# Patient Record
Sex: Female | Born: 1980
Health system: Southern US, Community
[De-identification: ages and names within clinical notes are randomized; demographics above are authoritative.]

## PROBLEM LIST (undated history)

## (undated) ENCOUNTER — Emergency Department (HOSPITAL_BASED_OUTPATIENT_CLINIC_OR_DEPARTMENT_OTHER): Disposition: A | Discharge status: Home / Self Care | Source: Home / Self Care

## (undated) DIAGNOSIS — Z7689 Persons encountering health services in other specified circumstances: Secondary | ICD-10-CM

## (undated) DIAGNOSIS — F419 Anxiety disorder, unspecified: Secondary | ICD-10-CM

## (undated) DIAGNOSIS — F1010 Alcohol abuse, uncomplicated: Secondary | ICD-10-CM

## (undated) DIAGNOSIS — K219 Gastro-esophageal reflux disease without esophagitis: Secondary | ICD-10-CM

## (undated) DIAGNOSIS — F909 Attention-deficit hyperactivity disorder, unspecified type: Secondary | ICD-10-CM

## (undated) DIAGNOSIS — R569 Unspecified convulsions: Secondary | ICD-10-CM

## (undated) DIAGNOSIS — F1910 Other psychoactive substance abuse, uncomplicated: Secondary | ICD-10-CM

## (undated) DIAGNOSIS — F1110 Opioid abuse, uncomplicated: Secondary | ICD-10-CM

## (undated) DIAGNOSIS — T7840XA Allergy, unspecified, initial encounter: Secondary | ICD-10-CM

## (undated) DIAGNOSIS — F32A Depression, unspecified: Secondary | ICD-10-CM

## (undated) DIAGNOSIS — R030 Elevated blood-pressure reading, without diagnosis of hypertension: Secondary | ICD-10-CM

## (undated) DIAGNOSIS — N200 Calculus of kidney: Secondary | ICD-10-CM

## (undated) DIAGNOSIS — F329 Major depressive disorder, single episode, unspecified: Secondary | ICD-10-CM

## (undated) DIAGNOSIS — I10 Essential (primary) hypertension: Secondary | ICD-10-CM

## (undated) DIAGNOSIS — M25562 Pain in left knee: Secondary | ICD-10-CM

## (undated) DIAGNOSIS — F319 Bipolar disorder, unspecified: Secondary | ICD-10-CM

## (undated) DIAGNOSIS — G43909 Migraine, unspecified, not intractable, without status migrainosus: Secondary | ICD-10-CM

## (undated) DIAGNOSIS — IMO0001 Reserved for inherently not codable concepts without codable children: Secondary | ICD-10-CM

## (undated) DIAGNOSIS — M1990 Unspecified osteoarthritis, unspecified site: Secondary | ICD-10-CM

## (undated) DIAGNOSIS — M25561 Pain in right knee: Secondary | ICD-10-CM

## (undated) HISTORY — DX: Persons encountering health services in other specified circumstances: Z76.89

## (undated) HISTORY — DX: Pain in right knee: M25.561

## (undated) HISTORY — PX: FRACTURE SURGERY: SHX138

## (undated) HISTORY — PX: KNEE SURGERY: SHX244

## (undated) HISTORY — DX: Unspecified osteoarthritis, unspecified site: M19.90

## (undated) HISTORY — PX: SHOULDER SURGERY: SHX246

## (undated) HISTORY — DX: Alcohol abuse, uncomplicated: F10.10

## (undated) HISTORY — DX: Migraine, unspecified, not intractable, without status migrainosus: G43.909

## (undated) HISTORY — DX: Major depressive disorder, single episode, unspecified: F32.9

## (undated) HISTORY — DX: Depression, unspecified: F32.A

## (undated) HISTORY — DX: Gastro-esophageal reflux disease without esophagitis: K21.9

## (undated) HISTORY — DX: Other psychoactive substance abuse, uncomplicated: F19.10

## (undated) HISTORY — DX: Elevated blood-pressure reading, without diagnosis of hypertension: R03.0

## (undated) HISTORY — DX: Pain in left knee: M25.562

## (undated) HISTORY — DX: Reserved for inherently not codable concepts without codable children: IMO0001

## (undated) HISTORY — DX: Attention-deficit hyperactivity disorder, unspecified type: F90.9

## (undated) HISTORY — DX: Allergy, unspecified, initial encounter: T78.40XA

## (undated) HISTORY — DX: Bipolar disorder, unspecified: F31.9

## (undated) HISTORY — DX: Anxiety disorder, unspecified: F41.9

---

## 1999-02-02 ENCOUNTER — Encounter: Payer: Self-pay | Admitting: Emergency Medicine

## 1999-02-02 ENCOUNTER — Encounter: Payer: Self-pay | Admitting: Pulmonary Disease

## 1999-02-02 ENCOUNTER — Observation Stay (HOSPITAL_COMMUNITY): Admission: EM | Admit: 1999-02-02 | Discharge: 1999-02-03 | Payer: Self-pay | Admitting: Emergency Medicine

## 1999-02-03 ENCOUNTER — Encounter: Payer: Self-pay | Admitting: Pulmonary Disease

## 1999-10-13 ENCOUNTER — Encounter: Payer: Self-pay | Admitting: Thoracic Surgery (Cardiothoracic Vascular Surgery)

## 1999-10-13 ENCOUNTER — Encounter
Admission: RE | Admit: 1999-10-13 | Discharge: 1999-10-13 | Payer: Self-pay | Admitting: Thoracic Surgery (Cardiothoracic Vascular Surgery)

## 2000-07-05 ENCOUNTER — Other Ambulatory Visit: Admission: RE | Admit: 2000-07-05 | Discharge: 2000-07-05 | Payer: Self-pay | Admitting: Obstetrics and Gynecology

## 2000-08-16 ENCOUNTER — Encounter (INDEPENDENT_AMBULATORY_CARE_PROVIDER_SITE_OTHER): Payer: Self-pay

## 2000-08-16 ENCOUNTER — Other Ambulatory Visit: Admission: RE | Admit: 2000-08-16 | Discharge: 2000-08-16 | Payer: Self-pay | Admitting: Obstetrics and Gynecology

## 2002-01-11 ENCOUNTER — Other Ambulatory Visit: Admission: RE | Admit: 2002-01-11 | Discharge: 2002-01-11 | Payer: Self-pay | Admitting: Obstetrics and Gynecology

## 2003-10-26 ENCOUNTER — Ambulatory Visit (HOSPITAL_BASED_OUTPATIENT_CLINIC_OR_DEPARTMENT_OTHER): Admission: RE | Admit: 2003-10-26 | Discharge: 2003-10-26 | Payer: Self-pay | Admitting: Orthopedic Surgery

## 2004-05-19 ENCOUNTER — Emergency Department (HOSPITAL_COMMUNITY): Admission: EM | Admit: 2004-05-19 | Discharge: 2004-05-19 | Payer: Self-pay | Admitting: Emergency Medicine

## 2004-11-26 ENCOUNTER — Other Ambulatory Visit (HOSPITAL_COMMUNITY): Admission: RE | Admit: 2004-11-26 | Discharge: 2004-12-22 | Payer: Self-pay | Admitting: Psychiatry

## 2004-11-26 ENCOUNTER — Ambulatory Visit: Payer: Self-pay | Admitting: Psychiatry

## 2004-12-23 ENCOUNTER — Other Ambulatory Visit (HOSPITAL_COMMUNITY): Admission: RE | Admit: 2004-12-23 | Discharge: 2005-03-23 | Payer: Self-pay | Admitting: Psychiatry

## 2004-12-26 ENCOUNTER — Emergency Department (HOSPITAL_COMMUNITY): Admission: EM | Admit: 2004-12-26 | Discharge: 2004-12-27 | Payer: Self-pay | Admitting: Emergency Medicine

## 2006-05-02 ENCOUNTER — Emergency Department (HOSPITAL_COMMUNITY): Admission: EM | Admit: 2006-05-02 | Discharge: 2006-05-02 | Payer: Self-pay | Admitting: Emergency Medicine

## 2006-11-16 ENCOUNTER — Emergency Department (HOSPITAL_COMMUNITY): Admission: EM | Admit: 2006-11-16 | Discharge: 2006-11-17 | Payer: Self-pay | Admitting: Emergency Medicine

## 2006-11-27 ENCOUNTER — Ambulatory Visit: Payer: Self-pay | Admitting: Psychiatry

## 2006-11-27 ENCOUNTER — Emergency Department (HOSPITAL_COMMUNITY): Admission: EM | Admit: 2006-11-27 | Discharge: 2006-11-28 | Payer: Self-pay | Admitting: Emergency Medicine

## 2006-11-28 ENCOUNTER — Inpatient Hospital Stay (HOSPITAL_COMMUNITY): Admission: EM | Admit: 2006-11-28 | Discharge: 2006-11-28 | Payer: Self-pay | Admitting: Psychiatry

## 2010-02-03 ENCOUNTER — Encounter: Payer: Self-pay | Admitting: Family Medicine

## 2010-06-17 ENCOUNTER — Ambulatory Visit (HOSPITAL_COMMUNITY): Payer: Self-pay | Admitting: Psychology

## 2010-06-24 ENCOUNTER — Ambulatory Visit (HOSPITAL_COMMUNITY): Payer: Self-pay | Admitting: Psychology

## 2010-07-01 ENCOUNTER — Ambulatory Visit (HOSPITAL_COMMUNITY): Payer: Self-pay | Admitting: Psychology

## 2010-07-09 ENCOUNTER — Ambulatory Visit (HOSPITAL_COMMUNITY): Payer: Self-pay | Admitting: Psychiatry

## 2010-07-10 ENCOUNTER — Ambulatory Visit (HOSPITAL_COMMUNITY): Payer: Self-pay | Admitting: Psychology

## 2010-07-16 ENCOUNTER — Ambulatory Visit (HOSPITAL_COMMUNITY): Payer: Self-pay | Admitting: Psychology

## 2010-07-23 ENCOUNTER — Ambulatory Visit (HOSPITAL_COMMUNITY): Payer: Self-pay | Admitting: Psychology

## 2010-07-30 ENCOUNTER — Ambulatory Visit (HOSPITAL_COMMUNITY): Payer: Self-pay | Admitting: Psychology

## 2010-08-06 ENCOUNTER — Ambulatory Visit (HOSPITAL_COMMUNITY): Payer: Self-pay | Admitting: Psychology

## 2010-08-13 ENCOUNTER — Ambulatory Visit (HOSPITAL_COMMUNITY): Payer: Self-pay | Admitting: Psychology

## 2010-08-20 ENCOUNTER — Ambulatory Visit (HOSPITAL_COMMUNITY): Payer: Self-pay | Admitting: Psychology

## 2010-08-20 ENCOUNTER — Ambulatory Visit: Payer: Self-pay | Admitting: Family Medicine

## 2010-08-20 DIAGNOSIS — F1910 Other psychoactive substance abuse, uncomplicated: Secondary | ICD-10-CM | POA: Insufficient documentation

## 2010-08-20 DIAGNOSIS — M25569 Pain in unspecified knee: Secondary | ICD-10-CM | POA: Insufficient documentation

## 2010-08-20 DIAGNOSIS — R300 Dysuria: Secondary | ICD-10-CM | POA: Insufficient documentation

## 2010-08-20 DIAGNOSIS — F341 Dysthymic disorder: Secondary | ICD-10-CM | POA: Insufficient documentation

## 2010-08-20 DIAGNOSIS — N390 Urinary tract infection, site not specified: Secondary | ICD-10-CM | POA: Insufficient documentation

## 2010-08-20 DIAGNOSIS — R030 Elevated blood-pressure reading, without diagnosis of hypertension: Secondary | ICD-10-CM | POA: Insufficient documentation

## 2010-08-20 DIAGNOSIS — F909 Attention-deficit hyperactivity disorder, unspecified type: Secondary | ICD-10-CM | POA: Insufficient documentation

## 2010-08-20 LAB — CONVERTED CEMR LAB
Bilirubin Urine: NEGATIVE
Epithelial cells, urine: >20 /lpf
Glucose, Urine, Semiquant: NEGATIVE
Ketones, urine, test strip: NEGATIVE
Nitrite: POSITIVE
RBC / HPF: >20
Specific Gravity, Urine: >=1.03
Urobilinogen, UA: 0.2
pH: 5.5

## 2010-08-21 ENCOUNTER — Telehealth (INDEPENDENT_AMBULATORY_CARE_PROVIDER_SITE_OTHER): Payer: Self-pay | Admitting: *Deleted

## 2010-08-22 ENCOUNTER — Telehealth: Payer: Self-pay | Admitting: Family Medicine

## 2010-08-22 ENCOUNTER — Telehealth (INDEPENDENT_AMBULATORY_CARE_PROVIDER_SITE_OTHER): Payer: Self-pay | Admitting: *Deleted

## 2010-08-27 ENCOUNTER — Ambulatory Visit (HOSPITAL_COMMUNITY): Payer: Self-pay | Admitting: Psychology

## 2010-09-03 ENCOUNTER — Ambulatory Visit (HOSPITAL_COMMUNITY): Payer: Self-pay | Admitting: Psychology

## 2010-09-10 ENCOUNTER — Ambulatory Visit (HOSPITAL_COMMUNITY): Payer: Self-pay | Admitting: Psychology

## 2010-09-18 ENCOUNTER — Ambulatory Visit (HOSPITAL_COMMUNITY): Payer: Self-pay | Admitting: Psychology

## 2010-10-23 ENCOUNTER — Ambulatory Visit (HOSPITAL_COMMUNITY): Payer: Self-pay | Admitting: Psychology

## 2010-10-23 ENCOUNTER — Ambulatory Visit (HOSPITAL_COMMUNITY)
Admission: RE | Admit: 2010-10-23 | Discharge: 2010-10-23 | Payer: Self-pay | Source: Home / Self Care | Admitting: Psychiatry

## 2010-10-27 ENCOUNTER — Other Ambulatory Visit (HOSPITAL_COMMUNITY)
Admission: RE | Admit: 2010-10-27 | Discharge: 2010-12-15 | Payer: Self-pay | Source: Home / Self Care | Attending: Psychiatry | Admitting: Psychiatry

## 2010-11-25 ENCOUNTER — Ambulatory Visit (HOSPITAL_COMMUNITY): Payer: Self-pay | Admitting: Psychiatry

## 2010-12-18 ENCOUNTER — Ambulatory Visit (HOSPITAL_COMMUNITY)
Admission: RE | Admit: 2010-12-18 | Discharge: 2010-12-18 | Payer: Self-pay | Source: Home / Self Care | Attending: Psychology | Admitting: Psychology

## 2010-12-25 ENCOUNTER — Ambulatory Visit (HOSPITAL_COMMUNITY)
Admission: RE | Admit: 2010-12-25 | Discharge: 2010-12-25 | Payer: Self-pay | Source: Home / Self Care | Attending: Psychology | Admitting: Psychology

## 2011-01-01 ENCOUNTER — Ambulatory Visit (HOSPITAL_COMMUNITY): Admit: 2011-01-01 | Payer: Self-pay | Admitting: Psychology

## 2011-01-06 ENCOUNTER — Ambulatory Visit
Admission: RE | Admit: 2011-01-06 | Discharge: 2011-01-06 | Payer: Self-pay | Source: Home / Self Care | Attending: Family Medicine | Admitting: Family Medicine

## 2011-01-06 LAB — CONVERTED CEMR LAB
Glucose, Urine, Semiquant: NEGATIVE
Ketones, urine, test strip: NEGATIVE
Specific Gravity, Urine: >=1.03
WBC Urine, dipstick: NEGATIVE
pH: 5.5

## 2011-01-06 NOTE — Progress Notes (Signed)
  Phone Note Call from Patient   Caller: Patient Call For: [phone] Summary of Call: Hasn't had a bm in 2 wks.  Took Citric Magnesium and still haven't had any results.  Call [phone]  at home or cell number above. Initial call taken by: Britta Mccreedy mcgregor  Follow-up for Phone Call        eats a fiber bar each day. hx of constipation & not going regularly. told her she needed more fiber. may take miralx or similar product 3 times today. she states she is going to take another laxative. states she is cramping.  discussed long term efforts to prevent this. eat more fruits & veggies. fiber daily, along with her fiber bar. increase activity & water intake asked her to call me if no activity by 3 today Follow-up by: Golden Circle RN,  August 22, 2010 8:50 AM

## 2011-01-06 NOTE — Assessment & Plan Note (Signed)
Summary: NP,tcb   Vital Signs:  Patient profile:   30 year old female Height:      67 inches Weight:      145 pounds BMI:     22.79 Temp:     99.2 degrees F oral Pulse rate:   76 / minute BP sitting:   144 / 78  (left arm) Cuff size:   large  Vitals Entered By: Tessie Fass CMA (August 20, 2010 10:37 AM) CC: NEW PT Is Patient Diabetic? No Pain Assessment Patient in pain? yes     Location: bilateral knee Intensity: 5   Primary Care Provider:  Helane Rima DO  CC:  NEW PT.  History of Present Illness: 30 yo F. Today, she would like to discuss:  1. Knee Pain: Bilateral, chronic, PMhx patellofemoral syndrome, was seen by [facility] 03/2010 - xrays with no other changes, has PT exercises that she does intermittently. States that she was previously on Celexa, but told to stop 2/2 HTN and then Vicodin per her previous PCP for hs pain.  2. BP: Has been borderline for "a long time." Believes that it is 2/2 weight gain. She has not been exercising or eating a healthy diet. She was Rx Clonidine for this and to also help with anxiety. She denies HA, vision changes, CP, SOB, LE edema, rash.  3. Anxiety: Previously followed by Dr. Evelene Croon. Endorses Rx [employer]. States that she has one more Rx that she can fill before running out. She works with a Paramedic, Furniture conservator/restorer, weekly at [employer]. She denies SI/HI.  4. Dysuria: Associated with dark, foul-smelling urine x a couple of weeks. Denies fever/chills, N/V, abdominal pain, back pain.  Habits & Providers  Alcohol-Tobacco-Diet     Tobacco Status: current     Tobacco Counseling: to quit use of tobacco products     Cigarette Packs/Day: <0.25  Current Medications (verified): 1)  Tramadol Hcl 50 Mg  Tabs (Tramadol Hcl) .... One By Mouth Two Times A Day As Needed For Knee Pain 2)  Cephalexin 500 Mg  Tabs (Cephalexin) .... Take One By Mouth Two Times A Day X 3 Days 3)  Alprazolam 0.5 Mg Tabs (Alprazolam) .... One By Mouth Two Times A Day - Prescribed  Previously By Dr. Evelene Croon  Allergies (verified): No Known Drug Allergies  Past History:  Past Medical History: Psychiatric - Anxiety/Depression, ADHD, ? Bipolar Disorder   - Previously followed by Dr. Evelene Croon   - Records Reviewed - patient confirmed to be taking Xanax 0.5 mg by mouth two times a day    - Other medications Rx by Dr. Evelene Croon at time of last visit: Saphris 5 mg by mouth daily, Benztropine 1 mg by mouth daily, and Vyvanse 70 mg by mouth daily. Patient not taking any of these medications at time of first OV.    - Previous [facility] H&P notes PMHx Schizophrenia, though patient denies this. Borderline HTN Polysubstance Abuse    - ETOH, cocaine, THC, heroin, narcotics Previous PCP - Dr. Duaine Dredge Migraines Chronic Bilateral Knee Pain    - patellofemoral syndrome    - was [facility] by [facility]  Past Surgical History: Hx grade 4 AC separation Hx 8th rib fx  Family History: Mother is 52 - alive and well. Father is 49 - alive and well.  Social History: Lives in [place], alone. No pets. Endorses a healthy diet and intermittent exercise. 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 High school graduate. Single. Smokes 1-2 cigarettes  per day. Denies recreational drug use. Denies ETOH use. Smoking Status:  current Packs/Day:  <0.25  Review of Systems      See HPI  Physical Exam  General:  Well-developed, well-nourished, in no acute distress; alert, appropriate and cooperative throughout examination. Vitals reviewed. Psych:  Oriented X3, memory intact for recent and remote, good eye contact, and moderately anxious.     Impression & Recommendations:  Problem # 1:  PATELLO-FEMORAL SYNDROME (ICD-719.46) Assessment Unchanged Chronic issue. Tylenol not working. No NSAIDs today 2/2 BP. Patient requested narcotic as prescribed by previous PCP. Will Rx Ultram today. Will request records from previous PCP and [facility]. Her updated medication list for this problem includes:    Tramadol Hcl  50 Mg Tabs (Tramadol hcl) ..... One by mouth two times a day as needed for knee pain  Problem # 2:  ELEVATED BLOOD PRESSURE WITHOUT DIAGNOSIS OF HYPERTENSION (ICD-796.2) Assessment: Unchanged Patient does not wish to continue Clonidine. She does not think that it has helped her ADHD. I agree that it is not a good choice if we are only using it for HTN now. Patient given instructions to wean. Will follow up in 1-2 weeks for recheck of BP.  Problem # 3:  ANXIETY DEPRESSION (ICD-300.4) Assessment: Unchanged Patient with significant psychiatric Hx. Will request records from Dr. Evelene Croon.  Problem # 4:  DYSURIA (ICD-788.1) Assessment: New  Her updated medication list for this problem includes:    Cephalexin 500 Mg Tabs (Cephalexin) .Marland Kitchen... Take one by mouth two times a day x 3 days  Problem # 5:  UTI (ICD-599.0) Assessment: New  Her updated medication list for this problem includes:    Cephalexin 500 Mg Tabs (Cephalexin) .Marland Kitchen... Take one by mouth two times a day x 3 days  Problem # 6:  SUBSTANCE ABUSE, MULTIPLE (ICD-305.90) Assessment: Improved Long Hx of plysubstance abuse - see PMHx. Will monitor for signs of relapse or abuse of prescribed medications.  Complete Medication List: 1)  Tramadol Hcl 50 Mg Tabs (Tramadol hcl) .... One by mouth two times a day as needed for knee pain 2)  Cephalexin 500 Mg Tabs (Cephalexin) .... Take one by mouth two times a day x 3 days 3)  Alprazolam 0.5 Mg Tabs (Alprazolam) .... One by mouth two times a day - prescribed previously by dr. Evelene Croon  Other Orders: Urinalysis-[facility] (00000)  Patient Instructions: 1)  It was very nice to meet you today! 2)  We are requesting records from your previous doctors. 3)  Follow up in 1 month or less. 4)  To wean Clonidine: Decrease to 1/2 tab two times a day x 5 days, then 1/2 tab by mouth daily x 5 days, then off. Prescriptions: TRAMADOL HCL 50 MG  TABS (TRAMADOL HCL) one by mouth two times a day as needed for knee pain  #60  x 0   Entered and Authorized by:   Helane Rima DO   Signed by:   Helane Rima DO on 08/20/2010   Method used:   Electronically to        [facility]  [phone]* (retail)       [address] [place], [place]  [address]       Ph: [phone] or [phone]       Fax: [phone]   RxID:   [id number]   Laboratory Results   Urine Tests  Date/Time Received: August 20, 2010 11:26 AM  Date/Time Reported: August 20, 2010 12:07  PM   Routine Urinalysis   Color: yellow Appearance: Cloudy Glucose: negative   (Normal Range: Negative) Bilirubin: negative   (Normal Range: Negative) Ketone: negative   (Normal Range: Negative) Spec. Gravity: >=1.030   (Normal Range: 1.003-1.035) Blood: moderate   (Normal Range: Negative) pH: 5.5   (Normal Range: 5.0-8.0) Protein: trace   (Normal Range: Negative) Urobilinogen: 0.2   (Normal Range: 0-1) Nitrite: positive   (Normal Range: Negative) Leukocyte Esterace: large   (Normal Range: Negative)  Urine Microscopic WBC/HPF: LOADED RBC/HPF: >20 Bacteria/HPF: LOADED Epithelial/HPF: >20    Comments: ...............test performed by......Marland KitchenBonnie A. [place]azil[place], MLS (ASCP)cm      Prevention & Chronic Care Immunizations   Influenza vaccine: Not documented   Influenza vaccine deferral: Deferred  (08/20/2010)    Tetanus booster: Not documented    Pneumococcal vaccine: Not documented  Other Screening   Pap smear: Not documented   Pap smear due: 07/08/2011   Smoking status: current  (08/20/2010)   Smoking cessation counseling: yes  (08/20/2010)

## 2011-01-06 NOTE — Consult Note (Signed)
Summary: [facility]  [facility]   Imported By: Clydell Hakim 08/28/2010 15:41:47  _____________________________________________________________________  External Attachment:    Type:   Image     Comment:   External Document

## 2011-01-06 NOTE — Progress Notes (Signed)
  Phone Note Call from Patient   Call For: [phone] Summary of Call: Would like to have to have lab results.  Was told yesterday they would be ready today.  Please call asap.   Initial call taken by: Britta Mccreedy mcgregor  Follow-up for Phone Call        will forward  to MD. Follow-up by: Theresia Lo RN,  August 21, 2010 11:46 AM  Additional Follow-up for Phone Call Additional follow up Details #1::        please call patient to let her know that her urine is consistent with a urinary tract infection - i sent cephalexin to the pharmacy. Additional Follow-up by: Helane Rima DO,  August 21, 2010 4:07 PM    Additional Follow-up for Phone Call Additional follow up Details #2::    message left on voicemail to call tomorrow AM. Follow-up by: Theresia Lo RN,  August 21, 2010 4:57 PM  New/Updated Medications: CEPHALEXIN 500 MG  TABS (CEPHALEXIN) take one by mouth two times a day x 3 days Prescriptions: CEPHALEXIN 500 MG  TABS (CEPHALEXIN) take one by mouth two times a day x 3 days  #6 x 0   Entered by:   Helane Rima DO   Authorized by:   . RN TEAM-[facility]   Signed by:   Helane Rima DO on 08/21/2010   Method used:   Telephoned to ...         RxID:   [id number]   patient notified . Theresia Lo RN  August 22, 2010 9:56 AM

## 2011-01-06 NOTE — Progress Notes (Signed)
Summary: Rx  Phone Note Call from Patient Call back at Home Phone [phone]   Reason for Call: Talk to Nurse Summary of Call: pt sts the Rx for tramadol is still not at the pharmacy? Initial call taken by: Knox Royalty,  August 22, 2010 1:37 PM  Follow-up for Phone Call        patient is asking about RX for antibiotic. it was not recived at pharmacy. Rx called to pharmacy and patient notified. Follow-up by: Theresia Lo RN,  August 22, 2010 2:11 PM

## 2011-01-10 ENCOUNTER — Encounter: Payer: Self-pay | Admitting: *Deleted

## 2011-01-12 ENCOUNTER — Encounter (HOSPITAL_COMMUNITY): Payer: BC Managed Care – PPO | Admitting: Psychology

## 2011-01-12 DIAGNOSIS — F39 Unspecified mood [affective] disorder: Secondary | ICD-10-CM

## 2011-01-14 NOTE — Assessment & Plan Note (Signed)
Summary: possible kidney infection?/eo   Vital Signs:  Patient profile:   30 year old female Height:      67 inches Weight:      198 pounds BMI:     31.12 Temp:     98.2 degrees F oral Pulse rate:   79 / minute BP sitting:   121 / 79  (left arm) Cuff size:   regular  Vitals Entered By: Tessie Fass CMA (January 06, 2011 2:26 PM) CC: dysuria, lower abdominal and back pain x 2 days Pain Assessment Patient in pain? yes     Location: lower back, abdomen Intensity: 7   Primary Care Provider:  Helane Rima DO  CC:  dysuria and lower abdominal and back pain x 2 days.  History of Present Illness: Urine dark and has an odor.  Burns a little when she voids.  Very poor historian and almost got angry when questions were asked.  Allergies: No Known Drug Allergies  Review of Systems General:  Denies chills and fever. GI:  Complains of abdominal pain. GU:  Complains of dysuria; denies urinary frequency and urinary hesitancy.  Physical Exam  General:  Depressed appearing Abdomen:  soft and non-tender.  No CVA tenderness   Impression & Recommendations:  Problem # 1:  DYSURIA (ICD-788.1) Patient became very upset when I expalined that her urine was concentrated and no signs of infection.  It was not a good sample but her WBC were rare.  She said "I paid $25 to find out I need to drink more water"  then asked for a work note for leaving work today and left without instructions. Orders: Urinalysis-[facility] (00000) [facility]- Est Level  2 (74259)  Complete Medication List: 1)  Tramadol Hcl 50 Mg Tabs (Tramadol hcl) .... One by mouth two times a day as needed for knee pain 2)  Alprazolam 0.5 Mg Tabs (Alprazolam) .... One by mouth two times a day - prescribed previously by dr. Evelene Croon   Orders Added: 1)  Urinalysis-[facility] [00000] 2)  [facility]- Est Level  2 [99212]    Laboratory Results   Urine Tests  Date/Time Received: January 06, 2011 2:28 PM  Date/Time Reported: January 06, 2011 3:24  PM   Routine Urinalysis   Color: yellow Appearance: Clear Glucose: negative   (Normal Range: Negative) Bilirubin: negative   (Normal Range: Negative) Ketone: negative   (Normal Range: Negative) Spec. Gravity: >=1.030   (Normal Range: 1.003-1.035) Blood: small   (Normal Range: Negative) pH: 5.5   (Normal Range: 5.0-8.0) Protein: negative   (Normal Range: Negative) Urobilinogen: 0.2   (Normal Range: 0-1) Nitrite: negative   (Normal Range: Negative) Leukocyte Esterace: negative   (Normal Range: Negative)  Urine Microscopic WBC/HPF: 4-8 RBC/HPF: 0-3 Bacteria/HPF: 2+ Epithelial/HPF: >20    Comments: ...............test performed by......Marland KitchenBonnie A. [place], MLS (ASCP)cm

## 2011-01-20 ENCOUNTER — Encounter (HOSPITAL_COMMUNITY): Payer: BC Managed Care – PPO | Admitting: Psychology

## 2011-01-27 ENCOUNTER — Encounter (HOSPITAL_COMMUNITY): Payer: BC Managed Care – PPO | Admitting: Psychology

## 2011-02-04 ENCOUNTER — Encounter (HOSPITAL_COMMUNITY): Payer: BC Managed Care – PPO | Admitting: Psychology

## 2011-02-04 DIAGNOSIS — F3132 Bipolar disorder, current episode depressed, moderate: Secondary | ICD-10-CM

## 2011-02-04 DIAGNOSIS — F1120 Opioid dependence, uncomplicated: Secondary | ICD-10-CM

## 2011-02-08 ENCOUNTER — Emergency Department (HOSPITAL_COMMUNITY)
Admission: EM | Admit: 2011-02-08 | Discharge: 2011-02-08 | Disposition: A | Discharge status: Home / Self Care | Payer: BC Managed Care – PPO | Attending: Emergency Medicine | Admitting: Emergency Medicine

## 2011-02-08 DIAGNOSIS — Z9889 Other specified postprocedural states: Secondary | ICD-10-CM | POA: Insufficient documentation

## 2011-02-08 DIAGNOSIS — I10 Essential (primary) hypertension: Secondary | ICD-10-CM | POA: Insufficient documentation

## 2011-02-08 DIAGNOSIS — M25569 Pain in unspecified knee: Secondary | ICD-10-CM | POA: Insufficient documentation

## 2011-02-11 ENCOUNTER — Encounter (HOSPITAL_COMMUNITY): Payer: BC Managed Care – PPO | Admitting: Psychology

## 2011-02-11 DIAGNOSIS — F3132 Bipolar disorder, current episode depressed, moderate: Secondary | ICD-10-CM

## 2011-02-11 DIAGNOSIS — F1120 Opioid dependence, uncomplicated: Secondary | ICD-10-CM

## 2011-02-13 ENCOUNTER — Other Ambulatory Visit: Payer: Self-pay | Admitting: Orthopedic Surgery

## 2011-02-13 ENCOUNTER — Ambulatory Visit
Admission: RE | Admit: 2011-02-13 | Discharge: 2011-02-13 | Disposition: A | Discharge status: Home / Self Care | Payer: BC Managed Care – PPO | Source: Ambulatory Visit | Attending: Orthopedic Surgery | Admitting: Orthopedic Surgery

## 2011-02-13 DIAGNOSIS — M25561 Pain in right knee: Secondary | ICD-10-CM

## 2011-02-16 LAB — URINE DRUGS OF ABUSE SCREEN W ALC, ROUTINE (REF LAB)
Amphetamine Screen, Ur: NEGATIVE
Amphetamine Screen, Ur: NEGATIVE
Barbiturate Quant, Ur: NEGATIVE
Barbiturate Quant, Ur: NEGATIVE
Benzodiazepines.: NEGATIVE
Benzodiazepines.: NEGATIVE
Benzodiazepines.: NEGATIVE
Benzodiazepines.: NEGATIVE
Marijuana Metabolite: NEGATIVE
Marijuana Metabolite: NEGATIVE
Marijuana Metabolite: NEGATIVE
Marijuana Metabolite: NEGATIVE
Methadone: NEGATIVE
Methadone: NEGATIVE
Methadone: NEGATIVE
Methadone: NEGATIVE
Opiate Screen, Urine: NEGATIVE
Opiate Screen, Urine: NEGATIVE
Opiate Screen, Urine: NEGATIVE
Propoxyphene: NEGATIVE
Propoxyphene: NEGATIVE
Propoxyphene: NEGATIVE
Propoxyphene: NEGATIVE

## 2011-02-17 ENCOUNTER — Other Ambulatory Visit: Payer: Self-pay | Admitting: Orthopedic Surgery

## 2011-02-17 DIAGNOSIS — M25569 Pain in unspecified knee: Secondary | ICD-10-CM

## 2011-02-17 LAB — URINE DRUGS OF ABUSE SCREEN W ALC, ROUTINE (REF LAB)
Amphetamine Screen, Ur: NEGATIVE
Amphetamine Screen, Ur: NEGATIVE
Barbiturate Quant, Ur: NEGATIVE
Barbiturate Quant, Ur: NEGATIVE
Benzodiazepines.: NEGATIVE
Benzodiazepines.: NEGATIVE
Ethyl Alcohol: <10 mg/dL (ref <10)
Marijuana Metabolite: NEGATIVE
Marijuana Metabolite: NEGATIVE
Methadone: NEGATIVE
Opiate Screen, Urine: NEGATIVE
Opiate Screen, Urine: NEGATIVE
Propoxyphene: NEGATIVE
Propoxyphene: NEGATIVE

## 2011-02-18 ENCOUNTER — Encounter (HOSPITAL_COMMUNITY): Payer: BC Managed Care – PPO | Admitting: Psychology

## 2011-02-18 DIAGNOSIS — F1120 Opioid dependence, uncomplicated: Secondary | ICD-10-CM

## 2011-02-18 DIAGNOSIS — F3130 Bipolar disorder, current episode depressed, mild or moderate severity, unspecified: Secondary | ICD-10-CM

## 2011-02-19 ENCOUNTER — Ambulatory Visit
Admission: RE | Admit: 2011-02-19 | Discharge: 2011-02-19 | Disposition: A | Discharge status: Home / Self Care | Payer: BC Managed Care – PPO | Source: Ambulatory Visit | Attending: Orthopedic Surgery | Admitting: Orthopedic Surgery

## 2011-02-19 DIAGNOSIS — M25569 Pain in unspecified knee: Secondary | ICD-10-CM

## 2011-02-25 ENCOUNTER — Encounter (HOSPITAL_COMMUNITY): Payer: BC Managed Care – PPO | Admitting: Psychology

## 2011-03-04 ENCOUNTER — Encounter (HOSPITAL_COMMUNITY): Payer: BC Managed Care – PPO | Admitting: Psychology

## 2011-03-11 ENCOUNTER — Encounter (HOSPITAL_COMMUNITY): Payer: BC Managed Care – PPO | Admitting: Psychology

## 2011-03-11 DIAGNOSIS — F319 Bipolar disorder, unspecified: Secondary | ICD-10-CM

## 2011-03-18 ENCOUNTER — Encounter (HOSPITAL_COMMUNITY): Payer: BC Managed Care – PPO | Admitting: Psychology

## 2011-03-25 ENCOUNTER — Encounter (HOSPITAL_COMMUNITY): Payer: BC Managed Care – PPO | Admitting: Psychology

## 2011-03-25 DIAGNOSIS — F1120 Opioid dependence, uncomplicated: Secondary | ICD-10-CM

## 2011-03-25 DIAGNOSIS — F3112 Bipolar disorder, current episode manic without psychotic features, moderate: Secondary | ICD-10-CM

## 2011-04-01 ENCOUNTER — Encounter (HOSPITAL_COMMUNITY): Payer: BC Managed Care – PPO | Admitting: Psychology

## 2011-04-01 DIAGNOSIS — F1120 Opioid dependence, uncomplicated: Secondary | ICD-10-CM

## 2011-04-01 DIAGNOSIS — F319 Bipolar disorder, unspecified: Secondary | ICD-10-CM

## 2011-04-08 ENCOUNTER — Encounter (HOSPITAL_COMMUNITY): Payer: BC Managed Care – PPO | Admitting: Psychology

## 2011-04-08 DIAGNOSIS — F1120 Opioid dependence, uncomplicated: Secondary | ICD-10-CM

## 2011-04-08 DIAGNOSIS — F39 Unspecified mood [affective] disorder: Secondary | ICD-10-CM

## 2011-04-15 ENCOUNTER — Encounter (HOSPITAL_COMMUNITY): Payer: BC Managed Care – PPO | Admitting: Psychology

## 2011-04-15 DIAGNOSIS — F1120 Opioid dependence, uncomplicated: Secondary | ICD-10-CM

## 2011-04-15 DIAGNOSIS — F319 Bipolar disorder, unspecified: Secondary | ICD-10-CM

## 2011-04-17 ENCOUNTER — Encounter (HOSPITAL_COMMUNITY): Payer: BC Managed Care – PPO | Admitting: Physician Assistant

## 2011-04-21 ENCOUNTER — Other Ambulatory Visit (HOSPITAL_COMMUNITY)
Admission: RE | Admit: 2011-04-21 | Discharge: 2011-04-21 | Disposition: A | Discharge status: Home / Self Care | Payer: BC Managed Care – PPO | Source: Ambulatory Visit | Attending: Family Medicine | Admitting: Family Medicine

## 2011-04-21 ENCOUNTER — Ambulatory Visit (INDEPENDENT_AMBULATORY_CARE_PROVIDER_SITE_OTHER): Payer: BC Managed Care – PPO | Admitting: Family Medicine

## 2011-04-21 ENCOUNTER — Encounter: Payer: Self-pay | Admitting: Family Medicine

## 2011-04-21 DIAGNOSIS — G5603 Carpal tunnel syndrome, bilateral upper limbs: Secondary | ICD-10-CM | POA: Insufficient documentation

## 2011-04-21 DIAGNOSIS — Z124 Encounter for screening for malignant neoplasm of cervix: Secondary | ICD-10-CM

## 2011-04-21 DIAGNOSIS — N760 Acute vaginitis: Secondary | ICD-10-CM | POA: Insufficient documentation

## 2011-04-21 DIAGNOSIS — Z01419 Encounter for gynecological examination (general) (routine) without abnormal findings: Secondary | ICD-10-CM | POA: Insufficient documentation

## 2011-04-21 DIAGNOSIS — G5600 Carpal tunnel syndrome, unspecified upper limb: Secondary | ICD-10-CM

## 2011-04-21 LAB — POCT WET PREP (WET MOUNT): Trichomonas Wet Prep HPF POC: NEGATIVE

## 2011-04-21 NOTE — Patient Instructions (Signed)
Carpal Tunnel - Use the wrist splints every night. Do the stretching exercise that I demonstrated. Take Motrin 600 mg by mouth three times daily (with food). Apply ice for 10 minutes three times daily.  Carpal Tunnel Syndrome The carpal tunnel is a narrow hollow area in the wrist. It is formed by the wrist bones and ligaments. Nerves, blood vessels, and tendons (cord like structures which attach muscle to bone) on the palm side (the side of your hand in the direction your fingers bend) of your hand pass through the carpal tunnel. Repeated wrist motion or certain diseases may cause swelling within the tunnel. (That is why these are called repetitive trauma (damage caused by over use) disorders. It is also a common problem in late pregnancy.) This swelling pinches the main nerve in the wrist (median nerve) and causes the painful condition called carpal tunnel syndrome. A feeling of "pins and needles" may be noticed in the fingers or hand; however, the entire arm may ache from this condition. Carpal tunnel syndrome may clear up by itself. Cortisone injections may help. Sometimes, an operation may be needed to free the pinched nerve. An electromyogram (a type of test) may be needed to confirm this diagnosis (learning what is wrong). This is a test which measures nerve conduction. The nerve conduction is usually slowed in a carpal tunnel syndrome. HOME CARE INSTRUCTIONS  If your caregiver prescribed medication to help reduce swelling, take as directed.   If you were given a splint to keep your wrist from bending, use it as instructed. It is important to wear the splint at night. Use the splint for as long as you have pain or numbness in your hand, arm or wrist. This may take 1 to 2 months.   If you have pain at night, it may help to rub or shake your hand, or elevate your hand above the level of your heart (the center of your chest).   It is important to give your wrist a rest by stopping the activities  that are causing the problem. If your symptoms (problems) are work-related, you may need to talk to your employer about changing to a job that does not require using your wrist.   Only take over-the-counter or prescription medicines for pain, discomfort, or fever as directed by your caregiver.   Following periods of extended use, particularly strenuous use, apply an ice pack wrapped in a towel to the anterior (palm) side of the affected wrist for 20 to 30 minutes. Repeat as needed three to four times per day. This will help reduce the swelling.   Follow all instructions for follow-up with your caregiver. This includes any orthopedic referrals, physical therapy, and rehabilitation. Any delay in obtaining necessary care could result in a delay or failure of your condition to heal.  SEEK IMMEDIATE MEDICAL CARE IF:  You are still having pain and numbness following a week of treatment.   You develop new, unexplained symptoms.   Your current symptoms are getting worse and are not helped or controlled with medications.  MAKE SURE YOU:   Understand these instructions.   Will watch your condition.   Will get help right away if you are not doing well or get worse.  Document Released: 11/20/2000 Document Re-Released: 02/19/2009 [facility] Patient Information 2011 [place], [place].

## 2011-04-21 NOTE — Assessment & Plan Note (Signed)
Discussed Dx, Tx, and future prevention.

## 2011-04-21 NOTE — Progress Notes (Signed)
  Subjective:    Patient ID: [name], female    DOB: January 17, 1981, 30 y.o.   MRN: [id number]  HPI  1. Vaginal DC: White, x several weeks, with odor, no abdominal pain/dysuria/back pain/fever.  2. Wrist/Hand Numbness/Tingling: x several months, worse at night - wakes her, bilateral, never happened before, no point pain, no weakness or dropping items, not taking anything to treat.  Review of Systems SEE HPI.    Objective:   Physical Exam  Vitals reviewed. Constitutional: She appears well-developed and well-nourished. No distress.  Abdominal: Soft. Bowel sounds are normal. She exhibits no mass.  Genitourinary: Uterus normal. Vaginal discharge found.  Musculoskeletal:       + Tinels and Phalen bilaterally. No thenar atrophy. No weakness. Normal pulses.      Assessment & Plan:

## 2011-04-21 NOTE — Assessment & Plan Note (Signed)
Wet prep, GC/CH, PAP done today. Recent HIV negative. Not sexually active now, but previously using no protection. LMP 3 weeks ago.

## 2011-04-22 ENCOUNTER — Telehealth: Payer: Self-pay | Admitting: Family Medicine

## 2011-04-22 ENCOUNTER — Telehealth: Payer: Self-pay | Admitting: *Deleted

## 2011-04-22 ENCOUNTER — Encounter (HOSPITAL_COMMUNITY): Payer: BC Managed Care – PPO | Admitting: Psychology

## 2011-04-22 DIAGNOSIS — F319 Bipolar disorder, unspecified: Secondary | ICD-10-CM

## 2011-04-22 DIAGNOSIS — F1120 Opioid dependence, uncomplicated: Secondary | ICD-10-CM

## 2011-04-22 LAB — GC/CHLAMYDIA PROBE AMP, GENITAL: Chlamydia, DNA Probe: NEGATIVE

## 2011-04-22 MED ORDER — METRONIDAZOLE 0.75 % VA GEL: 1.0000 | Freq: Two times a day (BID) | VAGINAL | Status: AC

## 2011-04-22 MED ORDER — METRONIDAZOLE 500 MG PO TABS: 500.0000 mg | ORAL_TABLET | Freq: Two times a day (BID) | ORAL | Status: AC

## 2011-04-22 NOTE — Telephone Encounter (Signed)
Spoke with patient and informed her of lab results. Says she has taken the flagyl before with no success. Patient is requesting something else to be called in that she was prescribed by another MD, she thinks it was T-Gel. Will forward to wallace for review.Legacy Carrender, Rodena Medin

## 2011-04-22 NOTE — Telephone Encounter (Signed)
Message copied by Arlyss Repress on Wed Apr 22, 2011 11:21 AM ------      Message from: Helane Rima      Created: Wed Apr 22, 2011 10:22 AM       Cell Phone: [phone]            Please call patient to let her know that her labs show bacterial vaginosis only - no STD. I sent Flagyl to her pharmacy.

## 2011-04-22 NOTE — Telephone Encounter (Signed)
Rx Metrogel sent to pharmacy

## 2011-04-22 NOTE — Telephone Encounter (Signed)
Called pt. lmvm to call back. Please tell pt, we do not have her labs back yet, but will inform her of labs as soon as we have the results. Lorenda Hatchet, Renato Battles

## 2011-04-22 NOTE — Telephone Encounter (Signed)
Called pt and lmvm to call us back. Waiting for pt to call back to inform of results. Lorenda Hatchet, Renato Battles

## 2011-04-22 NOTE — Progress Notes (Signed)
Addended by: Helane Rima on: 04/22/2011 10:21 AM   Modules accepted: Orders

## 2011-04-22 NOTE — Telephone Encounter (Signed)
Wants to know lab results

## 2011-04-24 ENCOUNTER — Encounter: Payer: Self-pay | Admitting: Family Medicine

## 2011-04-24 NOTE — Op Note (Signed)
NAME:  [name], [name]                        ACCOUNT NO.:  [id number]   MEDICAL RECORD NO.:  [id number]                   PATIENT TYPE:  AMB   LOCATION:  DSC                                  FACILITY:  [facility]   PHYSICIAN:  Feliberto Gottron. Turner Daniels, M.D.                DATE OF BIRTH:  1981/08/12   DATE OF PROCEDURE:  10/26/2003  DATE OF DISCHARGE:                                 OPERATIVE REPORT   PREOPERATIVE DIAGNOSES:  Acute grade 4 left acromioclavicular separation.   POSTOPERATIVE DIAGNOSES:  Chronic grade 4 left acromioclavicular separation.   OPERATION PERFORMED:  Left shoulder anterior cruciate ligament  reconstruction and distal clavicle excision.   SURGEON:  Feliberto Gottron. Turner Daniels, M.D.   ASSISTANTLaural Benes. Jannet Mantis.   ANESTHESIA:  General endotracheal.   ESTIMATED BLOOD LOSS:  100 mL.   FLUIDS REPLACED:  1 L of crystalloid.   DRAINS:  None.   TOURNIQUET TIME:  None.   INDICATIONS FOR PROCEDURE:  The patient is a 30 year old [employer] student who presented to our office a few days ago reported that  she had an eight-day history of a left acromioclavicular separation where  the collar bone had gone superiorly and posterior to the scapula.  She was  on Percocet 10 mg every four to six hours as needed for pain and was taking  one or two of them at a time because of pain and because of this, we went  ahead and set her up for acromioclavicular joint repair based on her history  of an acute injury.   DESCRIPTION OF PROCEDURE:  The patient was taken to the operating room at  [facility] where appropriate anesthetic monitors are  attached and general endotracheal anesthesia induced with the patient in the  supine position.  The patient was then placed in the beach chair position.  The left upper extremity was prepped and draped in the usual sterile fashion  from the wrist to the hemithorax.  The skin from the tip of the coracoid  region going  superiorly over the distal aspect of the clavicle was  infiltrated with 10 mL of 0.5% Marcaine with epinephrine solution and a  saber-like incision going from the posterior distal edge of the clavicle  inferiorly towards the tip of the coracoid was made about 6 to 7 cm in  length through the skin and subcutaneous tissue.  Small bleeders were  identified and cauterized and we made a longitudinal split in the fascia  over the clavicle and the deltoid muscle exposing the subdeltoid anterior  region of the shoulder down to the tip of the coracoid as well as the distal  clavicle and then using a periosteal elevator we stripped periosteum off the  distal three-quarters to one inch of the clavicle.  We immediately  encountered some heterotopic bone.  We encountered very little blood clot  and this  had more the appearance of a chronic AC separation, at least three  or four months in length and this was later confirmed by her father.  He  also confirmed that she had a history of narcotics abuse at this time. We  went ahead and took a right angle Kocher, passed it beneath the coracoid and  passed four strands of #5 FiberWire underneath the coracoid.  Using a power  saw, we excised the distal half inch of the clavicle and then drilled 2/8  inch holes in the clavicle about a half inch apart along the axis of the  clavicle and passed the FiberWire through it and then used this to tie the  scapula back up to the shoulder blade and create a CC interval of about 6 to  7 mm as apposed to the 25 mm CC interval that she had preoperatively.  Satisfied with the CC ligament reconstruction and repair, we then directed  our attention to the acromion and found that the gap between the distal  clavicle and the acromion was about a half inch which was appropriate for a  chronic AC separation reconstructed.  We then repaired the longitudinal  split in the deltoid fascia with 2-0 Vicryl suture after washing the wound   out with normal saline solution and reinforced it over the clavicle that the  fascia easily went over.  The subcutaneous tissue was then further closed  with 2-0 Vicryl suture and the skin with 4-0 Monocryl suture.  A dressing of  Xeroform, 4 x 4 dressing sponges and HypaFix tape applied.  Patient was  placed in a postoperative sling, awakened and taken to the recovery room  without difficulty.                                               Feliberto Gottron. Turner Daniels, M.D.    Ovid Curd  D:  10/26/2003  T:  10/27/2003  Job:  [id number]

## 2011-05-01 ENCOUNTER — Encounter (HOSPITAL_COMMUNITY): Payer: BC Managed Care – PPO | Admitting: Psychology

## 2011-05-01 ENCOUNTER — Telehealth: Payer: Self-pay | Admitting: Family Medicine

## 2011-05-01 ENCOUNTER — Other Ambulatory Visit: Payer: Self-pay | Admitting: Family Medicine

## 2011-05-01 MED ORDER — METRONIDAZOLE 0.75 % VA GEL: 1.0000 | Freq: Two times a day (BID) | VAGINAL | Status: AC

## 2011-05-01 NOTE — Telephone Encounter (Signed)
Advised pt to stop taking medication and to take a Benadryl to see if it helps the rash.  Dr. Earlene Plater had left for the day so Dr. Jennette Kettle called in Flagyl vaginal cream.  Called patient and left her a message with this update.

## 2011-05-01 NOTE — Telephone Encounter (Signed)
Returned call.  No answer.  Left VMM to call us back before 5 pm.

## 2011-05-01 NOTE — Telephone Encounter (Signed)
Pt took her med flagyl last night at midnight, woke up about 1:30 am with severe stomach pain, started vomitting about 15 mins after that, after vomitting a few times she was able to go back to sleep, woke up today about 8 am, felt a little weak & had a small rash on her neck going up to her face, not itchy, breathing ok. Pt is afraid to take this med, would like home advice.

## 2011-05-01 NOTE — Progress Notes (Signed)
Oral flagyl made her nauseated, small rash on back of neck Will try vaginal form

## 2011-05-11 ENCOUNTER — Encounter (HOSPITAL_COMMUNITY): Payer: BC Managed Care – PPO | Admitting: Psychology

## 2011-05-25 ENCOUNTER — Encounter (HOSPITAL_COMMUNITY): Payer: BC Managed Care – PPO | Admitting: Psychology

## 2011-08-16 ENCOUNTER — Inpatient Hospital Stay (HOSPITAL_COMMUNITY)
Admission: EM | Admit: 2011-08-16 | Discharge: 2011-08-18 | DRG: 449 | Disposition: A | Discharge status: Home / Self Care | Payer: BC Managed Care – PPO | Attending: Internal Medicine | Admitting: Internal Medicine

## 2011-08-16 DIAGNOSIS — T620X1A Toxic effect of ingested mushrooms, accidental (unintentional), initial encounter: Principal | ICD-10-CM | POA: Diagnosis present

## 2011-08-16 DIAGNOSIS — F319 Bipolar disorder, unspecified: Secondary | ICD-10-CM | POA: Diagnosis present

## 2011-08-16 DIAGNOSIS — F1999 Other psychoactive substance use, unspecified with unspecified psychoactive substance-induced disorder: Secondary | ICD-10-CM | POA: Diagnosis present

## 2011-08-16 DIAGNOSIS — E876 Hypokalemia: Secondary | ICD-10-CM | POA: Diagnosis present

## 2011-08-16 DIAGNOSIS — T65891A Toxic effect of other specified substances, accidental (unintentional), initial encounter: Secondary | ICD-10-CM | POA: Diagnosis present

## 2011-08-16 DIAGNOSIS — I10 Essential (primary) hypertension: Secondary | ICD-10-CM | POA: Diagnosis present

## 2011-08-16 DIAGNOSIS — F1910 Other psychoactive substance abuse, uncomplicated: Secondary | ICD-10-CM | POA: Diagnosis present

## 2011-08-16 DIAGNOSIS — F909 Attention-deficit hyperactivity disorder, unspecified type: Secondary | ICD-10-CM | POA: Diagnosis present

## 2011-08-17 ENCOUNTER — Emergency Department (HOSPITAL_COMMUNITY): Payer: BC Managed Care – PPO

## 2011-08-17 ENCOUNTER — Encounter (HOSPITAL_COMMUNITY): Payer: Self-pay

## 2011-08-17 DIAGNOSIS — F29 Unspecified psychosis not due to a substance or known physiological condition: Secondary | ICD-10-CM

## 2011-08-17 LAB — CBC
HCT: 38.9 % (ref 36.0–46.0)
MCH: 29.4 pg (ref 26.0–34.0)
MCHC: 35.2 g/dL (ref 30.0–36.0)
MCV: 83.5 fL (ref 78.0–100.0)
Platelets: 306 10*3/uL (ref 150–400)
RDW: 12.8 % (ref 11.5–15.5)

## 2011-08-17 LAB — POCT I-STAT, CHEM 8
Calcium, Ion: 1.15 mmol/L (ref 1.12–1.32)
Chloride: 108 mEq/L (ref 96–112)
Creatinine, Ser: 1 mg/dL (ref 0.50–1.10)
Glucose, Bld: 122 mg/dL — ABNORMAL HIGH (ref 70–99)
Potassium: 3.7 mEq/L (ref 3.5–5.1)

## 2011-08-17 LAB — DIFFERENTIAL
Eosinophils Absolute: 0 10*3/uL (ref 0.0–0.7)
Eosinophils Relative: 0 % (ref 0–5)
Lymphocytes Relative: 19 % (ref 12–46)
Lymphs Abs: 1.6 10*3/uL (ref 0.7–4.0)
Monocytes Absolute: 0.8 10*3/uL (ref 0.1–1.0)

## 2011-08-17 LAB — COMPREHENSIVE METABOLIC PANEL
AST: 42 U/L — ABNORMAL HIGH (ref 0–37)
BUN: 13 mg/dL (ref 6–23)
CO2: 24 mEq/L (ref 19–32)
Chloride: 103 mEq/L (ref 96–112)
Creatinine, Ser: 0.91 mg/dL (ref 0.50–1.10)
GFR calc Af Amer: >60 mL/min (ref >60)
GFR calc non Af Amer: >60 mL/min (ref >60)
Glucose, Bld: 123 mg/dL — ABNORMAL HIGH (ref 70–99)
Total Bilirubin: 0.2 mg/dL — ABNORMAL LOW (ref 0.3–1.2)

## 2011-08-17 LAB — URINALYSIS, ROUTINE W REFLEX MICROSCOPIC
Nitrite: NEGATIVE
Protein, ur: 30 mg/dL — AB
Urobilinogen, UA: 1 mg/dL (ref 0.0–1.0)

## 2011-08-17 LAB — RAPID URINE DRUG SCREEN, HOSP PERFORMED
Barbiturates: NOT DETECTED
Benzodiazepines: NOT DETECTED
Cocaine: NOT DETECTED
Opiates: NOT DETECTED
Tetrahydrocannabinol: NOT DETECTED

## 2011-08-17 LAB — CK: Total CK: 304 U/L — ABNORMAL HIGH (ref 7–177)

## 2011-08-17 LAB — PREGNANCY, URINE: Preg Test, Ur: NEGATIVE

## 2011-08-17 LAB — URINE MICROSCOPIC-ADD ON

## 2011-08-18 LAB — COMPREHENSIVE METABOLIC PANEL
ALT: 31 U/L (ref 0–35)
Alkaline Phosphatase: 47 U/L (ref 39–117)
BUN: 11 mg/dL (ref 6–23)
CO2: 24 mEq/L (ref 19–32)
GFR calc Af Amer: >60 mL/min (ref >60)
GFR calc non Af Amer: >60 mL/min (ref >60)
Glucose, Bld: 89 mg/dL (ref 70–99)
Potassium: 3.3 mEq/L — ABNORMAL LOW (ref 3.5–5.1)
Sodium: 140 mEq/L (ref 135–145)
Total Bilirubin: 0.2 mg/dL — ABNORMAL LOW (ref 0.3–1.2)

## 2011-08-18 LAB — URINE DRUGS OF ABUSE SCREEN W ALC, ROUTINE (REF LAB)
Amphetamine Screen, Ur: NEGATIVE
Barbiturate Quant, Ur: NEGATIVE
Cocaine Metabolites: NEGATIVE
Creatinine,U: 243 mg/dL
Marijuana Metabolite: NEGATIVE
Methadone: POSITIVE — AB
Propoxyphene: NEGATIVE

## 2011-08-18 LAB — CBC
HCT: 35.1 % — ABNORMAL LOW (ref 36.0–46.0)
Hemoglobin: 12.1 g/dL (ref 12.0–15.0)
MCHC: 34.5 g/dL (ref 30.0–36.0)
RBC: 4.14 MIL/uL (ref 3.87–5.11)

## 2011-08-18 LAB — URINE CULTURE

## 2011-08-18 LAB — CK: Total CK: 157 U/L (ref 7–177)

## 2011-08-18 NOTE — Discharge Summary (Signed)
[name], [name] NO.:  [id number]  MEDICAL RECORD NO.:  [id number]  LOCATION:  1605                         FACILITY:  [facility]  PHYSICIAN:  Andreas Blower, MD       DATE OF BIRTH:  01/28/1981  DATE OF ADMISSION:  08/16/2011 DATE OF DISCHARGE:  08/18/2011                              DISCHARGE SUMMARY   PRIMARY CARE PHYSICIAN:  Helane Rima, MD with [facility]  CONSULTATIONS:  Psychiatry; Eulogio Ditch, MD  DISCHARGE DIAGNOSES: 1. 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 2. History of attention deficit hyperactivity disorder. 3. History of bipolar disorder. 4. History of depression. 5. Polysubstance abuse. 6. Questionable history of hypertension. 7. Hypokalemia.  DISCHARGE MEDICATIONS:  Methadone 100 mg p.o. daily, given at the [facility].  BRIEF ADMITTING HISTORY AND PHYSICAL:  [name] is a 30 year old Caucasian female with history of ADHD, bipolar disorder, and depression; who presented on August 17, 2011, with acute psychosis, confusion, and agitation on August 17, 2011.  RADIOLOGY/IMAGING: 1. The patient had a portable chest x-ray which showed mild vascular     congestion.  Lungs remain clear. 2. The patient had a head CT without contrast which showed     unremarkable noncontrast CT of the head.  LABORATORY DATA:  CBC shows white count of 9.6, hemoglobin 12.1, hematocrit 35.1, platelet count 264.  Electrolytes normal with a BUN of 11, creatinine 0.50, potassium is 3.3.  Liver function tests normal, except albumin is 3.2.  CK is 157.  Serum drug screen positive for methadone.  Urine drug screen was negative.  UA negative for nitrites and leukocytes.  HOSPITAL COURSE BY PROBLEM: 1. Acute psychosis, etiology unclear, maybe due to magic mushrooms     that the patient ingested.  The patient was clearly agitated in the     ER, received 10 mg of Haldol and 4 mg of Ativan.  The patient  was     sedated and after medications wore off, the patient reported that     she had stopped taking her psychiatric medications.  Dr. Rogers Blocker     from Psychiatry evaluated the patient and cleared her from a     psychiatric standpoint to be discharged.  [name]     was also contacted and they had recommended just conservative     management.  The patient was instructed to follow up with Dr. Evelene Croon,     her psychiatrist, on August 24, 2011, at 12:15 p.m. where she     has an appointment.  However, the patient indicated that she will     go to Dr. Carie Caddy office today to get her medications. 2. ADHD.  Continue medications as per her psychiatrist. 3. Bipolar disorder and depression.  Continue management as per her     psychiatrist as outpatient. 4. Polysubstance abuse.  The patient was given a dose of methadone on     August 17, 2011, and August 18, 2011.  The patient will     continue to get treatment at methadone clinic daily for her     medications. 5. Hypokalemia, resolved with replacement.  Time spent on discharge talking  to the patient and coordinating care was 25 minutes.   Andreas Blower, MD   SR/MEDQ  D:  08/18/2011  T:  08/18/2011  Job:  [id number]  Electronically Signed by Wardell Heath Seini Lannom  on 08/18/2011 08:37:47 PM

## 2011-08-21 LAB — METHADONE, CONFIRMATION: Methadone GC/MS Conf: 6369 NG/ML — ABNORMAL HIGH
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
## 2011-10-22 ENCOUNTER — Ambulatory Visit: Payer: BC Managed Care – PPO | Admitting: Family Medicine

## 2011-10-28 ENCOUNTER — Inpatient Hospital Stay: Payer: Self-pay | Admitting: Internal Medicine

## 2012-01-10 ENCOUNTER — Emergency Department: Payer: Self-pay | Admitting: Emergency Medicine

## 2012-01-11 LAB — CBC
HCT: 40.2 % (ref 35.0–47.0)
HGB: 13.7 g/dL (ref 12.0–16.0)
MCHC: 34.1 g/dL (ref 32.0–36.0)
MCV: 88 fL (ref 80–100)
Platelet: 303 10*3/uL (ref 150–440)
RBC: 4.56 10*6/uL (ref 3.80–5.20)
RDW: 13.8 % (ref 11.5–14.5)

## 2012-01-11 LAB — COMPREHENSIVE METABOLIC PANEL
Alkaline Phosphatase: 64 U/L (ref 50–136)
Anion Gap: 12 (ref 7–16)
BUN: 15 mg/dL (ref 7–18)
Bilirubin,Total: 0.2 mg/dL (ref 0.2–1.0)
Chloride: 105 mmol/L (ref 98–107)
Co2: 27 mmol/L (ref 21–32)
Creatinine: 0.69 mg/dL (ref 0.60–1.30)
EGFR (African American): >60
EGFR (Non-African Amer.): >60
Osmolality: 289 (ref 275–301)
Potassium: 3.7 mmol/L (ref 3.5–5.1)
Sodium: 144 mmol/L (ref 136–145)
Total Protein: 7.3 g/dL (ref 6.4–8.2)

## 2012-01-11 LAB — ETHANOL
Ethanol %: <0.003 % (ref 0.000–0.080)
Ethanol: <3 mg/dL

## 2012-01-11 LAB — DRUG SCREEN, URINE
Barbiturates, Ur Screen: POSITIVE (ref ?–200)
Cannabinoid 50 Ng, Ur ~~LOC~~: NEGATIVE (ref ?–50)
Cocaine Metabolite,Ur ~~LOC~~: NEGATIVE (ref ?–300)
MDMA (Ecstasy)Ur Screen: NEGATIVE (ref ?–500)
Methadone, Ur Screen: NEGATIVE (ref ?–300)
Opiate, Ur Screen: POSITIVE (ref ?–300)
Phencyclidine (PCP) Ur S: NEGATIVE (ref ?–25)

## 2012-08-23 ENCOUNTER — Emergency Department (HOSPITAL_COMMUNITY): Payer: BC Managed Care – PPO

## 2012-08-23 ENCOUNTER — Encounter (HOSPITAL_COMMUNITY): Payer: Self-pay | Admitting: *Deleted

## 2012-08-23 ENCOUNTER — Emergency Department (HOSPITAL_COMMUNITY)
Admission: EM | Admit: 2012-08-23 | Discharge: 2012-08-23 | Disposition: A | Discharge status: Home / Self Care | Payer: BC Managed Care – PPO | Attending: Emergency Medicine | Admitting: Emergency Medicine

## 2012-08-23 DIAGNOSIS — I10 Essential (primary) hypertension: Secondary | ICD-10-CM | POA: Insufficient documentation

## 2012-08-23 DIAGNOSIS — R079 Chest pain, unspecified: Secondary | ICD-10-CM

## 2012-08-23 DIAGNOSIS — R0789 Other chest pain: Secondary | ICD-10-CM | POA: Insufficient documentation

## 2012-08-23 LAB — CBC WITH DIFFERENTIAL/PLATELET
Basophils Absolute: 0.1 10*3/uL (ref 0.0–0.1)
Basophils Relative: 1 % (ref 0–1)
Eosinophils Absolute: 0.4 10*3/uL (ref 0.0–0.7)
Eosinophils Relative: 4 % (ref 0–5)
HCT: 40.6 % (ref 36.0–46.0)
Hemoglobin: 14.1 g/dL (ref 12.0–15.0)
Lymphocytes Relative: 27 % (ref 12–46)
Lymphs Abs: 2.9 10*3/uL (ref 0.7–4.0)
MCH: 29 pg (ref 26.0–34.0)
MCHC: 34.7 g/dL (ref 30.0–36.0)
MCV: 83.4 fL (ref 78.0–100.0)
Monocytes Absolute: 0.6 10*3/uL (ref 0.1–1.0)
Monocytes Relative: 5 % (ref 3–12)
Neutro Abs: 6.8 10*3/uL (ref 1.7–7.7)
Neutrophils Relative %: 64 % (ref 43–77)
Platelets: 337 10*3/uL (ref 150–400)
RBC: 4.87 MIL/uL (ref 3.87–5.11)
RDW: 12.1 % (ref 11.5–15.5)
WBC: 10.7 10*3/uL — ABNORMAL HIGH (ref 4.0–10.5)

## 2012-08-23 LAB — TROPONIN I: Troponin I: <0.3 ng/mL (ref <0.30)

## 2012-08-23 LAB — COMPREHENSIVE METABOLIC PANEL
ALT: 53 U/L — ABNORMAL HIGH (ref 0–35)
AST: 38 U/L — ABNORMAL HIGH (ref 0–37)
Albumin: 3.6 g/dL (ref 3.5–5.2)
Alkaline Phosphatase: 69 U/L (ref 39–117)
BUN: 9 mg/dL (ref 6–23)
CO2: 22 mEq/L (ref 19–32)
Calcium: 8.9 mg/dL (ref 8.4–10.5)
Chloride: 99 mEq/L (ref 96–112)
Creatinine, Ser: 0.7 mg/dL (ref 0.50–1.10)
GFR calc Af Amer: >90 mL/min (ref >90)
GFR calc non Af Amer: >90 mL/min (ref >90)
Glucose, Bld: 115 mg/dL — ABNORMAL HIGH (ref 70–99)
Potassium: 3.2 mEq/L — ABNORMAL LOW (ref 3.5–5.1)
Sodium: 134 mEq/L — ABNORMAL LOW (ref 135–145)
Total Bilirubin: 0.1 mg/dL — ABNORMAL LOW (ref 0.3–1.2)
Total Protein: 7.3 g/dL (ref 6.0–8.3)

## 2012-08-23 LAB — D-DIMER, QUANTITATIVE (NOT AT ARMC): D-Dimer, Quant: 0.44 ug/mL-FEU (ref 0.00–0.48)

## 2012-08-23 MED ORDER — ASPIRIN 81 MG PO CHEW
324.0000 mg | CHEWABLE_TABLET | Freq: Once | ORAL | Status: AC
  Administered 2012-08-23: 324 mg via ORAL
  Filled 2012-08-23: qty 4

## 2012-08-23 MED ORDER — CLONIDINE HCL 0.2 MG PO TABS: 0.1000 mg | ORAL_TABLET | Freq: Three times a day (TID) | ORAL | Status: DC

## 2012-08-23 NOTE — ED Provider Notes (Signed)
History    31 year old female with chest pain. She describes this is a sensation of tightness in soreness. Noticed that she got up this morning. Worse with certain movements particularly twisting her trunk and deep inspiration. No shortness of breath. No unusual leg pain or swelling. Denies history of blood clot. Per review of records, she does have a history of prior cocaine abuse. She denies this recently. No fevers or chills. No cough   CSN: [id number]  Arrival date & time 08/23/12  0600   First MD Initiated Contact with Patient 08/23/12 0757      Chief Complaint  Patient presents with  . Chest Pain    (Consider location/radiation/quality/duration/timing/severity/associated sxs/prior treatment) HPI  Past Medical History  Diagnosis Date  . Anxiety   . Depression   . ADHD (attention deficit hyperactivity disorder)   . Polysubstance abuse     - Hx ETOH, cocaine, THC, IV heroin, narcotics  . Knee pain, bilateral     - patellofemoral syndrome, followed by Dr. [name] at [facility]  . Migraines   . Borderline systolic HTN   . Psychiatric care     Previous [facility] H&P notes PMHx Schizophrenia, though patient denies this.    No past surgical history on file.  No family history on file.  History  Substance Use Topics  . Smoking status: Current Every Day Smoker -- 0.3 packs/day    Types: Cigarettes  . Smokeless tobacco: Never Used  . Alcohol Use: No    OB History    Grav Para Term Preterm Abortions TAB SAB Ect Mult Living                  Review of Systems   Review of symptoms negative unless otherwise noted in HPI.   Allergies  Review of patient's allergies indicates no known allergies.  Home Medications   Current Outpatient Rx  Name Route Sig Dispense Refill  . HYDROCODONE-ACETAMINOPHEN 10-325 MG PO TABS Oral Take 1 tablet by mouth every 6 (six) hours as needed. For pain    . LISDEXAMFETAMINE DIMESYLATE 70 MG PO CAPS Oral Take 70 mg by mouth every morning.      BP  153/97  Pulse 95  Temp 97.7 F (36.5 C) (Oral)  Resp 24  SpO2 100%  LMP 08/22/2012  Physical Exam  Nursing note and vitals reviewed. Constitutional: She appears well-developed and well-nourished. No distress.       Laying in bed. NAD. Obese.  HENT:  Head: Normocephalic and atraumatic.  Eyes: Conjunctivae normal are normal. Right eye exhibits no discharge. Left eye exhibits no discharge.  Neck: Neck supple.  Cardiovascular: Normal rate, regular rhythm and normal heart sounds.  Exam reveals no gallop and no friction rub.   No murmur heard. Pulmonary/Chest: Effort normal and breath sounds normal. No respiratory distress. She exhibits tenderness.       Tenderness to palpation along the left sternal border. Overlying skin is grossly normal in appearance. No crepitus. Lungs are clear bilaterally. Air movement is symmetric.  Abdominal: Soft. She exhibits no distension. There is no tenderness.  Musculoskeletal: She exhibits no edema and no tenderness.       Lower extremities symmetric as compared to each other. No calf tenderness. Negative Homan's. No palpable cords.   Neurological: She is alert.  Skin: Skin is warm and dry.  Psychiatric: She has a normal mood and affect. Her behavior is normal. Thought content normal.    ED Course  Procedures (including critical care time)  Labs Reviewed  CBC WITH DIFFERENTIAL - Abnormal; Notable for the following:    WBC 10.7 (*)     All other components within normal limits  COMPREHENSIVE METABOLIC PANEL - Abnormal; Notable for the following:    Sodium 134 (*)     Potassium 3.2 (*)     Glucose, Bld 115 (*)     AST 38 (*)     ALT 53 (*)     Total Bilirubin 0.1 (*)     All other components within normal limits  TROPONIN I  D-DIMER, QUANTITATIVE   Dg Chest 2 View  08/23/2012  *RADIOLOGY REPORT*  Clinical Data: Chest pain.  CHEST - 2 VIEW  Comparison: 08/17/2011.  Findings: The cardiac silhouette, mediastinal and hilar contours are normal.  The  lungs are clear.  No pleural effusion.  The bony thorax is intact.  IMPRESSION: Normal chest x-ray.   Original Report Authenticated By: P. Loralie Champagne, M.D.    EKG:  Rhythm: sinus tach Vent. rate 103 BPM PR interval 152 ms QRS duration 92 ms QT/QTc 356/466 ms Axis: normal Conduction: LAE ST segments: Mild ST depression inferiorly and NS changes across precordium. EKG is stable from prior EKG just 5 days ago.   1. Chest pain   2. Hypertension       MDM  31 year old female with chest pain. Atypical for ACS given preproducibility. EKG is not normal but is stable from priors. Low suspicion for PE and normal d-dimer makes highly unlikely. Afebrile in no distress on exam. Chest x-ray is clear. Patient does have a history of polysubstance abuse including cocaine, but she adamantly denies this currently. Feel she is safe for DC at this time. Return precautions discussed.         Slaby Razor, MD 09/02/12 1126

## 2012-08-23 NOTE — ED Notes (Signed)
Pt c/o left chest tightness since 5 am. Pt denies shortness of breath. Pt reports increased tightness with ambulation. Pt reports she smoked a cigarette just prior to arriving to ED. Pt denies n/v. Skin is warm and dry, pt is A&Ox4.

## 2012-09-07 ENCOUNTER — Ambulatory Visit: Payer: BC Managed Care – PPO | Admitting: Emergency Medicine

## 2012-09-13 IMAGING — CR DG KNEE 1-2V*R*
2 series · 2 of 2 positions shown · non-contrast
Comparison: None.

CLINICAL DATA: Pain.

RIGHT KNEE - 1-2 VIEW

[view not recorded (1 of 2)]
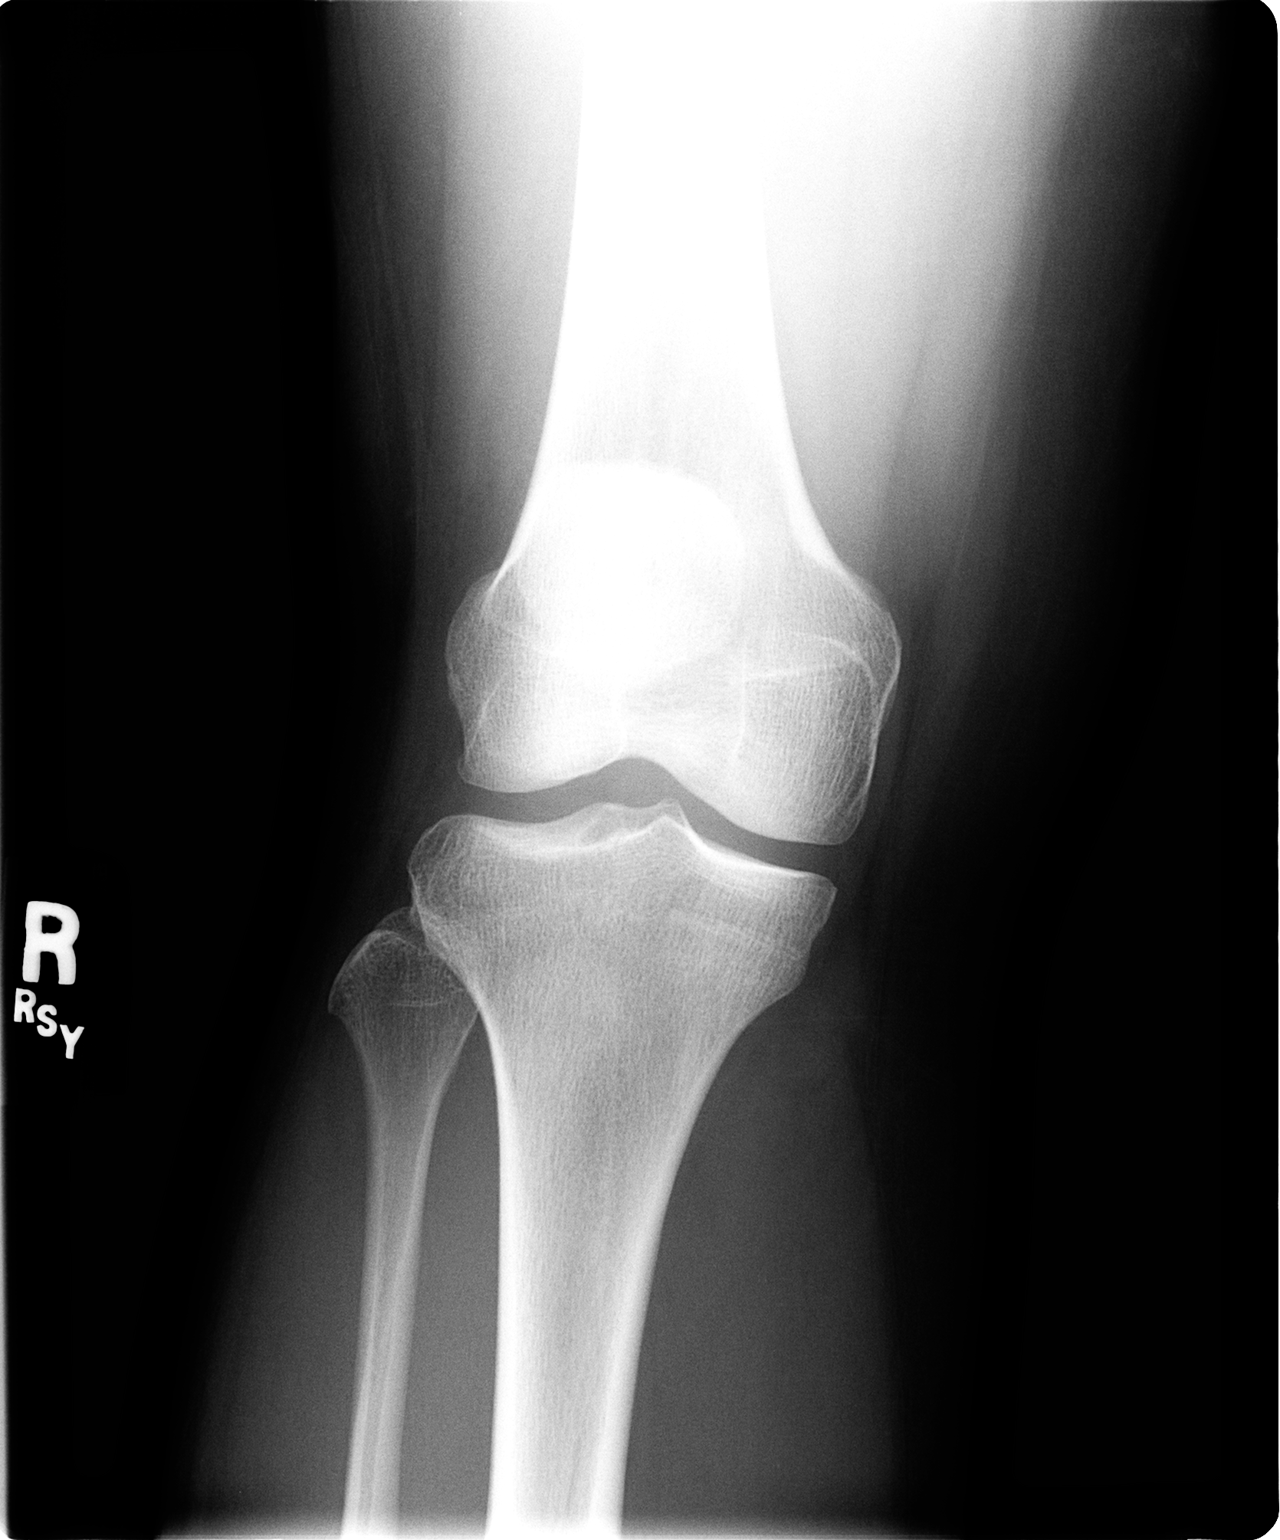

[view not recorded (2 of 2)]
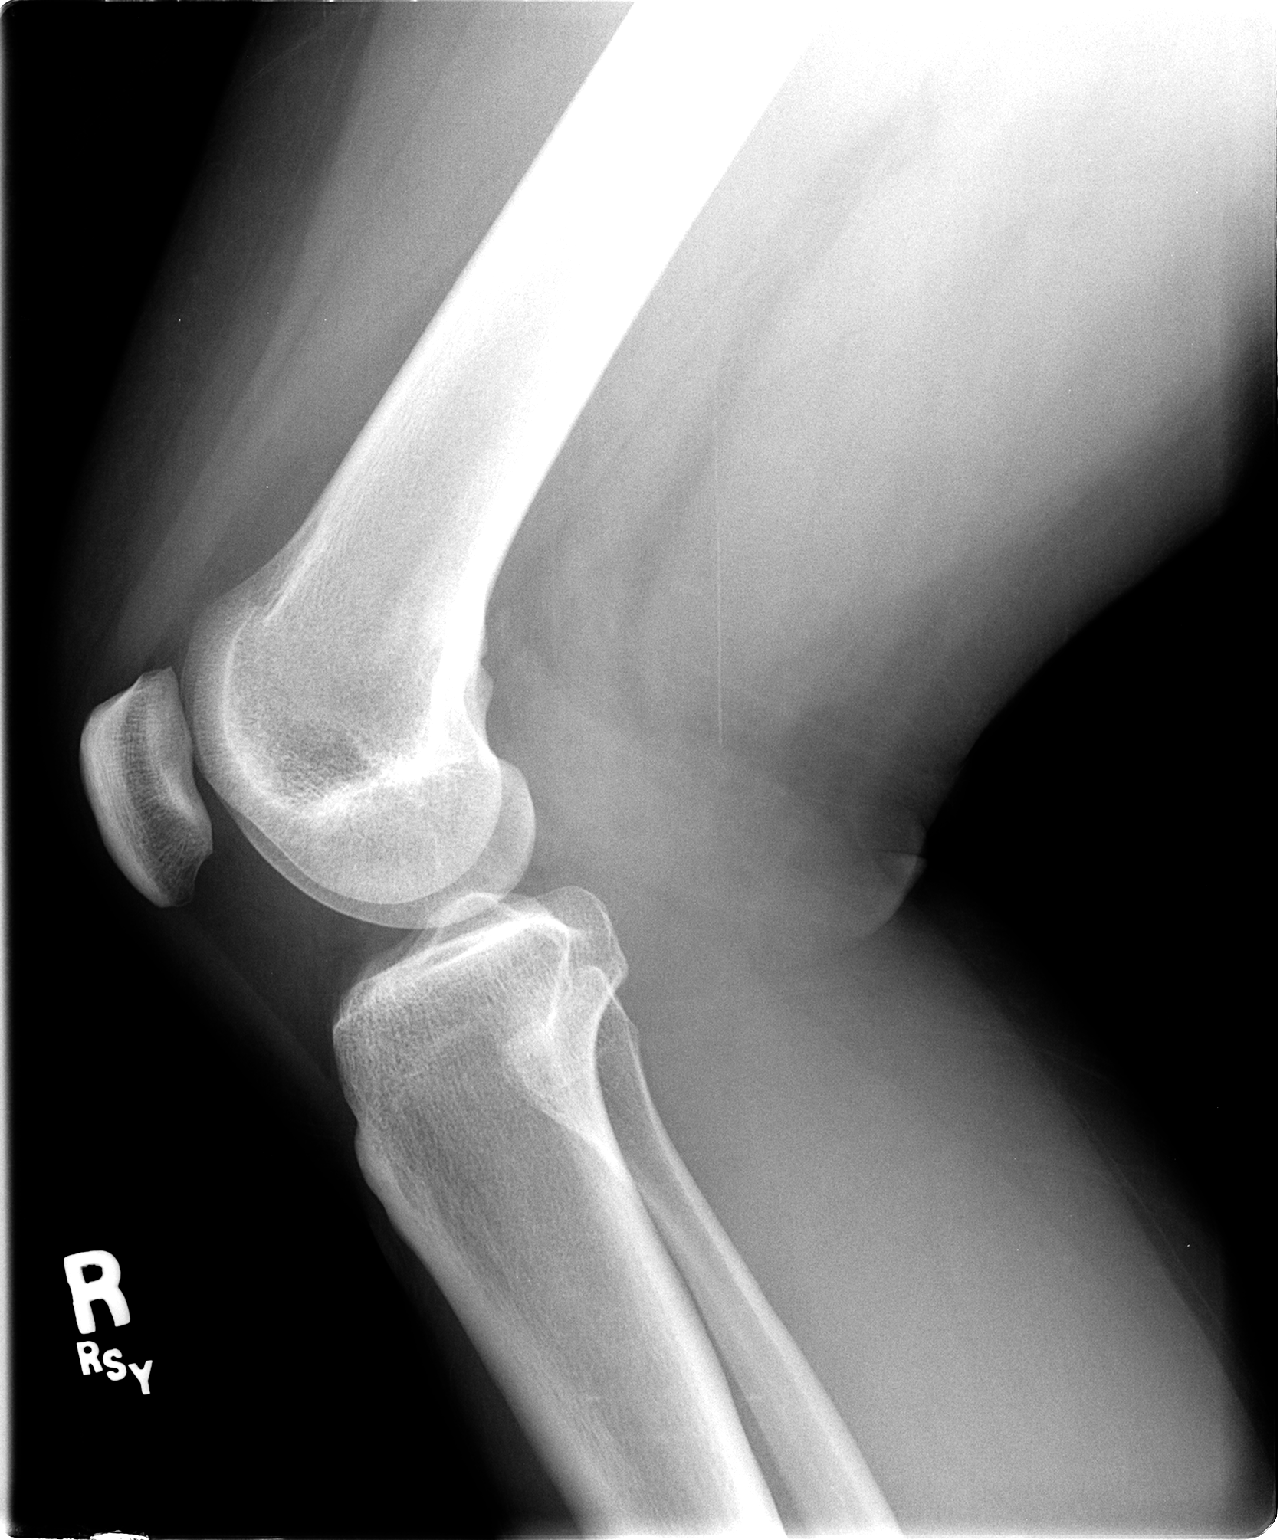

[2 of 2 positions shown; findings below may reference images not displayed]

FINDINGS: Imaged bones, joints and soft tissues appear normal.
IMPRESSION: Negative exam.

## 2012-10-13 ENCOUNTER — Ambulatory Visit: Payer: BC Managed Care – PPO

## 2012-12-02 ENCOUNTER — Encounter: Payer: Self-pay | Admitting: Family Medicine

## 2012-12-02 ENCOUNTER — Ambulatory Visit: Payer: BC Managed Care – PPO

## 2012-12-02 ENCOUNTER — Ambulatory Visit (INDEPENDENT_AMBULATORY_CARE_PROVIDER_SITE_OTHER): Payer: BC Managed Care – PPO | Admitting: Family Medicine

## 2012-12-02 ENCOUNTER — Telehealth: Payer: Self-pay | Admitting: Family Medicine

## 2012-12-02 VITALS — BP 143/91 | HR 89 | Temp 99.4°F | Ht 67.0 in | Wt 217.0 lb

## 2012-12-02 DIAGNOSIS — R300 Dysuria: Secondary | ICD-10-CM

## 2012-12-02 DIAGNOSIS — N390 Urinary tract infection, site not specified: Secondary | ICD-10-CM

## 2012-12-02 DIAGNOSIS — F1910 Other psychoactive substance abuse, uncomplicated: Secondary | ICD-10-CM

## 2012-12-02 DIAGNOSIS — J069 Acute upper respiratory infection, unspecified: Secondary | ICD-10-CM | POA: Insufficient documentation

## 2012-12-02 LAB — POCT URINALYSIS DIPSTICK
Bilirubin, UA: NEGATIVE
Glucose, UA: NEGATIVE
Spec Grav, UA: 1.02

## 2012-12-02 LAB — POCT UA - MICROSCOPIC ONLY: Epithelial cells, urine per micros: >20

## 2012-12-02 MED ORDER — IBUPROFEN 600 MG PO TABS: 600.0000 mg | ORAL_TABLET | Freq: Three times a day (TID) | ORAL | Status: DC | PRN

## 2012-12-02 MED ORDER — BENZONATATE 100 MG PO CAPS: 100.0000 mg | ORAL_CAPSULE | Freq: Two times a day (BID) | ORAL | Status: DC | PRN

## 2012-12-02 MED ORDER — FLUTICASONE PROPIONATE 50 MCG/ACT NA SUSP: 2.0000 | Freq: Every day | NASAL | Status: DC

## 2012-12-02 NOTE — Telephone Encounter (Signed)
UA negative for infection, however did have moderate blood.  She will need repeat UA in about 1-2 months to make sure this clears up.  Please call patient and schedule follow up with Dr. Elwyn Reach in 1-2 months.  Thanks.

## 2012-12-02 NOTE — Assessment & Plan Note (Signed)
UA negative for LE or nitrites.  Moderate blood, 1-5 RBC. - Patient will need follow up UA to recheck hematuria- - No sign of UTI at this time

## 2012-12-02 NOTE — Assessment & Plan Note (Signed)
Symptoms likely secondary to URI.  Afebrile in office today.   - Will treat conservatively with Tessalon perles, Flonase, and Motrin PRN - Red flags reviewed per AVS

## 2012-12-02 NOTE — Assessment & Plan Note (Signed)
Discussed our protocol on controlled substance - since patient has not been seen here for chronic pain in awhile, advised patient to schedule appointment with PCP or call ortho for refill since they were the last to fill Norco.  Patient says she cannot schedule an appointment here due to insurance terminating on 12/31, so she will call ortho for a refill.

## 2012-12-02 NOTE — Progress Notes (Signed)
  Subjective:    Patient ID: [name], female    DOB: 1981-11-01, 31 y.o.   MRN: [id number]  HPI  Patient presents to same day appointment for flu-like symptoms.  Symptoms include chest congestion, nasal congestion, cough, and subjective fevers at home.  She did not take temperature a thome.  Did not receive flu shot this year.  Dry cough.  + nasal disharge is clear.  Decreased appetite, but drinking plenty of fluids.  No nausea or vomiting or abdominal pain.  Does complain of reddish colored stools, not watery - started yesterday.  Has taken OTC cold and flu which helped a little.  Patient sounds very congested throughout encounter.  Afebrile in clinic.  Also complains of burning with urination and urgency.  Has had UTI in the past and she says it feels similar to that.  No associated pelvic or back pain.  She is asking for refill on Norco for chronic knee pain that was originally prescribed by her orthopedic surgeon in 2012.  Discussed our protocol on controlled substances and that she will need to contact her orthopedic doctor or establish care with new PCP to discuss chronic pain management.  Patient was frustrated because her insurance is about to end on 12/31 and will not be able to schedule appointment with PCP.  She understands our protocol.   Review of Systems  Per HPI    Objective:   Physical Exam  Constitutional: No distress.  HENT:  Right Ear: External ear normal.  Left Ear: External ear normal.  Nose: Rhinorrhea present.  Mouth/Throat: Oropharynx is clear and moist.  Neck: Normal range of motion. Neck supple.  Cardiovascular: Normal rate and regular rhythm.   Pulmonary/Chest: Effort normal and breath sounds normal. She has no wheezes. She has no rales.  Abdominal: Soft. She exhibits no distension. There is no tenderness. There is no rebound and no guarding.  Skin: No rash noted.       Assessment & Plan:

## 2012-12-02 NOTE — Patient Instructions (Signed)
It was good to see you today, [name].  I am sorry you are not feeling well. Please pick up prescriptions at your pharmacy. For cough and sore throat, please purchase CEPACOL cough drops. Drink plenty of fluids and get plenty of rest. Please refer to instructions below regarding the common cold. If you develop worsening symptoms, fever (temp > 101 degrees), persistent cough, or nausea/vomiting, please return to clinic. Schedule appointment with your PCP at your earliest convenience to discuss chronic issues. Hope you feel better soon.  Upper Respiratory Infection, Adult  An upper respiratory infection (URI) is also known as the common cold. It is often caused by a type of germ (virus). Colds are easily spread (contagious). You can pass it to others by kissing, coughing, sneezing, or drinking out of the same glass. Usually, you get better in 1 or 2 weeks.  HOME CARE  Only take medicine as told by your doctor.  Use a warm mist humidifier or breathe in steam from a hot shower.  Drink enough water and fluids to keep your pee (urine) clear or pale yellow.  Get plenty of rest.  Return to work when your temperature is back to normal or as told by your doctor. You may use a face mask and wash your hands to stop your cold from spreading. GET HELP RIGHT AWAY IF:  After the first few days, you feel you are getting worse.  You have questions about your medicine.  You have chills, shortness of breath, or brown or red spit (mucus).  You have yellow or brown snot (nasal discharge) or pain in the face, especially when you bend forward.  You have a fever, puffy (swollen) neck, pain when you swallow, or white spots in the back of your throat.  You have a bad headache, ear pain, sinus pain, or chest pain.  You have a high-pitched whistling sound when you breathe in and out (wheezing).  You have a lasting cough or cough up blood.  You have sore muscles or a stiff neck. MAKE SURE YOU:  Understand these  instructions.  Will watch your condition.  Will get help right away if you are not doing well or get worse. Document Released: 05/11/2008 Document Revised: 02/15/2012 Document Reviewed: 03/30/2011  [facility] Patient Information 2013 [place], [place].

## 2012-12-05 NOTE — Telephone Encounter (Signed)
Called pt. Left message with 'female' to call us. Please see message below. Lorenda Hatchet, Renato Battles

## 2013-02-14 ENCOUNTER — Emergency Department (HOSPITAL_COMMUNITY)
Admission: EM | Admit: 2013-02-14 | Discharge: 2013-02-14 | Disposition: A | Discharge status: Home / Self Care | Payer: BC Managed Care – PPO | Attending: Emergency Medicine | Admitting: Emergency Medicine

## 2013-02-14 ENCOUNTER — Encounter (HOSPITAL_COMMUNITY): Payer: Self-pay | Admitting: Emergency Medicine

## 2013-02-14 DIAGNOSIS — F909 Attention-deficit hyperactivity disorder, unspecified type: Secondary | ICD-10-CM | POA: Insufficient documentation

## 2013-02-14 DIAGNOSIS — F17200 Nicotine dependence, unspecified, uncomplicated: Secondary | ICD-10-CM | POA: Insufficient documentation

## 2013-02-14 DIAGNOSIS — M542 Cervicalgia: Secondary | ICD-10-CM | POA: Insufficient documentation

## 2013-02-14 DIAGNOSIS — R030 Elevated blood-pressure reading, without diagnosis of hypertension: Secondary | ICD-10-CM | POA: Insufficient documentation

## 2013-02-14 DIAGNOSIS — Y92009 Unspecified place in unspecified non-institutional (private) residence as the place of occurrence of the external cause: Secondary | ICD-10-CM | POA: Insufficient documentation

## 2013-02-14 DIAGNOSIS — Z8679 Personal history of other diseases of the circulatory system: Secondary | ICD-10-CM | POA: Insufficient documentation

## 2013-02-14 DIAGNOSIS — T403X4A Poisoning by methadone, undetermined, initial encounter: Secondary | ICD-10-CM | POA: Insufficient documentation

## 2013-02-14 DIAGNOSIS — Z8659 Personal history of other mental and behavioral disorders: Secondary | ICD-10-CM | POA: Insufficient documentation

## 2013-02-14 DIAGNOSIS — F411 Generalized anxiety disorder: Secondary | ICD-10-CM | POA: Insufficient documentation

## 2013-02-14 DIAGNOSIS — F1910 Other psychoactive substance abuse, uncomplicated: Secondary | ICD-10-CM | POA: Insufficient documentation

## 2013-02-14 DIAGNOSIS — T403X1A Poisoning by methadone, accidental (unintentional), initial encounter: Secondary | ICD-10-CM | POA: Insufficient documentation

## 2013-02-14 DIAGNOSIS — Z79899 Other long term (current) drug therapy: Secondary | ICD-10-CM | POA: Insufficient documentation

## 2013-02-14 DIAGNOSIS — Y939 Activity, unspecified: Secondary | ICD-10-CM | POA: Insufficient documentation

## 2013-02-14 NOTE — ED Notes (Signed)
States that she went to the Methadone clinic and was mistakenly given 100 mg PO instead of her normal 40 mg. States that she was told to come to the ER urgently due to the medication will peak in 4 hrs.

## 2013-02-14 NOTE — ED Provider Notes (Signed)
History     CSN: [id number]  Arrival date & time 02/14/13  1208   First MD Initiated Contact with Patient 02/14/13 1259      Chief Complaint  Patient presents with  . Drug Overdose    (Consider location/radiation/quality/duration/timing/severity/associated sxs/prior treatment) HPI Comments: Patient comes to the ER at the recommendation of the methadone clinic where she was seen daily. Patient reported this morning for her normal methadone dose which is 40mg  daily. Patient reports that she was contacted by the clinic at home after she had left and told that she was mistakenly given 100 mg. She was told to go directly to the emergency department because of possible overdose.  Patient reports no symptoms. She is not having any sleepiness, lethargy, difficulty breathing. She feels like her normal self.  Patient is complaining of a painful skin tag on the left side of her neck. She reports it has been present for a long time. Her significant other tiny string around it to try to fall off, but it would not fall off. He also cannot get string off.  Patient is a 32 y.o. female presenting with Overdose.  Drug Overdose    Past Medical History  Diagnosis Date  . Anxiety   . Depression   . ADHD (attention deficit hyperactivity disorder)   . Polysubstance abuse     - Hx ETOH, cocaine, THC, IV heroin, narcotics  . 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 Migraines   . Borderline systolic HTN   . Psychiatric care     Previous [facility] H&P notes PMHx Schizophrenia, though patient denies this.    History reviewed. No pertinent past surgical history.  No family history on file.  History  Substance Use Topics  . Smoking status: Current Every Day Smoker -- 0.30 packs/day    Types: Cigarettes  . Smokeless tobacco: Never Used  . Alcohol Use: No    OB History   Grav Para Term Preterm Abortions TAB SAB Ect Mult Living                  Review  of Systems  Constitutional: Negative for fever.  Respiratory: Negative.   Cardiovascular: Negative.   Skin:       Painful skin tag  All other systems reviewed and are negative.    Allergies  Review of patient's allergies indicates no known allergies.  Home Medications   Current Outpatient Rx  Name  Route  Sig  Dispense  Refill  . cloNIDine (CATAPRES) 0.2 MG tablet   Oral   Take 0.5 tablets (0.1 mg total) by mouth 3 (three) times daily.   90 tablet   2   . methadone (DOLOPHINE) 10 MG/ML solution   Oral   Take 40 mg by mouth daily.           BP 150/84  Pulse 74  Temp(Src) 97.8 F (36.6 C) (Oral)  Resp 20  SpO2 98%  LMP 02/12/2013  Physical Exam  Constitutional: She is oriented to person, place, and time. She appears well-developed and well-nourished. No distress.  HENT:  Head: Normocephalic and atraumatic.  Right Ear: Hearing normal.  Nose: Nose normal.  Mouth/Throat: Oropharynx is clear and moist and mucous membranes are normal.  Eyes: Conjunctivae and EOM are normal. Pupils are equal, round, and reactive to light.  Neck: Normal range of motion. Neck supple.  Cardiovascular: Normal rate, regular rhythm, S1 normal and S2 normal.  Exam reveals no gallop and no friction rub.   No murmur heard. Pulmonary/Chest: Effort normal and breath sounds normal. No respiratory distress. She exhibits no tenderness.  Abdominal: Soft. Normal appearance and bowel sounds are normal. There is no hepatosplenomegaly. There is no tenderness. There is no rebound, no guarding, no tenderness at McBurney's point and negative Murphy's sign. No hernia.  Musculoskeletal: Normal range of motion.  Neurological: She is alert and oriented to person, place, and time. She has normal strength. No cranial nerve deficit or sensory deficit. Coordination normal. GCS eye subscore is 4. GCS verbal subscore is 5. GCS motor subscore is 6.  Skin: Skin is warm, dry and intact. No rash noted. No cyanosis.  3mm  skin tag left side of neck/shoulder area with a piece of thread tied around it. Tender to the touch.  Psychiatric: She has a normal mood and affect. Her speech is normal and behavior is normal. Thought content normal.    ED Course  Procedures (including critical care time)  Labs Reviewed - No data to display No results found.   Diagnosis: Accidental methadone overdose    MDM  Patient presented to the ER at the recommendation of further methadone clinic because she was inadvertently given 2-1/2 times her normal dose this morning. She arrived 3 hours after the ingestion. She was observed for an extended period of time with no sedation or fracture of the methadone. She was discharged.  Patient has a skin tag on her left neck area. She had tied a string around the skin tag in an attempt to make it fall off. It is becoming more painful. She could not get string off. This was cut off with an 11 blade. There is no sign of infection. Local care to the area. Return if worsens.       Gilda Crease, MD 02/14/13 [phone]

## 2013-03-20 ENCOUNTER — Telehealth: Payer: Self-pay | Admitting: Emergency Medicine

## 2013-03-20 ENCOUNTER — Encounter: Payer: Self-pay | Admitting: Sports Medicine

## 2013-03-20 ENCOUNTER — Ambulatory Visit (INDEPENDENT_AMBULATORY_CARE_PROVIDER_SITE_OTHER): Payer: BC Managed Care – PPO | Admitting: Sports Medicine

## 2013-03-20 VITALS — BP 135/91 | HR 67 | Temp 98.0°F | Ht 67.0 in | Wt 211.8 lb

## 2013-03-20 DIAGNOSIS — F1910 Other psychoactive substance abuse, uncomplicated: Secondary | ICD-10-CM

## 2013-03-20 MED ORDER — PROMETHAZINE HCL 25 MG PO TABS: 25.0000 mg | ORAL_TABLET | Freq: Three times a day (TID) | ORAL | Status: DC | PRN

## 2013-03-20 MED ORDER — DICYCLOMINE HCL 10 MG PO CAPS: 10.0000 mg | ORAL_CAPSULE | Freq: Three times a day (TID) | ORAL | Status: DC

## 2013-03-20 NOTE — Patient Instructions (Addendum)
It was nice to see you today.   Today we discussed: 1. SUBSTANCE ABUSE, MULTIPLE I have rx'd: - promethazine (PHENERGAN) 25 MG tablet; Take 1 tablet (25 mg total) by mouth every 8 (eight) hours as needed for nausea.  Dispense: 20 tablet; Refill: 0 - dicyclomine (BENTYL) 10 MG capsule; Take 1 capsule (10 mg total) by mouth 4 (four) times daily -  before meals and at bedtime.  Dispense: 60 capsule; Refill: 0   Please plan to return to see Dr. Elwyn Reach within the next month for routine follow up.    Please Bring all medications with you to each appointment.

## 2013-03-20 NOTE — Progress Notes (Signed)
  [facility]  Patient name: [name] MRN [id number]  Date of birth: 03-18-1981  CC & HPI:  [name] is a 32 y.o. female presenting today for nausea, vomiting, diarrhea, and abdominal pain associated with cessation of methadone for opioid dependence.  She was initially on 40 mg and and subsequently cut back to 20 mg approximate week ago.  She went from 20mg  to "cold [place]" 3 days ago and is now experiencing significant side effects.  She is previously detox at [facility] and benefited from Bentyl and Phenergan for similar symptoms.  She is here to be evaluated for consideration of these medications.  Some chills but no fevers.  She denies any cough congestion.  She has been able to tolerate by mouth fluids. She denies any orthostasis.  She is currently not using any other substances; and has quit smoking at this time.   ROS:  Per history of present illness  Pertinent History Reviewed:  Medical & Surgical Hx:  Reviewed: Significant for polysubstance abuse,  Anxiety, depression, prior knee surgery requiring narcotics resulting in abuse Medications: Reviewed & Updated - see associated section Social History: Reviewed -  reports that she has been smoking Cigarettes.  She has been smoking about 0.30 packs per day. She has never used smokeless tobacco.  Objective Findings:  Vitals: BP 135/91  Pulse 67  Temp(Src) 98 F (36.7 C) (Oral)  Ht 5\' 7"  (1.702 m)  Wt 211 lb 12.8 oz (96.072 kg)  BMI 33.16 kg/m2  LMP 03/13/2013  PE: GENERAL:  Adult Caucasian  female. In no discomfort; no respiratory distress. PSYCH: Alert and appropriately interactive; Insight:Good   H&N: AT/[place], trachea midline EENT:  MMM, no scleral icterus, EOMi HEART: RRR, S1/S2 heard, no murmur LUNGS: CTA B, no wheezes, no crackles Abdomen: hyperactive BS, soft, non-tender, no rigidity EXTREMITIES: Moves all 4 extremities spontaneously, warm well perfused, no edema, bilateral DP and PT pulses 2/4.     Assessment & Plan:

## 2013-03-20 NOTE — Telephone Encounter (Signed)
Patient forgot to ask for a note for work for her appt today.  Please call her when it is ready to be picked up.

## 2013-03-20 NOTE — Assessment & Plan Note (Addendum)
Here with current acute withdrawal symptoms from methadone.  Discussed red flags with her for further evaluation. Rx for Bentyl and Phenergan by mouth Patient to followup with Dr. Elwyn Reach in 1 month for routine checkup and to re-eval psychiatric hx/sx

## 2013-03-20 NOTE — Telephone Encounter (Signed)
Letter was written and pt informed that it was placed upfront for pick up. [name], Virgel Bouquet

## 2013-03-21 ENCOUNTER — Ambulatory Visit: Payer: BC Managed Care – PPO | Admitting: Family Medicine

## 2013-05-29 ENCOUNTER — Other Ambulatory Visit: Payer: Self-pay | Admitting: Family Medicine

## 2013-06-26 ENCOUNTER — Encounter: Payer: Self-pay | Admitting: Family Medicine

## 2013-06-26 ENCOUNTER — Ambulatory Visit (INDEPENDENT_AMBULATORY_CARE_PROVIDER_SITE_OTHER): Payer: BC Managed Care – PPO | Admitting: Family Medicine

## 2013-06-26 VITALS — BP 143/97 | HR 66 | Temp 98.9°F | Ht 67.0 in | Wt 199.0 lb

## 2013-06-26 DIAGNOSIS — M25569 Pain in unspecified knee: Secondary | ICD-10-CM

## 2013-06-26 DIAGNOSIS — M25561 Pain in right knee: Secondary | ICD-10-CM

## 2013-06-26 DIAGNOSIS — F1910 Other psychoactive substance abuse, uncomplicated: Secondary | ICD-10-CM

## 2013-06-26 MED ORDER — PROMETHAZINE HCL 25 MG PO TABS: 25.0000 mg | ORAL_TABLET | Freq: Three times a day (TID) | ORAL | Status: DC | PRN

## 2013-06-26 MED ORDER — DICYCLOMINE HCL 10 MG PO CAPS: 10.0000 mg | ORAL_CAPSULE | Freq: Three times a day (TID) | ORAL | Status: DC

## 2013-06-26 NOTE — Patient Instructions (Addendum)
Please attend your appointment with your PCP.  You may use OTC NSAIDS (ibuprofen, Aleve) and/or Tylenol for your pain.

## 2013-06-26 NOTE — Progress Notes (Signed)
Subjective:     Patient ID: [name], female   DOB: October 14, 1981, 32 y.o.   MRN: [id number]  HPI Ms. Tinner is a 32 year old female with history of Polysubstance abuse (per medical record) who presents with right knee pain.   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  She has had continued right knee pain since then and has been on Norco for this.   - Her Orthopedist has been filling her Norco, but he recently informed her that she needs to see her PCP for pain medication. - Patient reports moderate pain today and is requesting Tramadol.   Review of Systems No fevers, chills. Reports R knee pain and some associated swelling.     Objective:   Physical Exam Filed Vitals:   06/26/13 1607  BP: 143/97  Pulse: 66  Temp: 98.9 F (37.2 C)   General: well appearing, NAD. MSK: R knee - good ROM; no crepitus appreciated upon movement. No ligamentous laxity appreciated.  No erythema, swelling, or effusion.     Assessment:     See Problem list     Plan:

## 2013-06-27 ENCOUNTER — Ambulatory Visit: Payer: BC Managed Care – PPO | Admitting: Family Medicine

## 2013-06-27 DIAGNOSIS — M25561 Pain in right knee: Secondary | ICD-10-CM | POA: Insufficient documentation

## 2013-06-27 NOTE — Assessment & Plan Note (Signed)
This appears to be a chronic problem for the patient. Given benign exam, recent Methodone overdose and withdrawal as well as substance abuse history, I do not think that prescribing narcotics would be in the best interest for the patient.  I recommend NSAID's and patient refused stating that she is allergic (n/v).  I discussed this with patient and informed her that I would not be prescribing Tramadol or other narcotics.  When I did this the patient got defensive and upset and requested an appointment with her PCP and her discharge paperwork.  I arranged and appointment and the patient threw the appointment card at the [facility] Maralyn Sago) when it was handed to her.  Patient then left abruptly from the office.  *Of note, patient "yelled" at the front desk staff during scheduling over the phone and originally scheduled this visit for sinus problems/BP.  Once placed in a room the patient changed her story and reported that she was here for knee pain.  Additionally, the patient was argumentative and disrespectful to me during the office visit.

## 2013-07-17 ENCOUNTER — Ambulatory Visit: Payer: BC Managed Care – PPO | Admitting: Emergency Medicine

## 2013-07-24 ENCOUNTER — Encounter: Payer: Self-pay | Admitting: *Deleted

## 2013-07-26 ENCOUNTER — Emergency Department (HOSPITAL_BASED_OUTPATIENT_CLINIC_OR_DEPARTMENT_OTHER)
Admission: EM | Admit: 2013-07-26 | Discharge: 2013-07-27 | Disposition: A | Discharge status: Home / Self Care | Payer: BC Managed Care – PPO | Attending: Emergency Medicine | Admitting: Emergency Medicine

## 2013-07-26 ENCOUNTER — Encounter (HOSPITAL_BASED_OUTPATIENT_CLINIC_OR_DEPARTMENT_OTHER): Payer: Self-pay | Admitting: Emergency Medicine

## 2013-07-26 DIAGNOSIS — F1123 Opioid dependence with withdrawal: Secondary | ICD-10-CM

## 2013-07-26 DIAGNOSIS — Z8679 Personal history of other diseases of the circulatory system: Secondary | ICD-10-CM | POA: Insufficient documentation

## 2013-07-26 DIAGNOSIS — R1110 Vomiting, unspecified: Secondary | ICD-10-CM | POA: Insufficient documentation

## 2013-07-26 DIAGNOSIS — Z79899 Other long term (current) drug therapy: Secondary | ICD-10-CM | POA: Insufficient documentation

## 2013-07-26 DIAGNOSIS — I10 Essential (primary) hypertension: Secondary | ICD-10-CM

## 2013-07-26 DIAGNOSIS — F411 Generalized anxiety disorder: Secondary | ICD-10-CM | POA: Insufficient documentation

## 2013-07-26 DIAGNOSIS — F1120 Opioid dependence, uncomplicated: Secondary | ICD-10-CM | POA: Insufficient documentation

## 2013-07-26 DIAGNOSIS — F19939 Other psychoactive substance use, unspecified with withdrawal, unspecified: Secondary | ICD-10-CM | POA: Insufficient documentation

## 2013-07-26 DIAGNOSIS — F17200 Nicotine dependence, unspecified, uncomplicated: Secondary | ICD-10-CM | POA: Insufficient documentation

## 2013-07-26 DIAGNOSIS — F909 Attention-deficit hyperactivity disorder, unspecified type: Secondary | ICD-10-CM | POA: Insufficient documentation

## 2013-07-26 DIAGNOSIS — F1193 Opioid use, unspecified with withdrawal: Secondary | ICD-10-CM

## 2013-07-26 HISTORY — DX: Essential (primary) hypertension: I10

## 2013-07-26 LAB — URINALYSIS, ROUTINE W REFLEX MICROSCOPIC
Specific Gravity, Urine: 1.03 (ref 1.005–1.030)
Urobilinogen, UA: 1 mg/dL (ref 0.0–1.0)

## 2013-07-26 LAB — COMPREHENSIVE METABOLIC PANEL
AST: 40 U/L — ABNORMAL HIGH (ref 0–37)
Albumin: 4.2 g/dL (ref 3.5–5.2)
CO2: 23 mEq/L (ref 19–32)
Calcium: 10 mg/dL (ref 8.4–10.5)
Creatinine, Ser: 1.2 mg/dL — ABNORMAL HIGH (ref 0.50–1.10)
GFR calc non Af Amer: 59 mL/min — ABNORMAL LOW (ref >90)
Total Protein: 8.5 g/dL — ABNORMAL HIGH (ref 6.0–8.3)

## 2013-07-26 LAB — RAPID URINE DRUG SCREEN, HOSP PERFORMED
Benzodiazepines: POSITIVE — AB
Cocaine: NOT DETECTED
Opiates: POSITIVE — AB
Tetrahydrocannabinol: NOT DETECTED

## 2013-07-26 LAB — URINE MICROSCOPIC-ADD ON

## 2013-07-26 LAB — CBC WITH DIFFERENTIAL/PLATELET
Basophils Absolute: 0 10*3/uL (ref 0.0–0.1)
Basophils Relative: 0 % (ref 0–1)
Eosinophils Absolute: 0 10*3/uL (ref 0.0–0.7)
Eosinophils Relative: 0 % (ref 0–5)
HCT: 45.3 % (ref 36.0–46.0)
MCH: 28.5 pg (ref 26.0–34.0)
MCHC: 34.7 g/dL (ref 30.0–36.0)
MCV: 82.2 fL (ref 78.0–100.0)
Monocytes Absolute: 1.4 10*3/uL — ABNORMAL HIGH (ref 0.1–1.0)
Platelets: 383 10*3/uL (ref 150–400)
RDW: 13.3 % (ref 11.5–15.5)

## 2013-07-26 MED ORDER — DIPHENHYDRAMINE HCL 50 MG/ML IJ SOLN: 12.5000 mg | Freq: Once | INTRAMUSCULAR | Status: AC

## 2013-07-26 MED ORDER — CLONIDINE HCL 0.1 MG PO TABS
ORAL_TABLET | ORAL | Status: AC
  Administered 2013-07-26: 0.2 mg via ORAL
  Filled 2013-07-26: qty 2

## 2013-07-26 MED ORDER — LORAZEPAM 2 MG/ML IJ SOLN
INTRAMUSCULAR | Status: AC
  Administered 2013-07-26: 1 mg via INTRAVENOUS
  Filled 2013-07-26: qty 1

## 2013-07-26 MED ORDER — CLONIDINE HCL 0.1 MG PO TABS: 0.2000 mg | ORAL_TABLET | Freq: Once | ORAL | Status: AC

## 2013-07-26 MED ORDER — DIPHENHYDRAMINE HCL 50 MG/ML IJ SOLN
INTRAMUSCULAR | Status: AC
  Filled 2013-07-26: qty 1

## 2013-07-26 MED ORDER — SODIUM CHLORIDE 0.9 % IV SOLN
INTRAVENOUS | Status: DC
  Administered 2013-07-26: 10 mL/h via INTRAVENOUS

## 2013-07-26 MED ORDER — LORAZEPAM 2 MG/ML IJ SOLN: 1.0000 mg | Freq: Once | INTRAMUSCULAR | Status: AC

## 2013-07-26 MED ORDER — ONDANSETRON HCL 4 MG/2ML IJ SOLN: 4.0000 mg | Freq: Once | INTRAMUSCULAR | Status: AC

## 2013-07-26 MED ORDER — CLONIDINE HCL 0.1 MG PO TABS
0.2000 mg | ORAL_TABLET | Freq: Once | ORAL | Status: AC
  Administered 2013-07-26: 0.2 mg via ORAL
  Filled 2013-07-26: qty 2

## 2013-07-26 MED ORDER — ONDANSETRON 8 MG PO TBDP: 8.0000 mg | ORAL_TABLET | Freq: Three times a day (TID) | ORAL | Status: DC | PRN

## 2013-07-26 MED ORDER — PROMETHAZINE HCL 25 MG RE SUPP: 25.0000 mg | Freq: Four times a day (QID) | RECTAL | Status: DC | PRN

## 2013-07-26 MED ORDER — METOCLOPRAMIDE HCL 5 MG/ML IJ SOLN: 10.0000 mg | Freq: Once | INTRAMUSCULAR | Status: AC

## 2013-07-26 MED ORDER — CLONIDINE HCL 0.2 MG PO TABS: 0.1000 mg | ORAL_TABLET | Freq: Two times a day (BID) | ORAL | Status: DC

## 2013-07-26 MED ORDER — PROMETHAZINE HCL 25 MG PO TABS: 25.0000 mg | ORAL_TABLET | Freq: Four times a day (QID) | ORAL | Status: DC | PRN

## 2013-07-26 MED ORDER — METOCLOPRAMIDE HCL 5 MG/ML IJ SOLN
INTRAMUSCULAR | Status: AC
  Administered 2013-07-26: 10 mg via INTRAVENOUS
  Filled 2013-07-26: qty 2

## 2013-07-26 MED ORDER — ONDANSETRON HCL 4 MG/2ML IJ SOLN
INTRAMUSCULAR | Status: AC
  Administered 2013-07-26: 4 mg via INTRAVENOUS
  Filled 2013-07-26: qty 2

## 2013-07-26 NOTE — ED Provider Notes (Signed)
CSN: [id number]     Arrival date & time 07/26/13  2038 History     First MD Initiated Contact with Patient 07/26/13 2147     Chief Complaint  Patient presents with  . Hypertension  . Emesis   (Consider location/radiation/quality/duration/timing/severity/associated sxs/prior Treatment) Patient is a 32 y.o. female presenting with hypertension and vomiting. The history is provided by the patient. No language interpreter was used.  Hypertension This is a chronic problem. The current episode started today. Associated symptoms include vomiting. Nothing aggravates the symptoms. She has tried nothing for the symptoms.  Emesis Pt stopped using heroin on Monday.  Pt complains of vomiting.  Pt reports her blood pressure is up because she has not been able to take her clonidine.   Past Medical History  Diagnosis Date  . Anxiety   . Depression   . ADHD (attention deficit hyperactivity disorder)   . Polysubstance abuse     - Hx ETOH, cocaine, THC, IV heroin, narcotics  . Knee pain, bilateral     - patellofemoral syndrome, followed by Dr. [name] at [facility]  . Migraines   . Borderline systolic HTN   . Psychiatric care     Previous [facility] H&P notes PMHx Schizophrenia, though patient denies this.  . Hypertension    History reviewed. No pertinent past surgical history. No family history on file. History  Substance Use Topics  . Smoking status: Current Every Day Smoker -- 0.30 packs/day    Types: Cigarettes  . Smokeless tobacco: Never Used  . Alcohol Use: No   OB History   Grav Para Term Preterm Abortions TAB SAB Ect Mult Living                 Review of Systems  Gastrointestinal: Positive for vomiting.  All other systems reviewed and are negative.    Allergies  Review of patient's allergies indicates no known allergies.  Home Medications   Current Outpatient Rx  Name  Route  Sig  Dispense  Refill  . cloNIDine (CATAPRES) 0.2 MG tablet      TAKE 1/2 TABLET BY MOUTH 3 TIMES A DAY  45 tablet   0     Needs appt at [facility] before further refills given   . dicyclomine (BENTYL) 10 MG capsule   Oral   Take 1 capsule (10 mg total) by mouth 4 (four) times daily -  before meals and at bedtime.   60 capsule   0   . promethazine (PHENERGAN) 25 MG tablet   Oral   Take 1 tablet (25 mg total) by mouth every 8 (eight) hours as needed for nausea.   20 tablet   0    BP 154/115  Pulse 121  Temp(Src) 98.7 F (37.1 C) (Oral)  Resp 24  Ht 5\' 7"  (1.702 m)  Wt 195 lb (88.451 kg)  BMI 30.53 kg/m2  SpO2 98% Physical Exam  Nursing note and vitals reviewed. Constitutional: She is oriented to person, place, and time. She appears well-nourished.  HENT:  Head: Normocephalic.  Right Ear: External ear normal.  Left Ear: External ear normal.  Nose: Nose normal.  Eyes: Pupils are equal, round, and reactive to light.  Neck: Normal range of motion. Neck supple.  Cardiovascular: Normal rate.   Pulmonary/Chest: Effort normal.  Abdominal: Soft.  Musculoskeletal: Normal range of motion.  Neurological: She is alert and oriented to person, place, and time. She has normal reflexes.  Skin: Skin is warm.  Psychiatric: She has a normal mood  and affect.    ED Course   Procedures (including critical care time)  Labs Reviewed  CBC WITH DIFFERENTIAL  COMPREHENSIVE METABOLIC PANEL  ETHANOL  URINE RAPID DRUG SCREEN (HOSP PERFORMED)  URINALYSIS, ROUTINE W REFLEX MICROSCOPIC  PREGNANCY, URINE   No results found. No diagnosis found.  MDM  Iv fluids, labs,  Pt given clonidine po.   Pt had vomiting after medications.  Pt given ativan, reglan and benadryl,  Pt's care turned over to Dr. Rosalia Hammers.  Lonia Skinner Douds, PA-C 07/26/13 2255

## 2013-07-26 NOTE — ED Notes (Signed)
MD at bedside. 

## 2013-07-26 NOTE — ED Notes (Signed)
Pt reports that she has not used heroin since Monday, has had nausea and vomiting since tues, h/o htn, has not had clonidine since tues, no medication left she needs a new refill. Pt reports that she moved her from [place] to get aware from using friends, currently detoxing herself at her fathers house, pt is not requesting treatment, she is requesting assistance with bp medication and nausea, vss

## 2013-07-26 NOTE — ED Notes (Signed)
Pt also states she has been out of clonidine since sun.

## 2013-07-26 NOTE — ED Notes (Signed)
Pt c/o vomiting, abd cramping and chills. Pt states she quit using heroin "cold [place]" on mon. Pt states last IV injection of heroin was mon at 3pm.

## 2013-07-26 NOTE — ED Provider Notes (Signed)
This is a 32 year old female with a history of heroin abuse who is attempting to detox. She has last used 48 hours ago. She is normally on clonidine for hypertension. She's had nausea and vomiting. Here her nausea and vomiting were controlled medications and she has not been able to keep her clonidine down. She is given IV fluids and feels improved. She is discharged with prescription for anti-emetics, clonidine, and referral to day [facility]. She voices understanding of the plan and is here with her father who is her support system.   I performed a history and physical examination of [name] and discussed her management with Ms. Sofia.  I agree with the history, physical, assessment, and plan of care, with the following exceptions: None  I was present for the following procedures: None Time Spent in Critical Care of the patient: None Time spent in discussions with the patient and family: 10  Marshella Tello Corlis Leak, MD 07/26/13 [phone]

## 2013-07-27 NOTE — ED Provider Notes (Signed)
History/physical exam/procedure(s) were performed by non-physician practitioner and as supervising physician I was immediately available for consultation/collaboration. I have reviewed all notes and am in agreement with care and plan.   Alaysiah Browder S Tay Whitwell, MD 07/27/13 1443 

## 2013-08-26 ENCOUNTER — Observation Stay (HOSPITAL_COMMUNITY): Payer: BC Managed Care – PPO

## 2013-08-26 ENCOUNTER — Encounter (HOSPITAL_COMMUNITY): Payer: Self-pay | Admitting: Emergency Medicine

## 2013-08-26 ENCOUNTER — Inpatient Hospital Stay (HOSPITAL_COMMUNITY)
Admission: EM | Admit: 2013-08-26 | Discharge: 2013-08-28 | DRG: 813 | Disposition: A | Discharge status: Rehabilitation Facility | Payer: BC Managed Care – PPO | Attending: Internal Medicine | Admitting: Internal Medicine

## 2013-08-26 DIAGNOSIS — K5289 Other specified noninfective gastroenteritis and colitis: Principal | ICD-10-CM

## 2013-08-26 DIAGNOSIS — I10 Essential (primary) hypertension: Secondary | ICD-10-CM

## 2013-08-26 DIAGNOSIS — F1910 Other psychoactive substance abuse, uncomplicated: Secondary | ICD-10-CM

## 2013-08-26 DIAGNOSIS — R112 Nausea with vomiting, unspecified: Secondary | ICD-10-CM

## 2013-08-26 DIAGNOSIS — Z79899 Other long term (current) drug therapy: Secondary | ICD-10-CM

## 2013-08-26 DIAGNOSIS — K529 Noninfective gastroenteritis and colitis, unspecified: Secondary | ICD-10-CM

## 2013-08-26 DIAGNOSIS — E860 Dehydration: Secondary | ICD-10-CM

## 2013-08-26 DIAGNOSIS — R1115 Cyclical vomiting syndrome unrelated to migraine: Secondary | ICD-10-CM

## 2013-08-26 DIAGNOSIS — D72829 Elevated white blood cell count, unspecified: Secondary | ICD-10-CM | POA: Diagnosis present

## 2013-08-26 DIAGNOSIS — F3289 Other specified depressive episodes: Secondary | ICD-10-CM | POA: Diagnosis present

## 2013-08-26 DIAGNOSIS — F341 Dysthymic disorder: Secondary | ICD-10-CM | POA: Diagnosis present

## 2013-08-26 DIAGNOSIS — F329 Major depressive disorder, single episode, unspecified: Secondary | ICD-10-CM | POA: Diagnosis present

## 2013-08-26 DIAGNOSIS — R300 Dysuria: Secondary | ICD-10-CM

## 2013-08-26 DIAGNOSIS — N390 Urinary tract infection, site not specified: Secondary | ICD-10-CM

## 2013-08-26 DIAGNOSIS — R030 Elevated blood-pressure reading, without diagnosis of hypertension: Secondary | ICD-10-CM

## 2013-08-26 DIAGNOSIS — Z23 Encounter for immunization: Secondary | ICD-10-CM

## 2013-08-26 DIAGNOSIS — F17200 Nicotine dependence, unspecified, uncomplicated: Secondary | ICD-10-CM | POA: Diagnosis present

## 2013-08-26 DIAGNOSIS — J069 Acute upper respiratory infection, unspecified: Secondary | ICD-10-CM

## 2013-08-26 DIAGNOSIS — M25561 Pain in right knee: Secondary | ICD-10-CM

## 2013-08-26 DIAGNOSIS — F411 Generalized anxiety disorder: Secondary | ICD-10-CM | POA: Diagnosis present

## 2013-08-26 DIAGNOSIS — F909 Attention-deficit hyperactivity disorder, unspecified type: Secondary | ICD-10-CM | POA: Diagnosis present

## 2013-08-26 DIAGNOSIS — G5603 Carpal tunnel syndrome, bilateral upper limbs: Secondary | ICD-10-CM

## 2013-08-26 DIAGNOSIS — F1110 Opioid abuse, uncomplicated: Secondary | ICD-10-CM | POA: Diagnosis present

## 2013-08-26 DIAGNOSIS — A084 Viral intestinal infection, unspecified: Secondary | ICD-10-CM

## 2013-08-26 LAB — COMPREHENSIVE METABOLIC PANEL
ALT: 59 U/L — ABNORMAL HIGH (ref 0–35)
Albumin: 4.5 g/dL (ref 3.5–5.2)
Alkaline Phosphatase: 83 U/L (ref 39–117)
Potassium: 3.6 mEq/L (ref 3.5–5.1)
Sodium: 135 mEq/L (ref 135–145)
Total Protein: 8.4 g/dL — ABNORMAL HIGH (ref 6.0–8.3)

## 2013-08-26 LAB — URINALYSIS, ROUTINE W REFLEX MICROSCOPIC
Bilirubin Urine: NEGATIVE
Glucose, UA: NEGATIVE mg/dL
Hgb urine dipstick: NEGATIVE
Ketones, ur: 40 mg/dL — AB
Leukocytes, UA: NEGATIVE
Nitrite: NEGATIVE
Protein, ur: NEGATIVE mg/dL
Specific Gravity, Urine: 1.022 (ref 1.005–1.030)
Urobilinogen, UA: 0.2 mg/dL (ref 0.0–1.0)
pH: 6.5 (ref 5.0–8.0)

## 2013-08-26 LAB — ETHANOL: Alcohol, Ethyl (B): <11 mg/dL (ref 0–11)

## 2013-08-26 LAB — RAPID URINE DRUG SCREEN, HOSP PERFORMED
Amphetamines: NOT DETECTED
Tetrahydrocannabinol: NOT DETECTED

## 2013-08-26 LAB — CBC WITH DIFFERENTIAL/PLATELET
Basophils Absolute: 0 10*3/uL (ref 0.0–0.1)
Basophils Relative: 0 % (ref 0–1)
Eosinophils Absolute: 0 10*3/uL (ref 0.0–0.7)
MCH: 29.2 pg (ref 26.0–34.0)
MCHC: 35.4 g/dL (ref 30.0–36.0)
Neutrophils Relative %: 86 % — ABNORMAL HIGH (ref 43–77)
Platelets: 409 10*3/uL — ABNORMAL HIGH (ref 150–400)

## 2013-08-26 LAB — LIPASE, BLOOD: Lipase: 30 U/L (ref 11–59)

## 2013-08-26 MED ORDER — ENOXAPARIN SODIUM 40 MG/0.4ML ~~LOC~~ SOLN
40.0000 mg | SUBCUTANEOUS | Status: DC
  Administered 2013-08-27: 40 mg via SUBCUTANEOUS
  Filled 2013-08-26 (×3): qty 0.4

## 2013-08-26 MED ORDER — SODIUM CHLORIDE 0.9 % IV BOLUS (SEPSIS)
1000.0000 mL | Freq: Once | INTRAVENOUS | Status: AC
  Administered 2013-08-26: 1000 mL via INTRAVENOUS

## 2013-08-26 MED ORDER — SODIUM CHLORIDE 0.9 % IV SOLN
INTRAVENOUS | Status: DC
  Administered 2013-08-26 – 2013-08-28 (×4): via INTRAVENOUS
  Filled 2013-08-26 (×7): qty 1000

## 2013-08-26 MED ORDER — BUPROPION HCL ER (XL) 150 MG PO TB24
150.0000 mg | ORAL_TABLET | Freq: Every day | ORAL | Status: DC
  Administered 2013-08-27 – 2013-08-28 (×2): 150 mg via ORAL
  Filled 2013-08-26 (×2): qty 1

## 2013-08-26 MED ORDER — TRAZODONE HCL 50 MG PO TABS
50.0000 mg | ORAL_TABLET | Freq: Every day | ORAL | Status: DC
  Filled 2013-08-26 (×3): qty 1

## 2013-08-26 MED ORDER — PROMETHAZINE HCL 25 MG/ML IJ SOLN
25.0000 mg | Freq: Once | INTRAMUSCULAR | Status: AC
  Administered 2013-08-26: 25 mg via INTRAVENOUS
  Filled 2013-08-26: qty 1

## 2013-08-26 MED ORDER — ONDANSETRON HCL 4 MG/2ML IJ SOLN
4.0000 mg | Freq: Once | INTRAMUSCULAR | Status: AC
  Administered 2013-08-26: 4 mg via INTRAVENOUS
  Filled 2013-08-26: qty 2

## 2013-08-26 MED ORDER — ACETAMINOPHEN 650 MG RE SUPP: 650.0000 mg | Freq: Four times a day (QID) | RECTAL | Status: DC | PRN

## 2013-08-26 MED ORDER — LORAZEPAM 2 MG/ML IJ SOLN
1.0000 mg | Freq: Once | INTRAMUSCULAR | Status: AC
  Administered 2013-08-26: 1 mg via INTRAVENOUS
  Filled 2013-08-26: qty 1

## 2013-08-26 MED ORDER — ONDANSETRON HCL 4 MG PO TABS: 4.0000 mg | ORAL_TABLET | Freq: Four times a day (QID) | ORAL | Status: DC | PRN

## 2013-08-26 MED ORDER — METOPROLOL TARTRATE 1 MG/ML IV SOLN
2.5000 mg | Freq: Four times a day (QID) | INTRAVENOUS | Status: DC
  Administered 2013-08-26 – 2013-08-28 (×7): 2.5 mg via INTRAVENOUS
  Filled 2013-08-26 (×11): qty 5

## 2013-08-26 MED ORDER — METOCLOPRAMIDE HCL 5 MG/ML IJ SOLN
5.0000 mg | Freq: Three times a day (TID) | INTRAMUSCULAR | Status: DC | PRN
  Administered 2013-08-27 (×2): 5 mg via INTRAVENOUS
  Filled 2013-08-26 (×2): qty 2

## 2013-08-26 MED ORDER — INFLUENZA VAC SPLIT QUAD 0.5 ML IM SUSP
0.5000 mL | INTRAMUSCULAR | Status: AC
  Administered 2013-08-27: 0.5 mL via INTRAMUSCULAR
  Filled 2013-08-26 (×2): qty 0.5

## 2013-08-26 MED ORDER — ONDANSETRON 8 MG/NS 50 ML IVPB
8.0000 mg | Freq: Four times a day (QID) | INTRAVENOUS | Status: DC | PRN
  Administered 2013-08-27 (×2): 8 mg via INTRAVENOUS
  Filled 2013-08-26 (×3): qty 8

## 2013-08-26 MED ORDER — ONDANSETRON HCL 4 MG PO TABS: 4.0000 mg | ORAL_TABLET | Freq: Four times a day (QID) | ORAL | Status: DC

## 2013-08-26 MED ORDER — ACETAMINOPHEN 325 MG PO TABS
650.0000 mg | ORAL_TABLET | Freq: Four times a day (QID) | ORAL | Status: DC | PRN
  Filled 2013-08-26: qty 2

## 2013-08-26 NOTE — ED Notes (Signed)
Pt made aware of need for urine speciman states can not void at this time

## 2013-08-26 NOTE — ED Notes (Signed)
Pt ambulated to restroom. 

## 2013-08-26 NOTE — ED Notes (Signed)
I just gave Zofran IV for c/o nausea.  She ambulates to b.r., where she states she voided and had a diarrhea stool.  She had some leakage of diarrhea into her clothing, for which our tech., Marchelle Folks assists her to change and we get her some paper scrubs to wear home.

## 2013-08-26 NOTE — ED Notes (Signed)
Pt from [facility].  C/o NVD since 1 am today.  C/o gen abd pain.

## 2013-08-26 NOTE — ED Notes (Signed)
She has just vomited again--physician notified and order rec'd. For IV Phenergan, which I give.   She thanks [place] for our care.

## 2013-08-26 NOTE — ED Notes (Addendum)
Spoke with Inetta Fermo at [facility] to make him aware pt will be admitted to hospital.

## 2013-08-26 NOTE — ED Provider Notes (Signed)
CSN: [id number]     Arrival date & time 08/26/13  1116 History   First MD Initiated Contact with Patient 08/26/13 1200     Chief Complaint  Patient presents with  . Nausea  . Emesis  . Diarrhea  . Abdominal Pain   (Consider location/radiation/quality/duration/timing/severity/associated sxs/prior Treatment) Patient is a 32 y.o. female presenting with abdominal pain.  Abdominal Pain Pain location:  Epigastric Pain quality: cramping   Pain radiates to:  Does not radiate Pain severity:  Moderate Onset quality:  Gradual Duration:  14 hours Timing:  Constant Progression:  Waxing and waning Chronicity:  New Context: awakening from sleep and retching   Context: not diet changes, not eating, not previous surgeries, not recent illness, not recent sexual activity, not recent travel, not sick contacts, not suspicious food intake and not trauma   Relieved by:  Nothing Worsened by:  Vomiting Ineffective treatments:  None tried Associated symptoms: diarrhea, nausea and vomiting   Associated symptoms: no chest pain, no chills, no cough, no dysuria, no fatigue, no fever, no shortness of breath and no sore throat   Diarrhea:    Number of occurrences:  10   Severity:  Moderate   Duration:  1 day   Timing:  Intermittent   Progression:  Unchanged Nausea:    Severity:  Severe   Onset quality:  Unable to specify   Duration:  14 hours   Timing:  Constant Vomiting:    Quality:  Bilious material and stomach contents   Number of occurrences:  15   Severity:  Severe   Duration:  1 day   Timing:  Intermittent   Progression:  Unchanged Risk factors: no alcohol abuse, no aspirin use, not elderly, has not had multiple surgeries, no NSAID use, not obese and no recent hospitalization     Past Medical History  Diagnosis Date  . Anxiety   . Depression   . ADHD (attention deficit hyperactivity disorder)   . Polysubstance abuse     - Hx ETOH, cocaine, THC, IV heroin, narcotics  . Knee pain,  bilateral     - patellofemoral syndrome, followed by Dr. [name] at [facility]  . Migraines   . Borderline systolic HTN   . Psychiatric care     Previous [facility] H&P notes PMHx Schizophrenia, though patient denies this.  . Hypertension    No past surgical history on file. No family history on file. History  Substance Use Topics  . Smoking status: Current Every Day Smoker -- 0.30 packs/day    Types: Cigarettes  . Smokeless tobacco: Never Used  . Alcohol Use: No   OB History   Grav Para Term Preterm Abortions TAB SAB Ect Mult Living                 Review of Systems  Constitutional: Negative for fever, chills, diaphoresis, activity change, appetite change and fatigue.  HENT: Negative for congestion, sore throat, facial swelling, rhinorrhea, neck pain and neck stiffness.   Eyes: Negative for photophobia and discharge.  Respiratory: Negative for cough, chest tightness and shortness of breath.   Cardiovascular: Negative for chest pain, palpitations and leg swelling.  Gastrointestinal: Positive for nausea, vomiting, abdominal pain and diarrhea.  Endocrine: Negative for polydipsia and polyuria.  Genitourinary: Negative for dysuria, frequency, difficulty urinating and pelvic pain.  Musculoskeletal: Negative for back pain and arthralgias.  Skin: Negative for color change and wound.  Allergic/Immunologic: Negative for immunocompromised state.  Neurological: Negative for facial asymmetry, weakness, numbness and headaches.  Hematological: Does not bruise/bleed easily.  Psychiatric/Behavioral: Negative for confusion and agitation.    Allergies  Review of patient's allergies indicates no known allergies.  Home Medications   Current Outpatient Rx  Name  Route  Sig  Dispense  Refill  . alum & mag hydroxide-simeth (MAALOX/MYLANTA) 200-200-20 MG/5ML suspension   Oral   Take 15 mLs by mouth every 6 (six) hours as needed for indigestion.         . Benzocaine 10 MG LOZG   Mouth/Throat   Use as  directed 1 lozenge in the mouth or throat every 4 (four) hours as needed (for sore throat).         . benzonatate (TESSALON) 100 MG capsule   Oral   Take 100 mg by mouth 3 (three) times daily as needed for cough.         . bisacodyl (DULCOLAX) 10 MG suppository   Rectal   Place 10 mg rectally as needed for constipation.         Marland Kitchen buPROPion (WELLBUTRIN XL) 150 MG 24 hr tablet   Oral   Take 150 mg by mouth daily.         . celecoxib (CELEBREX) 200 MG capsule   Oral   Take 200 mg by mouth 2 (two) times daily.         . cetirizine (ZYRTEC) 10 MG tablet   Oral   Take 10 mg by mouth daily.         . Fructose-Dextrose-Phosphor Acd (NAUSEA CONTROL) 1.87-1.87-21.5 SOLN   Oral   Take 5-10 mLs by mouth See admin instructions. Every 15 minutes until relief         . guaiFENesin (MUCINEX) 600 MG 12 hr tablet   Oral   Take 1,200 mg by mouth 2 (two) times daily.         . hydrochlorothiazide (HYDRODIURIL) 25 MG tablet   Oral   Take 25 mg by mouth daily.         Marland Kitchen ibuprofen (ADVIL,MOTRIN) 600 MG tablet   Oral   Take 600 mg by mouth every 6 (six) hours as needed for pain.         Marland Kitchen loperamide (IMODIUM A-D) 2 MG tablet   Oral   Take 2 mg by mouth 4 (four) times daily as needed for diarrhea or loose stools.         . metoprolol succinate (TOPROL-XL) 100 MG 24 hr tablet   Oral   Take 100 mg by mouth daily. Take with or immediately following a meal.         . ondansetron (ZOFRAN ODT) 8 MG disintegrating tablet   Oral   Take 1 tablet (8 mg total) by mouth every 8 (eight) hours as needed for nausea.   20 tablet   0   . traZODone (DESYREL) 50 MG tablet   Oral   Take 50 mg by mouth at bedtime.         . ondansetron (ZOFRAN) 4 MG tablet   Oral   Take 1 tablet (4 mg total) by mouth every 6 (six) hours.   12 tablet   0    BP 160/95  Pulse 84  Temp(Src) 99.2 F (37.3 C) (Oral)  Resp 20  SpO2 100%  LMP 07/26/2013 Physical Exam  Constitutional: She is  oriented to person, place, and time. She appears well-developed and well-nourished. No distress.  HENT:  Head: Normocephalic and atraumatic.  Mouth/Throat: No oropharyngeal exudate.  Eyes:  Pupils are equal, round, and reactive to light.  Neck: Normal range of motion. Neck supple.  Cardiovascular: Regular rhythm and normal heart sounds.  Tachycardia present.  Exam reveals no gallop and no friction rub.   No murmur heard. Pulmonary/Chest: Effort normal and breath sounds normal. No respiratory distress. She has no wheezes. She has no rales.  Abdominal: Soft. Bowel sounds are normal. She exhibits no distension and no mass. There is no tenderness. There is no rebound and no guarding.  Musculoskeletal: Normal range of motion. She exhibits no edema and no tenderness.  Neurological: She is alert and oriented to person, place, and time.  Skin: Skin is warm and dry.  Psychiatric: She has a normal mood and affect.    ED Course  Procedures (including critical care time) Labs Review Labs Reviewed  CBC WITH DIFFERENTIAL - Abnormal; Notable for the following:    WBC 14.6 (*)    RBC 5.55 (*)    Hemoglobin 16.2 (*)    Platelets 409 (*)    Neutrophils Relative % 86 (*)    Neutro Abs 12.6 (*)    Lymphocytes Relative 11 (*)    Monocytes Relative 2 (*)    All other components within normal limits  COMPREHENSIVE METABOLIC PANEL - Abnormal; Notable for the following:    Glucose, Bld 133 (*)    BUN 27 (*)    Total Protein 8.4 (*)    ALT 59 (*)    All other components within normal limits  URINALYSIS, ROUTINE W REFLEX MICROSCOPIC - Abnormal; Notable for the following:    APPearance CLOUDY (*)    Ketones, ur 40 (*)    All other components within normal limits  POCT PREGNANCY, URINE   Imaging Review No results found.  2:44 PM Pt actively vomiting again, will try phenergan.   MDM   1. Viral gastroenteritis   2. Refractory nausea and vomiting   3. Dehydration    Pt is a 32 y.o. female with  Pmhx as above who presents with about 12 hours of crampy ab pain, nausea vom x15, d/a x10.  No blood in emesis or stool, no fever, no sick contacts, trauma, questionable ingestions, no drug use for 3 months. On PE, VSS, pt in NAD.  Mucus membranes tacky.  Mild epigastric ttp on exam.  1L IVF, zofran basic labs & urine ordered.  Pt found to have mild leukocytosis, mild ALT elevation. Stable Cr, elevated BUN.  Urine not infected.  I suspect viral gastroenteritis, will hydrated, treat symptomatically.   Pt still actively vomiting in dept after zofran x2, IV phenergan, & IV ativan.  HR improved, BP stable.  [facility] consulted for admission given refractory symptoms and inability to tolerate PO.   1. Viral gastroenteritis   2. Refractory nausea and vomiting   3. Dehydration         Shanna Cisco, MD 08/27/13 [phone]

## 2013-08-26 NOTE — H&P (Signed)
[facility] History and Physical  [name] ZOX:[id number] DOB: 1981/09/05 DOA: 08/26/2013   PCP: No primary provider on file.   Chief Complaint: Intractable vomiting  HPI:  32 year old female with a history of heroin abuse currently finishing detox at [facility], hypertension, depression presents with recurrent vomiting. The patient had too numerous episodes the counter. This began around 1 AM this morning. Prior to her vomiting and diarrhea, the patient had been in her usual state of health.  The patient denies any sick contacts. There's been no report of undercooked or raw foods or pets. There's been no travels. To her knowledge, no one else at [facility] is ill. The patient denies any recent changes in her medications. The patient states that she has been sober for over 3 weeks. She is not currently on any maintenance medications for her drug abuse. The patient has some subjective fevers and chills. The patient had no abdominal pain until she had innumerable episodes of vomiting at which point she began having abdominal cramping. She has also had approximately 6 loose stools. There is no hematemesis, hematochezia, melena. There has not been any recent antibiotic use. The patient denies any other illegal drugs or alcohol use. Because of the patient's emesis, she has not taken any of her medications today.  In the ED, the patient had numerous episodes of emesis. The patient was given a total of 8 mg of Zofran, 25 mg IV promethazine, 1 mg Ativan. At the time of my arrival, the patient appears to be comfortable without any further emesis. The patient was given 2L NS. Because of the patient's recurrent emesis and diarrhea and dehydration, [facility] is asked to admit. Assessment/Plan: Intractable/recurrent vomiting/diarrhea -Suspect nonspecific gastroenteritis -Abdominal exam appears to be benign; however, will check abdominal x-ray rule out ileus versus obstruction -Check GGT,  lipase -Check ethanol level -Urine drug screen -GI pathogen panel -C. difficile PCR -Metoclopramide prn intractible n/v -Continue antiemetics -Further imaging if no improvement in 24 hours -IVF Leukocytosis -Likely due to the patient's acute medical illness -Urinalysis without any pyuria -Urine pregnancy test negative -Check lactic acid -Continue to monitor, remain off antibiotics history of heroin abuse -Patient currently is not on any maintenance medications -Patient has been csober for over 3 weeks Hypertension -Discontinue HCTZ as the patient is volume depleted -Start the patient on intravenous metoprolol as she is now able to tolerate oral medications at this time -Home medication includes metoprolol succinate 100 mg daily -Discontinue NSAIDs Depression -Continue Wellbutrin if the patient is able to tolerate        Past Medical History  Diagnosis Date  . Anxiety   . Depression   . ADHD (attention deficit hyperactivity disorder)   . Polysubstance abuse     - Hx ETOH, cocaine, THC, IV heroin, narcotics  . Knee pain, bilateral     - patellofemoral syndrome, followed by Dr. [name] at [facility]  . Migraines   . Borderline systolic HTN   . Psychiatric care     Previous [facility] H&P notes PMHx Schizophrenia, though patient denies this.  . Hypertension    No past surgical history on file. Social History:  reports that she has been smoking Cigarettes.  She has been smoking about 0.30 packs per day. She has never used smokeless tobacco. She reports that she does not drink alcohol or use illicit drugs.   No family history on file.   No Known Allergies    Prior to Admission medications   Medication Sig Start  Date End Date Taking? Authorizing Provider  alum & mag hydroxide-simeth (MAALOX/MYLANTA) 200-200-20 MG/5ML suspension Take 15 mLs by mouth every 6 (six) hours as needed for indigestion.   Yes Historical Provider, MD  Benzocaine 10 MG LOZG Use as directed 1 lozenge in  the mouth or throat every 4 (four) hours as needed (for sore throat).   Yes Historical Provider, MD  benzonatate (TESSALON) 100 MG capsule Take 100 mg by mouth 3 (three) times daily as needed for cough.   Yes Historical Provider, MD  bisacodyl (DULCOLAX) 10 MG suppository Place 10 mg rectally as needed for constipation.   Yes Historical Provider, MD  buPROPion (WELLBUTRIN XL) 150 MG 24 hr tablet Take 150 mg by mouth daily.   Yes Historical Provider, MD  celecoxib (CELEBREX) 200 MG capsule Take 200 mg by mouth 2 (two) times daily.   Yes Historical Provider, MD  cetirizine (ZYRTEC) 10 MG tablet Take 10 mg by mouth daily.   Yes Historical Provider, MD  Fructose-Dextrose-Phosphor Acd (NAUSEA CONTROL) 1.87-1.87-21.5 SOLN Take 5-10 mLs by mouth See admin instructions. Every 15 minutes until relief   Yes Historical Provider, MD  guaiFENesin (MUCINEX) 600 MG 12 hr tablet Take 1,200 mg by mouth 2 (two) times daily.   Yes Historical Provider, MD  hydrochlorothiazide (HYDRODIURIL) 25 MG tablet Take 25 mg by mouth daily.   Yes Historical Provider, MD  ibuprofen (ADVIL,MOTRIN) 600 MG tablet Take 600 mg by mouth every 6 (six) hours as needed for pain.   Yes Historical Provider, MD  loperamide (IMODIUM A-D) 2 MG tablet Take 2 mg by mouth 4 (four) times daily as needed for diarrhea or loose stools.   Yes Historical Provider, MD  metoprolol succinate (TOPROL-XL) 100 MG 24 hr tablet Take 100 mg by mouth daily. Take with or immediately following a meal.   Yes Historical Provider, MD  ondansetron (ZOFRAN ODT) 8 MG disintegrating tablet Take 1 tablet (8 mg total) by mouth every 8 (eight) hours as needed for nausea. 07/26/13  Yes Hilario Quarry, MD  traZODone (DESYREL) 50 MG tablet Take 50 mg by mouth at bedtime.   Yes Historical Provider, MD  ondansetron (ZOFRAN) 4 MG tablet Take 1 tablet (4 mg total) by mouth every 6 (six) hours. 08/26/13   Shanna Cisco, MD    Review of Systems:  Constitutional:  No weight loss,  night sweats, Fevers, chills, fatigue.  Head&Eyes: No headache.  No vision loss.  No eye pain or scotoma ENT:  No Difficulty swallowing,Tooth/dental problems,Sore throat,   Cardio-vascular:  No chest pain, Orthopnea, PND, swelling in lower extremities,  dizziness,  GI:  No   loss of appetite, hematochezia, melena, heartburn,  Resp:  No shortness of breath with exertion or at rest. No cough. No coughing up of blood  Skin:  no rash or lesions.  GU:  no dysuria, change in color of urine, no urgency or frequency. No flank pain.  Musculoskeletal:  No joint pain or swelling. No decreased range of motion.   Psych:  No change in mood or affect.  Neurologic: No headache, no dysesthesia, no focal weakness, no vision loss.   Physical Exam: Filed Vitals:   08/26/13 1123 08/26/13 1447 08/26/13 1617  BP: 163/79 160/95 163/100  Pulse: 70 84 89  Temp: 97.9 F (36.6 C) 99.2 F (37.3 C) 99.5 F (37.5 C)  TempSrc: Oral Oral Oral  Resp: 18 20 14   SpO2: 100% 100% 98%   General:  A&O x 3, NAD, nontoxic,  pleasant/cooperative Head/Eye: No conjunctival hemorrhage, no icterus, [place]/AT, No nystagmus ENT:  No icterus,  No thrush, good dentition, no pharyngeal exudate Neck:  No masses, no lymphadenpathy, no bruits; no meningismus CV:  RRR, no rub, no gallop, no S3 Lung:  CTAB, good air movement, no wheeze, no rhonchi Abdomen: soft/NT, +BS, nondistended, no peritoneal signs; no costovertebral tenderness Ext: No cyanosis, No rashes, No petechiae, No lymphangitis, No edema   Labs on Admission:  Basic Metabolic Panel:  Recent Labs Lab 08/26/13 1221  NA 135  K 3.6  CL 99  CO2 23  GLUCOSE 133*  BUN 27*  CREATININE 0.70  CALCIUM 9.9   Liver Function Tests:  Recent Labs Lab 08/26/13 1221  AST 33  ALT 59*  ALKPHOS 83  BILITOT 0.3  PROT 8.4*  ALBUMIN 4.5   No results found for this basename: LIPASE, AMYLASE,  in the last 168 hours No results found for this basename: AMMONIA,  in the  last 168 hours CBC:  Recent Labs Lab 08/26/13 1221  WBC 14.6*  NEUTROABS 12.6*  HGB 16.2*  HCT 45.8  MCV 82.5  PLT 409*   Cardiac Enzymes: No results found for this basename: CKTOTAL, CKMB, CKMBINDEX, TROPONINI,  in the last 168 hours BNP: No components found with this basename: POCBNP,  CBG: No results found for this basename: GLUCAP,  in the last 168 hours  Radiological Exams on Admission: No results found.      Time spent:60 minutes Code Status:   FULL Family Communication:   Father at bedside   Tracy Sambrano, DO  [facility] Pager [phone]  If 7PM-7AM, please contact night-coverage [email or web address] Password [facility] 08/26/2013, 5:37 PM

## 2013-08-26 NOTE — Progress Notes (Signed)
Patient to floor from the ED. Report received from Theone Murdoch, RN. No change from initial report. Will continue to follow the plan of care.

## 2013-08-26 NOTE — ED Notes (Signed)
She tells me she has had several episodes of n/v/d since 0100 hours today.  She estimates she has vomited ~20 times, and has had several small diarrhea stools.  She c/o some epigastric "soreness" and is in no distress.  She knows of no family/acquaintance with similar symptoms.

## 2013-08-26 NOTE — ED Notes (Signed)
I have just phoned report to [place] on [address] [place].  Our tech. Will transport asap.

## 2013-08-27 ENCOUNTER — Observation Stay (HOSPITAL_COMMUNITY): Payer: BC Managed Care – PPO

## 2013-08-27 ENCOUNTER — Encounter (HOSPITAL_COMMUNITY): Payer: Self-pay | Admitting: Radiology

## 2013-08-27 LAB — BASIC METABOLIC PANEL
CO2: 21 mEq/L (ref 19–32)
Chloride: 101 mEq/L (ref 96–112)
Creatinine, Ser: 0.56 mg/dL (ref 0.50–1.10)
GFR calc Af Amer: >90 mL/min (ref >90)
GFR calc non Af Amer: >90 mL/min (ref >90)
Glucose, Bld: 111 mg/dL — ABNORMAL HIGH (ref 70–99)
Potassium: 3.5 mEq/L (ref 3.5–5.1)
Sodium: 133 mEq/L — ABNORMAL LOW (ref 135–145)

## 2013-08-27 LAB — CBC
HCT: 40.5 % (ref 36.0–46.0)
Hemoglobin: 13.9 g/dL (ref 12.0–15.0)
MCHC: 34.3 g/dL (ref 30.0–36.0)
MCV: 83.2 fL (ref 78.0–100.0)
RBC: 4.87 MIL/uL (ref 3.87–5.11)
RDW: 12.9 % (ref 11.5–15.5)
WBC: 17 10*3/uL — ABNORMAL HIGH (ref 4.0–10.5)

## 2013-08-27 MED ORDER — IOHEXOL 300 MG/ML  SOLN
50.0000 mL | Freq: Once | INTRAMUSCULAR | Status: AC | PRN
  Administered 2013-08-27: 50 mL via ORAL

## 2013-08-27 MED ORDER — IOHEXOL 300 MG/ML  SOLN
100.0000 mL | Freq: Once | INTRAMUSCULAR | Status: AC | PRN
  Administered 2013-08-27: 100 mL via INTRAVENOUS

## 2013-08-27 MED ORDER — ONDANSETRON HCL 4 MG/2ML IJ SOLN
4.0000 mg | Freq: Four times a day (QID) | INTRAMUSCULAR | Status: DC
  Administered 2013-08-27 – 2013-08-28 (×5): 4 mg via INTRAVENOUS
  Filled 2013-08-27 (×5): qty 2

## 2013-08-27 MED ORDER — BIOTENE DRY MOUTH MT LIQD
15.0000 mL | Freq: Two times a day (BID) | OROMUCOSAL | Status: DC
  Administered 2013-08-27 – 2013-08-28 (×3): 15 mL via OROMUCOSAL

## 2013-08-27 NOTE — Progress Notes (Signed)
[facility] PROGRESS NOTE  [name] YNW:[id number] DOB: [birth year] DOA: 08/26/2013 PCP: No primary provider on file.  Assessment/Plan: Intractable/recurrent vomiting/diarrhea  -Suspect nonspecific gastroenteritis  -Abdomina x-ray negativel  -Check GGT--28 -lipase--30 -Check ethanol level--negative -Urine drug screen--negative  -GI pathogen panel--no further diarrhea to collect  -C. difficile PCR--no further diarrhea to collect -Metoclopramide prn intractible n/v  -Continue antiemetics--schedule antiemetics  - CT abdomen without contrast -IVF  Leukocytosis  -Likely due to the patient's acute medical illness  -Urinalysis without any pyuria  -Urine pregnancy test negative  -Continue to monitor, remain off antibiotics - CT abdomen without contrast  history of heroin abuse  -Patient currently is not on any maintenance medications  -Patient has been sober for over 3 weeks  Hypertension  -Discontinue HCTZ as the patient is volume depleted  -Start the patient on intravenous metoprolol as she is now able to tolerate oral medications at this time  -Home medication includes metoprolol succinate 100 mg daily  -Discontinue NSAIDs  Depression  -Continue Wellbutrin if the patient is able to tolerate  Family Communication:   Pt at beside Disposition Plan:   Home when medically stable      Procedures/Studies: Dg Abd 2 Views  08/26/2013   *RADIOLOGY REPORT*  Clinical Data: Abdominal pain with intractable vomiting  ABDOMEN - 2 VIEW  Comparison: Chest radiograph 08/23/2012  Findings: The imaged lung bases are clear.  The heart size is normal.  No evidence of free air.  Bowel gas pattern is nonobstructive.  No urinary tract calculus is seen.  No abdominal mass effect.  No acute osseous abnormality.  IMPRESSION:  1.  Nonobstructive bowel gas pattern. 2.  No acute cardiopulmonary disease.   Original Report Authenticated By: Britta Mccreedy, M.D.         Subjective: Patient  states her diarrhea has improved. She has some abdominal cramping. Continues to have vomiting without any hematemesis. Denies any fevers, chills, chest pain, shortness breath, headache, dizziness.  Objective: Filed Vitals:   08/27/13 0036 08/27/13 0205 08/27/13 0559 08/27/13 0634  BP: 159/92 167/94 160/96 136/84  Pulse: 81 76 72 72  Temp:   98.9 F (37.2 C)   TempSrc:   Oral   Resp:   18   Height:      Weight:      SpO2:   98%     Intake/Output Summary (Last 24 hours) at 08/27/13 0933 Last data filed at 08/27/13 0606  Gross per 24 hour  Intake 1026.67 ml  Output   1000 ml  Net  26.67 ml   Weight change:  Exam:   General:  Pt is alert, follows commands appropriately, not in acute distress  HEENT: No icterus, No thrush,  [place]/AT  Cardiovascular: RRR, S1/S2, no rubs, no gallops  Respiratory: CTA bilaterally, no wheezing, no crackles, no rhonchi  Abdomen: Soft/+BS, non tender, non distended, no guarding  Extremities: No edema, No lymphangitis, No petechiae, No rashes, no synovitis  Data Reviewed: Basic Metabolic Panel:  Recent Labs Lab 08/26/13 1221 08/27/13 0555  NA 135 133*  K 3.6 3.5  CL 99 101  CO2 23 21  GLUCOSE 133* 111*  BUN 27* 16  CREATININE 0.70 0.56  CALCIUM 9.9 9.2   Liver Function Tests:  Recent Labs Lab 08/26/13 1221  AST 33  ALT 59*  ALKPHOS 83  BILITOT 0.3  PROT 8.4*  ALBUMIN 4.5    Recent Labs Lab 08/26/13 2000  LIPASE 30   No results found for this basename:  AMMONIA,  in the last 168 hours CBC:  Recent Labs Lab 08/26/13 1221 08/27/13 0555  WBC 14.6* 17.0*  NEUTROABS 12.6*  --   HGB 16.2* 13.9  HCT 45.8 40.5  MCV 82.5 83.2  PLT 409* 356   Cardiac Enzymes: No results found for this basename: CKTOTAL, CKMB, CKMBINDEX, TROPONINI,  in the last 168 hours BNP: No components found with this basename: POCBNP,  CBG: No results found for this basename: GLUCAP,  in the last 168 hours  No results found for this or any  previous visit (from the past 240 hour(s)).   Scheduled Meds: . antiseptic oral rinse  15 mL Mouth Rinse BID  . buPROPion  150 mg Oral Daily  . enoxaparin (LOVENOX) injection  40 mg Subcutaneous Q24H  . influenza vac split quadrivalent PF  0.5 mL Intramuscular Tomorrow-1000  . metoprolol  2.5 mg Intravenous Q6H  . traZODone  50 mg Oral QHS   Continuous Infusions: . sodium chloride 0.9 % 1,000 mL with potassium chloride 20 mEq infusion 100 mL/hr at 08/27/13 0604     Tracy Borras, DO  [facility] Pager [phone]  If 7PM-7AM, please contact night-coverage [email or web address] Password [facility] 08/27/2013, 9:33 AM   LOS: 1 day

## 2013-08-28 LAB — COMPREHENSIVE METABOLIC PANEL
ALT: 64 U/L — ABNORMAL HIGH (ref 0–35)
AST: 42 U/L — ABNORMAL HIGH (ref 0–37)
Albumin: 3.5 g/dL (ref 3.5–5.2)
Alkaline Phosphatase: 70 U/L (ref 39–117)
BUN: 12 mg/dL (ref 6–23)
CO2: 22 mEq/L (ref 19–32)
Chloride: 100 mEq/L (ref 96–112)
Creatinine, Ser: 0.63 mg/dL (ref 0.50–1.10)
GFR calc Af Amer: >90 mL/min (ref >90)
GFR calc non Af Amer: >90 mL/min (ref >90)
Glucose, Bld: 102 mg/dL — ABNORMAL HIGH (ref 70–99)
Potassium: 3.7 mEq/L (ref 3.5–5.1)
Total Bilirubin: 0.4 mg/dL (ref 0.3–1.2)

## 2013-08-28 LAB — CBC
HCT: 41.6 % (ref 36.0–46.0)
Hemoglobin: 14.4 g/dL (ref 12.0–15.0)
MCHC: 34.6 g/dL (ref 30.0–36.0)
MCV: 82.4 fL (ref 78.0–100.0)
RDW: 12.8 % (ref 11.5–15.5)
WBC: 13.3 10*3/uL — ABNORMAL HIGH (ref 4.0–10.5)

## 2013-08-28 MED ORDER — METOPROLOL SUCCINATE ER 100 MG PO TB24
100.0000 mg | ORAL_TABLET | Freq: Every day | ORAL | Status: DC
  Administered 2013-08-28: 100 mg via ORAL
  Filled 2013-08-28: qty 1

## 2013-08-28 NOTE — Progress Notes (Signed)
UR completed.  Algernon Huxley, RN BSN   [phone]

## 2013-08-28 NOTE — Progress Notes (Signed)
Pt ready for d/c. Per Jeanice Lim with social work, d/c instructions have been faxed to [facility]. PIV removed, WNL. Pt reports 1 episode of diarrhea since eating lunch, was not collected. Denies n/v. Pt d/c'd to [facility], via transport from facility. Eugene Garnet RN

## 2013-08-28 NOTE — Discharge Summary (Signed)
Physician Discharge Summary  [name] ZOX:[id number] DOB: [birth year] DOA: 08/26/2013  PCP: No primary provider on file.  Admit date: 08/26/2013 Discharge date: 08/28/2013  Recommendations for Outpatient Follow-up:  1. Pt will need to follow up with PCP in 2 weeks post discharge 2. Please obtain BMP to evaluate electrolytes and kidney function 3. Please also check CBC to evaluate Hg and Hct levels   Discharge Diagnoses:  Active Problems:   ANXIETY DEPRESSION   Persistent recurrent vomiting   Dehydration   Acute gastroenteritis   Unspecified essential hypertension Intractable/recurrent vomiting/diarrhea  - Diarrhea resolved less than 24 hours after admission to the hospital. Therefore stool studies were not collected. -Suspect nonspecific gastroenteritis  -Abdomina x-ray negative -Check GGT--28  -lipase--30   -Check ethanol level--negative  -Urine drug screen--negative  -GI pathogen panel--no further diarrhea to collect  -C. difficile PCR--no further diarrhea to collect  -Metoclopramide prn intractible n/v  -Continue antiemetics--schedule antiemetics  - CT abdomen without contrast--  Negative for any acute intra-abdominal process -IVF--continued throughout the hospitalization  - The patient's vomiting improved. Her diet was advanced to bland diet. She was able tolerate her diet without any further vomiting.  Leukocytosis  -Likely due to the patient's acute medical illness  - The patient's WBCs peaked at 17.0. Blood cultures were negative. Abdominal and chest x-rays were negative. CT abdomen and pelvis was negative. The patient's WBC count gradually decreased without antibiotics.. She remained afebrile and hemodynamically stable.  -Urinalysis without any pyuria  -Urine pregnancy test negative  - remain off antibiotics  history of heroin abuse  -Patient currently is not on any maintenance medications  -Patient has been sober for over 3 weeks  Hypertension  -Discontinue  HCTZ as the patient is volume depleted  -Started the patient on intravenous metoprolol as she  was unable to tolerate oral medications -Home medication includes metoprolol succinate 100 mg daily--restarted on the day of discharge. The patient was able to tolerate the medication.  -Discontinue NSAIDs  - Patient was instructed to stop taking her ibuprofen. She can take her Celebrex as needed at the time of discharge.  Depression  -Continue Wellbutrin  Family Communication: Pt at beside  Disposition Plan: Home when medically stable   Discharge Condition: stable  Disposition: home Follow-up Information   Follow up with [facility]. (If symptoms worsen including worsening pain, inability to eat or drink)    Specialty:  Emergency Medicine   Contact information:   [address] [place] [id number] [place] [address] [phone]      Diet:bland diet to start Wt Readings from Last 3 Encounters:  08/26/13 86.864 kg (191 lb 8 oz)  07/26/13 88.451 kg (195 lb)  06/26/13 90.266 kg (199 lb)    History of present illness:  32 year old female with a history of heroin abuse currently finishing detox at [facility], hypertension, depression presents with recurrent vomiting. The patient had too numerous episodes the counter. This began around 1 AM this morning. Prior to her vomiting and diarrhea, the patient had been in her usual state of health. The patient denies any sick contacts. There's been no report of undercooked or raw foods or pets. There's been no travels. To her knowledge, no one else at [facility] is ill. The patient denies any recent changes in her medications. The patient states that she has been sober for over 3 weeks. She is not currently on any maintenance medications for her drug abuse. The patient has some subjective fevers and chills.  The patient had no abdominal pain until she had innumerable episodes of vomiting at which point she  began having abdominal cramping. She has also had approximately 6 loose stools. There is no hematemesis, hematochezia, melena. There has not been any recent antibiotic use. The patient denies any other illegal drugs or alcohol use. Because of the patient's emesis, she has not taken any of her medications today.  In the ED, the patient had numerous episodes of emesis. The patient was given a total of 8 mg of Zofran, 25 mg IV promethazine, 1 mg Ativan. At the time of my arrival, the patient appears to be comfortable without any further emesis. The patient was given 2L NS. Because of the patient's recurrent emesis and diarrhea and dehydration, [facility] is asked to admit.  The patient was started on intravenous fluids and antiemetics. She was made n.p.o. Initially. On her vomiting improved, the patient was started on a full liquid diet and advanced as tolerated. The patient did not have any diarrhea throughout her hospitalization. Therefore stool studies were not collected. The patient's vomiting gradually improved. She remained nauseous. Her diet was advanced to a bland diet. She was able tolerate that without further vomiting. She was able to take her oral medications. Although her WBC count remained mildly elevated it decreased prior to discharge. The patient remained afebrile and hemodynamically stable.    Discharge Exam: Filed Vitals:   08/28/13 0516  BP: 142/87  Pulse: 65  Temp: 98.4 F (36.9 C)  Resp: 14   Filed Vitals:   08/27/13 1425 08/27/13 2127 08/27/13 2310 08/28/13 0516  BP: 163/98 150/99 153/91 142/87  Pulse:  75 80 65  Temp: 99.1 F (37.3 C) 98.5 F (36.9 C)  98.4 F (36.9 C)  TempSrc: Oral Oral  Oral  Resp: 18 16  14   Height:      Weight:      SpO2: 98% 97%  96%   General: A&O x 3, NAD, pleasant, cooperative Cardiovascular: RRR, no rub, no gallop, no S3 Respiratory: CTAB, no wheeze, no rhonchi Abdomen:soft, nontender, nondistended, positive bowel sounds Extremities: No  edema, No lymphangitis, no petechiae  Discharge Instructions      Discharge Orders   Future Orders Complete By Expires   Diet - low sodium heart healthy  As directed    Increase activity slowly  As directed        Medication List    STOP taking these medications       ibuprofen 600 MG tablet  Commonly known as:  ADVIL,MOTRIN      TAKE these medications       alum & mag hydroxide-simeth 200-200-20 MG/5ML suspension  Commonly known as:  MAALOX/MYLANTA  Take 15 mLs by mouth every 6 (six) hours as needed for indigestion.     Benzocaine 10 MG Lozg  Use as directed 1 lozenge in the mouth or throat every 4 (four) hours as needed (for sore throat).     benzonatate 100 MG capsule  Commonly known as:  TESSALON  Take 100 mg by mouth 3 (three) times daily as needed for cough.     bisacodyl 10 MG suppository  Commonly known as:  DULCOLAX  Place 10 mg rectally as needed for constipation.     buPROPion 150 MG 24 hr tablet  Commonly known as:  WELLBUTRIN XL  Take 150 mg by mouth daily.     celecoxib 200 MG capsule  Commonly known as:  CELEBREX  Take 200 mg by mouth  2 (two) times daily.     cetirizine 10 MG tablet  Commonly known as:  ZYRTEC  Take 10 mg by mouth daily.     guaiFENesin 600 MG 12 hr tablet  Commonly known as:  MUCINEX  Take 1,200 mg by mouth 2 (two) times daily.     hydrochlorothiazide 25 MG tablet  Commonly known as:  HYDRODIURIL  Take 25 mg by mouth daily.     loperamide 2 MG tablet  Commonly known as:  IMODIUM A-D  Take 2 mg by mouth 4 (four) times daily as needed for diarrhea or loose stools.     metoprolol succinate 100 MG 24 hr tablet  Commonly known as:  TOPROL-XL  Take 100 mg by mouth daily. Take with or immediately following a meal.     Nausea Control 1.87-1.87-21.5 Soln  Take 5-10 mLs by mouth See admin instructions. Every 15 minutes until relief     ondansetron 4 MG tablet  Commonly known as:  ZOFRAN  Take 1 tablet (4 mg total) by mouth  every 6 (six) hours.     ondansetron 8 MG disintegrating tablet  Commonly known as:  ZOFRAN ODT  Take 1 tablet (8 mg total) by mouth every 8 (eight) hours as needed for nausea.     traZODone 50 MG tablet  Commonly known as:  DESYREL  Take 50 mg by mouth at bedtime.         The results of significant diagnostics from this hospitalization (including imaging, microbiology, ancillary and laboratory) are listed below for reference.    Significant Diagnostic Studies: Ct Abdomen Pelvis W Contrast  08/27/2013   CLINICAL DATA:  Generalized abdominal pain and tenderness  EXAM: CT ABDOMEN AND PELVIS WITH CONTRAST  TECHNIQUE: Multidetector CT imaging of the abdomen and pelvis was performed using the standard protocol following bolus administration of intravenous contrast.  CONTRAST:  OMNIPAQUE IOHEXOL 300 MG/ML  SOLN  COMPARISON:  None.  FINDINGS: The lung bases are clear. No pleural or pericardial effusion.  There is no focal liver abnormality identified. The gallbladder is normal. No biliary dilatation. Normal appearance of the appendix. The spleen is unremarkable.  The adrenal glands are both normal. Normal appearance of the kidneys. The urinary bladder appears normal. Uterus is normal. Bilateral ovarian cysts are identified. The largest is in the left ovary measuring 3.4 cm. There is no complicating feature associated with this cyst.  Normal caliber of the abdominal aorta. No aneurysm. No upper abdominal adenopathy. There is no pelvic or inguinal adenopathy. No free fluid or abnormal fluid collections identified.  The stomach and the small bowel loops appear within normal limits. The appendix is visualized and is normal. Normal appearance of the colon.  Review of the visualized osseous structures is on unremarkable.  IMPRESSION: 1. No acute findings identified. 2. Bilateral ovarian cysts.   Electronically Signed   By: Signa Kell M.D.   On: 08/27/2013 18:35   Dg Abd 2 Views  08/26/2013    *RADIOLOGY REPORT*  Clinical Data: Abdominal pain with intractable vomiting  ABDOMEN - 2 VIEW  Comparison: Chest radiograph 08/23/2012  Findings: The imaged lung bases are clear.  The heart size is normal.  No evidence of free air.  Bowel gas pattern is nonobstructive.  No urinary tract calculus is seen.  No abdominal mass effect.  No acute osseous abnormality.  IMPRESSION:  1.  Nonobstructive bowel gas pattern. 2.  No acute cardiopulmonary disease.   Original Report Authenticated By: Britta Mccreedy,  M.D.     Microbiology: Recent Results (from the past 240 hour(s))  CULTURE, BLOOD (ROUTINE X 2)     Status: None   Collection Time    08/27/13  1:10 PM      Result Value Range Status   Specimen Description BLOOD RIGHT FOOT   Final   Special Requests BOTTLES DRAWN AEROBIC ONLY 1CC AEROBIC ONLY   Final   Culture  Setup Time     Final   Value: 08/27/2013 21:44     Performed at [facility]   Culture     Final   Value:        BLOOD CULTURE RECEIVED NO GROWTH TO DATE CULTURE WILL BE HELD FOR 5 DAYS BEFORE ISSUING A FINAL NEGATIVE REPORT     Performed at [facility]   Report Status PENDING   Incomplete  CULTURE, BLOOD (ROUTINE X 2)     Status: None   Collection Time    08/27/13  1:22 PM      Result Value Range Status   Specimen Description BLOOD RIGHT FOOT   Final   Special Requests BOTTLES DRAWN AEROBIC ONLY 1CC AEROBIC ONLY   Final   Culture  Setup Time     Final   Value: 08/27/2013 21:44     Performed at [facility]   Culture     Final   Value:        BLOOD CULTURE RECEIVED NO GROWTH TO DATE CULTURE WILL BE HELD FOR 5 DAYS BEFORE ISSUING A FINAL NEGATIVE REPORT     Performed at [facility]   Report Status PENDING   Incomplete     Labs: Basic Metabolic Panel:  Recent Labs Lab 08/26/13 1221 08/27/13 0555 08/28/13 0548  NA 135 133* 133*  K 3.6 3.5 3.7  CL 99 101 100  CO2 23 21 22   GLUCOSE 133* 111* 102*  BUN 27* 16 12  CREATININE 0.70 0.56 0.63   CALCIUM 9.9 9.2 8.9   Liver Function Tests:  Recent Labs Lab 08/26/13 1221 08/28/13 0548  AST 33 42*  ALT 59* 64*  ALKPHOS 83 70  BILITOT 0.3 0.4  PROT 8.4* 7.2  ALBUMIN 4.5 3.5    Recent Labs Lab 08/26/13 2000  LIPASE 30   No results found for this basename: AMMONIA,  in the last 168 hours CBC:  Recent Labs Lab 08/26/13 1221 08/27/13 0555 08/28/13 0548  WBC 14.6* 17.0* 13.3*  NEUTROABS 12.6*  --   --   HGB 16.2* 13.9 14.4  HCT 45.8 40.5 41.6  MCV 82.5 83.2 82.4  PLT 409* 356 348   Cardiac Enzymes: No results found for this basename: CKTOTAL, CKMB, CKMBINDEX, TROPONINI,  in the last 168 hours BNP: No components found with this basename: POCBNP,  CBG: No results found for this basename: GLUCAP,  in the last 168 hours  Time coordinating discharge:  Greater than 30 minutes  Signed:  Kathrine Rieves, DO [facility] Pager: [phone] 08/28/2013, 10:36 AM

## 2013-08-28 NOTE — Progress Notes (Signed)
Clinical Social Work Department BRIEF PSYCHOSOCIAL ASSESSMENT 08/28/2013  Patient:  [name], [name]     Account Number:  [id number]     Admit date:  08/26/2013  Clinical Social Worker:  Dennison Bulla  Date/Time:  08/28/2013 10:45 AM  Referred by:  Physician  Date Referred:  08/28/2013 Referred for  Substance Abuse   Other Referral:   Interview type:  Patient Other interview type:    PSYCHOSOCIAL DATA Living Status:  FACILITY Admitted from facility:  [facility] Level of care:   Primary support name:  Dianne Primary support relationship to patient:  PARENT Degree of support available:   Adequate    CURRENT CONCERNS Current Concerns  Substance Abuse   Other Concerns:   Patient admitted from [facility]    SOCIAL WORK ASSESSMENT / PLAN CSW received referral due to patient being admitted from [facility]. CSW reviewed chart and met with patient at bedside. CSW introduced myself and explained role.    Patient reports she was at [facility] in order to get treatment for substance use. Patient reports she has not yet completed the program and would like to return at DC. CSW asked patient to sign ROI form so that CSW could fax DC summary to [facility]. Patient agreeable and understanding of plan.    CSW spoke with Wellington Hampshire at [facility] who reports they can admit patient back to program today. RN requests that [facility] come around 1400. [facility] will call unit when they have arrived and wait for RN to bring patient to entrance. Patient, RN and [facility] all aware and agreeable to plan.    CSW is signing off but available if further needs arise.   Assessment/plan status:  No Further Intervention Required Other assessment/ plan:   N/A   Information/referral to community resources:   To return to [facility]    PATIENT'S/FAMILY'S RESPONSE TO PLAN OF CARE: Patient alert and oriented. Patient thanked CSW for time and is  happy that she gets to return to [facility] today.       [place], [place] [phone]

## 2013-08-29 ENCOUNTER — Encounter (HOSPITAL_COMMUNITY): Payer: Self-pay | Admitting: *Deleted

## 2013-08-29 ENCOUNTER — Emergency Department (HOSPITAL_COMMUNITY)
Admission: EM | Admit: 2013-08-29 | Discharge: 2013-08-29 | Disposition: A | Discharge status: Home / Self Care | Payer: BC Managed Care – PPO | Attending: Emergency Medicine | Admitting: Emergency Medicine

## 2013-08-29 DIAGNOSIS — Z79899 Other long term (current) drug therapy: Secondary | ICD-10-CM | POA: Insufficient documentation

## 2013-08-29 DIAGNOSIS — F329 Major depressive disorder, single episode, unspecified: Secondary | ICD-10-CM | POA: Insufficient documentation

## 2013-08-29 DIAGNOSIS — F909 Attention-deficit hyperactivity disorder, unspecified type: Secondary | ICD-10-CM | POA: Insufficient documentation

## 2013-08-29 DIAGNOSIS — I10 Essential (primary) hypertension: Secondary | ICD-10-CM | POA: Insufficient documentation

## 2013-08-29 DIAGNOSIS — R112 Nausea with vomiting, unspecified: Secondary | ICD-10-CM

## 2013-08-29 DIAGNOSIS — Z3202 Encounter for pregnancy test, result negative: Secondary | ICD-10-CM | POA: Insufficient documentation

## 2013-08-29 DIAGNOSIS — F411 Generalized anxiety disorder: Secondary | ICD-10-CM | POA: Insufficient documentation

## 2013-08-29 DIAGNOSIS — F3289 Other specified depressive episodes: Secondary | ICD-10-CM | POA: Insufficient documentation

## 2013-08-29 DIAGNOSIS — F1910 Other psychoactive substance abuse, uncomplicated: Secondary | ICD-10-CM | POA: Insufficient documentation

## 2013-08-29 DIAGNOSIS — F17200 Nicotine dependence, unspecified, uncomplicated: Secondary | ICD-10-CM | POA: Insufficient documentation

## 2013-08-29 LAB — COMPREHENSIVE METABOLIC PANEL
ALT: 120 U/L — ABNORMAL HIGH (ref 0–35)
AST: 60 U/L — ABNORMAL HIGH (ref 0–37)
Alkaline Phosphatase: 76 U/L (ref 39–117)
CO2: 24 mEq/L (ref 19–32)
Calcium: 9.3 mg/dL (ref 8.4–10.5)
Chloride: 99 mEq/L (ref 96–112)
GFR calc Af Amer: >90 mL/min (ref >90)
GFR calc non Af Amer: >90 mL/min (ref >90)
Glucose, Bld: 112 mg/dL — ABNORMAL HIGH (ref 70–99)
Potassium: 3.6 mEq/L (ref 3.5–5.1)
Sodium: 135 mEq/L (ref 135–145)
Total Bilirubin: 0.3 mg/dL (ref 0.3–1.2)
Total Protein: 7.5 g/dL (ref 6.0–8.3)

## 2013-08-29 LAB — CBC WITH DIFFERENTIAL/PLATELET
Basophils Absolute: 0.1 10*3/uL (ref 0.0–0.1)
Lymphocytes Relative: 16 % (ref 12–46)
Lymphs Abs: 2.3 10*3/uL (ref 0.7–4.0)
MCH: 29.4 pg (ref 26.0–34.0)
Monocytes Relative: 6 % (ref 3–12)
Neutro Abs: 10.8 10*3/uL — ABNORMAL HIGH (ref 1.7–7.7)
Neutrophils Relative %: 76 % (ref 43–77)
Platelets: 350 10*3/uL (ref 150–400)
RBC: 5.13 MIL/uL — ABNORMAL HIGH (ref 3.87–5.11)
RDW: 12.6 % (ref 11.5–15.5)
WBC: 14.1 10*3/uL — ABNORMAL HIGH (ref 4.0–10.5)

## 2013-08-29 LAB — URINE MICROSCOPIC-ADD ON

## 2013-08-29 LAB — URINALYSIS, ROUTINE W REFLEX MICROSCOPIC
Glucose, UA: NEGATIVE mg/dL
Protein, ur: 30 mg/dL — AB
Specific Gravity, Urine: 1.031 — ABNORMAL HIGH (ref 1.005–1.030)
Urobilinogen, UA: 1 mg/dL (ref 0.0–1.0)
pH: 6 (ref 5.0–8.0)

## 2013-08-29 LAB — PREGNANCY, URINE: Preg Test, Ur: NEGATIVE

## 2013-08-29 MED ORDER — SODIUM CHLORIDE 0.9 % IV BOLUS (SEPSIS)
1000.0000 mL | Freq: Once | INTRAVENOUS | Status: AC
  Administered 2013-08-29: 1000 mL via INTRAVENOUS

## 2013-08-29 MED ORDER — ONDANSETRON HCL 4 MG/2ML IJ SOLN
4.0000 mg | Freq: Once | INTRAMUSCULAR | Status: AC
  Administered 2013-08-29: 4 mg via INTRAVENOUS
  Filled 2013-08-29: qty 2

## 2013-08-29 MED ORDER — METOCLOPRAMIDE HCL 5 MG/ML IJ SOLN
10.0000 mg | Freq: Once | INTRAMUSCULAR | Status: AC
Start: 2013-08-29 — End: 2013-08-29
  Administered 2013-08-29: 10 mg via INTRAVENOUS
  Filled 2013-08-29: qty 2

## 2013-08-29 MED ORDER — PROMETHAZINE HCL 25 MG PO TABS: 25.0000 mg | ORAL_TABLET | Freq: Four times a day (QID) | ORAL | Status: DC | PRN

## 2013-08-29 MED ORDER — METOCLOPRAMIDE HCL 10 MG PO TABS: 10.0000 mg | ORAL_TABLET | Freq: Four times a day (QID) | ORAL | Status: DC | PRN

## 2013-08-29 NOTE — ED Provider Notes (Signed)
CSN: [id number]     Arrival date & time 08/29/13  0449 History   First MD Initiated Contact with Patient 08/29/13 [phone]     Chief Complaint  Patient presents with  . Nausea  . Emesis   The history is provided by the patient.   patient presents emergency Department with nausea and vomiting.  She was discharged from the hospital less than 12 hours ago after 2 day hospital stay for nausea vomiting diarrhea.  Yesterday on day of discharge the patient one diarrheal stool however she had no nausea or vomiting he was able to keep oral fluids and food down yesterday.  She currently is at [facility] where she is receiving detox from heroin.  She is currently been clean and sober from heroin for the past 3 weeks and she is 5 days left at [facility].  She was discharged in the hospital yesterday and returned at [facility].  She continued to have nausea through the night and tried and 8 mg oh DT Zofran without improvement in her symptoms.  Around 4:00 this morning she began vomiting that she was brought to the ER for evaluation.  She does report some mild generalized upper abdominal discomfort.  CT scan was obtained her last hospitalization demonstrated no acute pathology and had no evidence of gallstones on the at that time.  The patient did not vomit for EMS.  Her symptoms are mild to moderate in severity.  No history of gastroparesis.  No fevers or chills.  No urinary complaints.  She states she's currently on her menstrual period  Past Medical History  Diagnosis Date  . Anxiety   . Depression   . ADHD (attention deficit hyperactivity disorder)   . Polysubstance abuse     - Hx ETOH, cocaine, THC, IV heroin, narcotics  . 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 Migraines   . Borderline systolic HTN   . Psychiatric care     Previous [facility] H&P notes PMHx Schizophrenia, though patient denies this.  . Hypertension    History reviewed. No pertinent  past surgical history. No family history on file. History  Substance Use Topics  . Smoking status: Current Every Day Smoker -- 0.30 packs/day    Types: Cigarettes  . Smokeless tobacco: Never Used  . Alcohol Use: No   OB History   Grav Para Term Preterm Abortions TAB SAB Ect Mult Living                 Review of Systems  Gastrointestinal: Positive for vomiting.  All other systems reviewed and are negative.    Allergies  Review of patient's allergies indicates no known allergies.  Home Medications   Current Outpatient Rx  Name  Route  Sig  Dispense  Refill  . buPROPion (WELLBUTRIN XL) 150 MG 24 hr tablet   Oral   Take 150 mg by mouth daily.         . cetirizine (ZYRTEC) 10 MG tablet   Oral   Take 10 mg by mouth daily as needed for allergies.          . hydrochlorothiazide (HYDRODIURIL) 25 MG tablet   Oral   Take 25 mg by mouth daily.         Marland Kitchen ibuprofen (ADVIL,MOTRIN) 600 MG tablet   Oral   Take 600 mg by mouth every 6 (six) hours as needed for pain.         Marland Kitchen  metoprolol succinate (TOPROL-XL) 100 MG 24 hr tablet   Oral   Take 100 mg by mouth daily. Take with or immediately following a meal.         . ondansetron (ZOFRAN ODT) 8 MG disintegrating tablet   Oral   Take 1 tablet (8 mg total) by mouth every 8 (eight) hours as needed for nausea.   20 tablet   0   . OVER THE COUNTER MEDICATION   Oral   Take 1 tablet by mouth daily. Multivitamin with folic acid         . traZODone (DESYREL) 50 MG tablet   Oral   Take 50 mg by mouth at bedtime.         Marland Kitchen alum & mag hydroxide-simeth (MAALOX/MYLANTA) 200-200-20 MG/5ML suspension   Oral   Take 15 mLs by mouth every 6 (six) hours as needed for indigestion.         . Benzocaine 10 MG LOZG   Mouth/Throat   Use as directed 1 lozenge in the mouth or throat every 4 (four) hours as needed (for sore throat).         . benzonatate (TESSALON) 100 MG capsule   Oral   Take 100 mg by mouth 3 (three) times  daily as needed for cough.         . Fructose-Dextrose-Phosphor Acd (NAUSEA CONTROL) 1.87-1.87-21.5 SOLN   Oral   Take 5-10 mLs by mouth See admin instructions. Every 15 minutes until relief         . guaiFENesin (MUCINEX) 600 MG 12 hr tablet   Oral   Take 1,200 mg by mouth 2 (two) times daily.         Marland Kitchen loperamide (IMODIUM A-D) 2 MG tablet   Oral   Take 2 mg by mouth 4 (four) times daily as needed for diarrhea or loose stools.         . metoCLOPramide (REGLAN) 10 MG tablet   Oral   Take 1 tablet (10 mg total) by mouth every 6 (six) hours as needed.   15 tablet   0   . promethazine (PHENERGAN) 25 MG tablet   Oral   Take 1 tablet (25 mg total) by mouth every 6 (six) hours as needed for nausea.   12 tablet   0    BP 140/99  Pulse 56  Temp(Src) 98 F (36.7 C) (Oral)  SpO2 95%  LMP 08/27/2013 Physical Exam  Nursing note and vitals reviewed. Constitutional: She is oriented to person, place, and time. She appears well-developed and well-nourished. No distress.  HENT:  Head: Normocephalic and atraumatic.  Eyes: EOM are normal.  Neck: Normal range of motion.  Cardiovascular: Normal rate, regular rhythm and normal heart sounds.   Pulmonary/Chest: Effort normal and breath sounds normal.  Abdominal: Soft. She exhibits no distension. There is no tenderness.  Musculoskeletal: Normal range of motion.  Neurological: She is alert and oriented to person, place, and time.  Skin: Skin is warm and dry.  Psychiatric: She has a normal mood and affect. Judgment normal.    ED Course  Procedures (including critical care time) Labs Review Labs Reviewed  CBC WITH DIFFERENTIAL - Abnormal; Notable for the following:    WBC 14.1 (*)    RBC 5.13 (*)    Hemoglobin 15.1 (*)    MCHC 36.1 (*)    Neutro Abs 10.8 (*)    All other components within normal limits  COMPREHENSIVE METABOLIC PANEL - Abnormal; Notable for  the following:    Glucose, Bld 112 (*)    AST 60 (*)    ALT 120 (*)     All other components within normal limits  URINALYSIS, ROUTINE W REFLEX MICROSCOPIC - Abnormal; Notable for the following:    Color, Urine AMBER (*)    APPearance CLOUDY (*)    Specific Gravity, Urine 1.031 (*)    Hgb urine dipstick SMALL (*)    Bilirubin Urine SMALL (*)    Protein, ur 30 (*)    All other components within normal limits  URINE MICROSCOPIC-ADD ON - Abnormal; Notable for the following:    Bacteria, UA FEW (*)    Crystals CA OXALATE CRYSTALS (*)    All other components within normal limits  LIPASE, BLOOD  PREGNANCY, URINE   Imaging Review Ct Abdomen Pelvis W Contrast  08/27/2013   CLINICAL DATA:  Generalized abdominal pain and tenderness  EXAM: CT ABDOMEN AND PELVIS WITH CONTRAST  TECHNIQUE: Multidetector CT imaging of the abdomen and pelvis was performed using the standard protocol following bolus administration of intravenous contrast.  CONTRAST:  OMNIPAQUE IOHEXOL 300 MG/ML  SOLN  COMPARISON:  None.  FINDINGS: The lung bases are clear. No pleural or pericardial effusion.  There is no focal liver abnormality identified. The gallbladder is normal. No biliary dilatation. Normal appearance of the appendix. The spleen is unremarkable.  The adrenal glands are both normal. Normal appearance of the kidneys. The urinary bladder appears normal. Uterus is normal. Bilateral ovarian cysts are identified. The largest is in the left ovary measuring 3.4 cm. There is no complicating feature associated with this cyst.  Normal caliber of the abdominal aorta. No aneurysm. No upper abdominal adenopathy. There is no pelvic or inguinal adenopathy. No free fluid or abnormal fluid collections identified.  The stomach and the small bowel loops appear within normal limits. The appendix is visualized and is normal. Normal appearance of the colon.  Review of the visualized osseous structures is on unremarkable.  IMPRESSION: 1. No acute findings identified. 2. Bilateral ovarian cysts.   Electronically  Signed   By: Signa Kell M.D.   On: 08/27/2013 18:35  I personally reviewed the imaging tests through PACS system I reviewed available ER/hospitalization records through the EMR   MDM   1. Nausea & vomiting    6:34 AM Patient feels much better at this time.  Abdominal exam is benign.  Labs and urine are without significant abnormality.  Discharge home in good condition.  I will discharge the patient home with Phenergan and Reglan.  She also will have Zofran.  I've asked that she return to the ER for new or worsening symptoms.    Lyanne Co, MD 08/29/13 [phone]

## 2013-08-29 NOTE — ED Notes (Signed)
[facility] notified for transport

## 2013-08-29 NOTE — ED Notes (Signed)
MD at bedside. 

## 2013-08-29 NOTE — ED Notes (Signed)
Dr. Campos at bedside   

## 2013-08-29 NOTE — ED Notes (Addendum)
Per EMS report: pt from [facility]:  Pt c/o of nausea and vomiting.  Pt was recently seen [facility] and admitted for gastroenteritis.  Pt dc from hospital yesterday.  Pt woke up around 0400 with nausea and vomiting.  Pt c/o upper abd pain.  Facility MD decided to send pt for evaluation.  Pt in rehab to detox from opioids and benzos.  EMS did not note any vomiting.  Pt observed ambulating into department without any difficulty.  Pt a/o x 4. Skin warm and dry.  The facility gave pt 8mg  of zofran sublingual prior to EMS arrival. EMS VS: BP 128/84, HR: 84, RR: 18

## 2013-08-29 NOTE — ED Notes (Signed)
Bed: ZO10 Expected date: 08/29/13 Expected time: 4:32 AM Means of arrival: Ambulance Comments: N,v

## 2013-09-02 LAB — CULTURE, BLOOD (ROUTINE X 2)
Culture: NO GROWTH
Culture: NO GROWTH

## 2013-11-23 ENCOUNTER — Emergency Department (HOSPITAL_BASED_OUTPATIENT_CLINIC_OR_DEPARTMENT_OTHER)
Admission: EM | Admit: 2013-11-23 | Discharge: 2013-11-23 | Disposition: A | Discharge status: Home / Self Care | Payer: BC Managed Care – PPO | Attending: Emergency Medicine | Admitting: Emergency Medicine

## 2013-11-23 ENCOUNTER — Encounter (HOSPITAL_BASED_OUTPATIENT_CLINIC_OR_DEPARTMENT_OTHER): Payer: Self-pay | Admitting: Emergency Medicine

## 2013-11-23 DIAGNOSIS — F3289 Other specified depressive episodes: Secondary | ICD-10-CM | POA: Insufficient documentation

## 2013-11-23 DIAGNOSIS — Z79899 Other long term (current) drug therapy: Secondary | ICD-10-CM | POA: Insufficient documentation

## 2013-11-23 DIAGNOSIS — F411 Generalized anxiety disorder: Secondary | ICD-10-CM | POA: Insufficient documentation

## 2013-11-23 DIAGNOSIS — L02519 Cutaneous abscess of unspecified hand: Secondary | ICD-10-CM | POA: Insufficient documentation

## 2013-11-23 DIAGNOSIS — F17200 Nicotine dependence, unspecified, uncomplicated: Secondary | ICD-10-CM | POA: Insufficient documentation

## 2013-11-23 DIAGNOSIS — I10 Essential (primary) hypertension: Secondary | ICD-10-CM | POA: Insufficient documentation

## 2013-11-23 DIAGNOSIS — G43909 Migraine, unspecified, not intractable, without status migrainosus: Secondary | ICD-10-CM | POA: Insufficient documentation

## 2013-11-23 DIAGNOSIS — F909 Attention-deficit hyperactivity disorder, unspecified type: Secondary | ICD-10-CM | POA: Insufficient documentation

## 2013-11-23 DIAGNOSIS — F329 Major depressive disorder, single episode, unspecified: Secondary | ICD-10-CM | POA: Insufficient documentation

## 2013-11-23 DIAGNOSIS — L03114 Cellulitis of left upper limb: Secondary | ICD-10-CM

## 2013-11-23 LAB — CBC WITH DIFFERENTIAL/PLATELET
Basophils Absolute: 0 10*3/uL (ref 0.0–0.1)
Basophils Relative: 0 % (ref 0–1)
Lymphocytes Relative: 26 % (ref 12–46)
MCH: 29.2 pg (ref 26.0–34.0)
MCHC: 34.5 g/dL (ref 30.0–36.0)
Monocytes Relative: 6 % (ref 3–12)
Neutro Abs: 7 10*3/uL (ref 1.7–7.7)
Neutrophils Relative %: 63 % (ref 43–77)
Platelets: 308 10*3/uL (ref 150–400)
RDW: 12.6 % (ref 11.5–15.5)
WBC: 11.1 10*3/uL — ABNORMAL HIGH (ref 4.0–10.5)

## 2013-11-23 LAB — BASIC METABOLIC PANEL
BUN: 13 mg/dL (ref 6–23)
CO2: 26 mEq/L (ref 19–32)
Calcium: 9.5 mg/dL (ref 8.4–10.5)
Chloride: 101 mEq/L (ref 96–112)
GFR calc non Af Amer: >90 mL/min (ref >90)
Glucose, Bld: 88 mg/dL (ref 70–99)
Potassium: 4 mEq/L (ref 3.5–5.1)
Sodium: 137 mEq/L (ref 135–145)

## 2013-11-23 MED ORDER — SODIUM CHLORIDE 0.9 % IV SOLN
Freq: Once | INTRAVENOUS | Status: AC
  Administered 2013-11-23: 15:00:00 via INTRAVENOUS

## 2013-11-23 MED ORDER — CLINDAMYCIN HCL 150 MG PO CAPS: 300.0000 mg | ORAL_CAPSULE | Freq: Three times a day (TID) | ORAL | Status: DC

## 2013-11-23 MED ORDER — CLINDAMYCIN PHOSPHATE 900 MG/50ML IV SOLN
900.0000 mg | Freq: Once | INTRAVENOUS | Status: AC
  Administered 2013-11-23: 900 mg via INTRAVENOUS
  Filled 2013-11-23: qty 50

## 2013-11-23 NOTE — ED Notes (Signed)
Pt c/o left hand pain and swelling and sts "I relapsed and injected my hand" 2 days ago.

## 2013-11-23 NOTE — ED Provider Notes (Signed)
CSN: [id number]     Arrival date & time 11/23/13  1332 History   First MD Initiated Contact with Patient 11/23/13 1417     Chief Complaint  Patient presents with  . Hand Problem   (Consider location/radiation/quality/duration/timing/severity/associated sxs/prior Treatment) Patient is a 32 y.o. female presenting with hand pain. The history is provided by the patient. No language interpreter was used.  Hand Pain This is a new problem. The current episode started in the past 7 days. Pertinent negatives include no chills, fever, nausea, numbness or vomiting. Associated symptoms comments: She complains of left hand pain and swelling for the past 2 days after IV drug use using heroin. She denies fever or pain travelling into the forearm. No drainage from the area. .    Past Medical History  Diagnosis Date  . Anxiety   . Depression   . ADHD (attention deficit hyperactivity disorder)   . Polysubstance abuse     - Hx ETOH, cocaine, THC, IV heroin, narcotics  . Knee pain, bilateral     - patellofemoral syndrome, followed by Dr. [name] at [facility]  . Migraines   . Borderline systolic HTN   . Psychiatric care     Previous [facility] H&P notes PMHx Schizophrenia, though patient denies this.  . Hypertension    History reviewed. No pertinent past surgical history. No family history on file. History  Substance Use Topics  . Smoking status: Current Every Day Smoker -- 0.30 packs/day    Types: Cigarettes  . Smokeless tobacco: Never Used  . Alcohol Use: No   OB History   Grav Para Term Preterm Abortions TAB SAB Ect Mult Living                 Review of Systems  Constitutional: Negative for fever and chills.  Gastrointestinal: Negative.  Negative for nausea and vomiting.  Musculoskeletal:       See HPI.  Skin: Positive for color change.  Neurological: Negative.  Negative for numbness.    Allergies  Review of patient's allergies indicates no known allergies.  Home Medications   Current  Outpatient Rx  Name  Route  Sig  Dispense  Refill  . buPROPion (WELLBUTRIN XL) 150 MG 24 hr tablet   Oral   Take 150 mg by mouth daily.         . hydrochlorothiazide (HYDRODIURIL) 25 MG tablet   Oral   Take 25 mg by mouth daily.         Marland Kitchen alum & mag hydroxide-simeth (MAALOX/MYLANTA) 200-200-20 MG/5ML suspension   Oral   Take 15 mLs by mouth every 6 (six) hours as needed for indigestion.         . Benzocaine 10 MG LOZG   Mouth/Throat   Use as directed 1 lozenge in the mouth or throat every 4 (four) hours as needed (for sore throat).         . benzonatate (TESSALON) 100 MG capsule   Oral   Take 100 mg by mouth 3 (three) times daily as needed for cough.         . cetirizine (ZYRTEC) 10 MG tablet   Oral   Take 10 mg by mouth daily as needed for allergies.          . Fructose-Dextrose-Phosphor Acd (NAUSEA CONTROL) 1.87-1.87-21.5 SOLN   Oral   Take 5-10 mLs by mouth See admin instructions. Every 15 minutes until relief         . guaiFENesin (MUCINEX) 600 MG 12  hr tablet   Oral   Take 1,200 mg by mouth 2 (two) times daily.         Marland Kitchen ibuprofen (ADVIL,MOTRIN) 600 MG tablet   Oral   Take 600 mg by mouth every 6 (six) hours as needed for pain.         Marland Kitchen loperamide (IMODIUM A-D) 2 MG tablet   Oral   Take 2 mg by mouth 4 (four) times daily as needed for diarrhea or loose stools.         . metoCLOPramide (REGLAN) 10 MG tablet   Oral   Take 1 tablet (10 mg total) by mouth every 6 (six) hours as needed.   15 tablet   0   . metoprolol succinate (TOPROL-XL) 100 MG 24 hr tablet   Oral   Take 100 mg by mouth daily. Take with or immediately following a meal.         . ondansetron (ZOFRAN ODT) 8 MG disintegrating tablet   Oral   Take 1 tablet (8 mg total) by mouth every 8 (eight) hours as needed for nausea.   20 tablet   0   . OVER THE COUNTER MEDICATION   Oral   Take 1 tablet by mouth daily. Multivitamin with folic acid         . promethazine  (PHENERGAN) 25 MG tablet   Oral   Take 1 tablet (25 mg total) by mouth every 6 (six) hours as needed for nausea.   12 tablet   0   . traZODone (DESYREL) 50 MG tablet   Oral   Take 50 mg by mouth at bedtime.          BP 142/88  Pulse 78  Temp(Src) 98.9 F (37.2 C) (Oral)  Resp 18  Ht 5\' 7"  (1.702 m)  Wt 190 lb (86.183 kg)  BMI 29.75 kg/m2  SpO2 100%  LMP 11/05/2013 Physical Exam  Constitutional: She is oriented to person, place, and time. She appears well-developed and well-nourished.  Neck: Normal range of motion.  Pulmonary/Chest: Effort normal.  Musculoskeletal:  Left hand markedly swollen dorsally with minimal areas of redness most notable at the 3rd through 5th MCP joints. FROM all digits and wrist. Forearm unremarkable in appearance without redness or tenderness.   Neurological: She is alert and oriented to person, place, and time.  Skin: Skin is warm and dry.    ED Course  Procedures (including critical care time) Labs Review Labs Reviewed  CBC WITH DIFFERENTIAL - Abnormal; Notable for the following:    WBC 11.1 (*)    RBC 5.20 (*)    Hemoglobin 15.2 (*)    All other components within normal limits  BASIC METABOLIC PANEL   Results for orders placed during the hospital encounter of 11/23/13  CBC WITH DIFFERENTIAL      Result Value Range   WBC 11.1 (*) 4.0 - 10.5 K/uL   RBC 5.20 (*) 3.87 - 5.11 MIL/uL   Hemoglobin 15.2 (*) 12.0 - 15.0 g/dL   HCT 16.1  09.6 - 04.5 %   MCV 84.8  78.0 - 100.0 fL   MCH 29.2  26.0 - 34.0 pg   MCHC 34.5  30.0 - 36.0 g/dL   RDW 40.9  81.1 - 91.4 %   Platelets 308  150 - 400 K/uL   Neutrophils Relative % 63  43 - 77 %   Neutro Abs 7.0  1.7 - 7.7 K/uL   Lymphocytes Relative 26  12 -  46 %   Lymphs Abs 2.9  0.7 - 4.0 K/uL   Monocytes Relative 6  3 - 12 %   Monocytes Absolute 0.6  0.1 - 1.0 K/uL   Eosinophils Relative 4  0 - 5 %   Eosinophils Absolute 0.5  0.0 - 0.7 K/uL   Basophils Relative 0  0 - 1 %   Basophils Absolute 0.0   0.0 - 0.1 K/uL  BASIC METABOLIC PANEL      Result Value Range   Sodium 137  135 - 145 mEq/L   Potassium 4.0  3.5 - 5.1 mEq/L   Chloride 101  96 - 112 mEq/L   CO2 26  19 - 32 mEq/L   Glucose, Bld 88  70 - 99 mg/dL   BUN 13  6 - 23 mg/dL   Creatinine, Ser 9.60  0.50 - 1.10 mg/dL   Calcium 9.5  8.4 - 45.4 mg/dL   GFR calc non Af Amer >90  >90 mL/min   GFR calc Af Amer >90  >90 mL/min    Imaging Review No results found.  EKG Interpretation   None       MDM  No diagnosis found. 1. Left hand cellulitis  IV antibiotics given (Clindamycin). She is comfortable, afebrile with minimal leukocytosis and no shift. She reports she does not want admission to the hospital but will go for close follow up with her PCP. Feel she is stable for discharge home.    Arnoldo Hooker, PA-C 11/23/13 1559

## 2013-11-23 NOTE — ED Provider Notes (Signed)
Medical screening examination/treatment/procedure(s) were conducted as a shared visit with non-physician practitioner(s) or resident and myself. I personally evaluated the patient during the encounter and agree with the findings and plan unless otherwise indicated.  I have personally reviewed any xrays and/ or EKG's with the provider and I agree with interpretation.  2 days of left hand swelling. IVDU for years, last time yesterday, no known mrsa hz. No fevers, vomiting or spreading rash. No cp or sob. Pain with palpation. Left hand diffusely swollen, track marks, no crepitus or induration, mild tender to palpation dorsally, no streaking, nv intact. Plan for labs, IV abx and either observation intpt vs close outpt fup with po clindamycin. Pt wishes to go home and fup outpt, pt has capacity to make decisions. Discussed getting assistance for drug addiction. IVUD, Cellulitis, Hand swelling left   Enid Skeens, MD 11/23/13 [phone]

## 2014-04-16 ENCOUNTER — Emergency Department (HOSPITAL_BASED_OUTPATIENT_CLINIC_OR_DEPARTMENT_OTHER)
Admission: EM | Admit: 2014-04-16 | Discharge: 2014-04-16 | Disposition: A | Discharge status: Home / Self Care | Payer: BC Managed Care – PPO | Attending: Emergency Medicine | Admitting: Emergency Medicine

## 2014-04-16 ENCOUNTER — Encounter (HOSPITAL_BASED_OUTPATIENT_CLINIC_OR_DEPARTMENT_OTHER): Payer: Self-pay | Admitting: Emergency Medicine

## 2014-04-16 DIAGNOSIS — Z792 Long term (current) use of antibiotics: Secondary | ICD-10-CM | POA: Insufficient documentation

## 2014-04-16 DIAGNOSIS — J029 Acute pharyngitis, unspecified: Secondary | ICD-10-CM | POA: Insufficient documentation

## 2014-04-16 DIAGNOSIS — F329 Major depressive disorder, single episode, unspecified: Secondary | ICD-10-CM | POA: Insufficient documentation

## 2014-04-16 DIAGNOSIS — N390 Urinary tract infection, site not specified: Secondary | ICD-10-CM | POA: Insufficient documentation

## 2014-04-16 DIAGNOSIS — R609 Edema, unspecified: Secondary | ICD-10-CM | POA: Insufficient documentation

## 2014-04-16 DIAGNOSIS — F1910 Other psychoactive substance abuse, uncomplicated: Secondary | ICD-10-CM

## 2014-04-16 DIAGNOSIS — F1110 Opioid abuse, uncomplicated: Secondary | ICD-10-CM | POA: Insufficient documentation

## 2014-04-16 DIAGNOSIS — Z760 Encounter for issue of repeat prescription: Secondary | ICD-10-CM | POA: Insufficient documentation

## 2014-04-16 DIAGNOSIS — F1310 Sedative, hypnotic or anxiolytic abuse, uncomplicated: Secondary | ICD-10-CM | POA: Insufficient documentation

## 2014-04-16 DIAGNOSIS — F17200 Nicotine dependence, unspecified, uncomplicated: Secondary | ICD-10-CM | POA: Insufficient documentation

## 2014-04-16 DIAGNOSIS — Z3202 Encounter for pregnancy test, result negative: Secondary | ICD-10-CM | POA: Insufficient documentation

## 2014-04-16 DIAGNOSIS — I10 Essential (primary) hypertension: Secondary | ICD-10-CM | POA: Insufficient documentation

## 2014-04-16 DIAGNOSIS — F411 Generalized anxiety disorder: Secondary | ICD-10-CM | POA: Insufficient documentation

## 2014-04-16 DIAGNOSIS — Z79899 Other long term (current) drug therapy: Secondary | ICD-10-CM | POA: Insufficient documentation

## 2014-04-16 DIAGNOSIS — G43909 Migraine, unspecified, not intractable, without status migrainosus: Secondary | ICD-10-CM | POA: Insufficient documentation

## 2014-04-16 DIAGNOSIS — F3289 Other specified depressive episodes: Secondary | ICD-10-CM | POA: Insufficient documentation

## 2014-04-16 DIAGNOSIS — F1410 Cocaine abuse, uncomplicated: Secondary | ICD-10-CM | POA: Insufficient documentation

## 2014-04-16 DIAGNOSIS — F1510 Other stimulant abuse, uncomplicated: Secondary | ICD-10-CM | POA: Insufficient documentation

## 2014-04-16 DIAGNOSIS — F909 Attention-deficit hyperactivity disorder, unspecified type: Secondary | ICD-10-CM | POA: Insufficient documentation

## 2014-04-16 LAB — URINALYSIS, ROUTINE W REFLEX MICROSCOPIC
Bilirubin Urine: NEGATIVE
GLUCOSE, UA: NEGATIVE mg/dL
KETONES UR: NEGATIVE mg/dL
Nitrite: NEGATIVE
PROTEIN: NEGATIVE mg/dL
Specific Gravity, Urine: 1.017 (ref 1.005–1.030)
UROBILINOGEN UA: 0.2 mg/dL (ref 0.0–1.0)
pH: 5.5 (ref 5.0–8.0)

## 2014-04-16 LAB — CBC WITH DIFFERENTIAL/PLATELET
BASOS ABS: 0.1 10*3/uL (ref 0.0–0.1)
Basophils Relative: 1 % (ref 0–1)
EOS ABS: 0.7 10*3/uL (ref 0.0–0.7)
EOS PCT: 7 % — AB (ref 0–5)
HCT: 41.8 % (ref 36.0–46.0)
Hemoglobin: 14.8 g/dL (ref 12.0–15.0)
LYMPHS ABS: 2.1 10*3/uL (ref 0.7–4.0)
LYMPHS PCT: 20 % (ref 12–46)
MCH: 30.1 pg (ref 26.0–34.0)
MCHC: 35.4 g/dL (ref 30.0–36.0)
MCV: 85.1 fL (ref 78.0–100.0)
Monocytes Absolute: 0.6 10*3/uL (ref 0.1–1.0)
Monocytes Relative: 6 % (ref 3–12)
NEUTROS PCT: 67 % (ref 43–77)
Neutro Abs: 6.9 10*3/uL (ref 1.7–7.7)
PLATELETS: 335 10*3/uL (ref 150–400)
RBC: 4.91 MIL/uL (ref 3.87–5.11)
RDW: 12.8 % (ref 11.5–15.5)
WBC: 10.4 10*3/uL (ref 4.0–10.5)

## 2014-04-16 LAB — URINE MICROSCOPIC-ADD ON

## 2014-04-16 LAB — COMPREHENSIVE METABOLIC PANEL
ALK PHOS: 78 U/L (ref 39–117)
ALT: 51 U/L — AB (ref 0–35)
AST: 40 U/L — ABNORMAL HIGH (ref 0–37)
Albumin: 3.9 g/dL (ref 3.5–5.2)
BUN: 14 mg/dL (ref 6–23)
CO2: 22 mEq/L (ref 19–32)
Calcium: 10.1 mg/dL (ref 8.4–10.5)
Chloride: 102 mEq/L (ref 96–112)
Creatinine, Ser: 1 mg/dL (ref 0.50–1.10)
GFR calc non Af Amer: 73 mL/min — ABNORMAL LOW (ref >90)
GFR, EST AFRICAN AMERICAN: 85 mL/min — AB (ref >90)
GLUCOSE: 155 mg/dL — AB (ref 70–99)
POTASSIUM: 3.7 meq/L (ref 3.7–5.3)
Sodium: 141 mEq/L (ref 137–147)
Total Bilirubin: 0.2 mg/dL — ABNORMAL LOW (ref 0.3–1.2)
Total Protein: 8.3 g/dL (ref 6.0–8.3)

## 2014-04-16 LAB — RAPID URINE DRUG SCREEN, HOSP PERFORMED
Amphetamines: POSITIVE — AB
BARBITURATES: POSITIVE — AB
Benzodiazepines: POSITIVE — AB
COCAINE: POSITIVE — AB
Opiates: POSITIVE — AB
TETRAHYDROCANNABINOL: NOT DETECTED

## 2014-04-16 LAB — HIV ANTIBODY (ROUTINE TESTING W REFLEX): HIV: NONREACTIVE

## 2014-04-16 LAB — WET PREP, GENITAL
Clue Cells Wet Prep HPF POC: NONE SEEN
Trich, Wet Prep: NONE SEEN

## 2014-04-16 LAB — PREGNANCY, URINE: Preg Test, Ur: NEGATIVE

## 2014-04-16 MED ORDER — CEPHALEXIN 500 MG PO CAPS: 500.0000 mg | ORAL_CAPSULE | Freq: Four times a day (QID) | ORAL | Status: DC

## 2014-04-16 MED ORDER — CLONIDINE HCL 0.1 MG PO TABS: 0.1000 mg | ORAL_TABLET | Freq: Three times a day (TID) | ORAL | Status: DC | PRN

## 2014-04-16 MED ORDER — CEPHALEXIN 250 MG PO CAPS
500.0000 mg | ORAL_CAPSULE | Freq: Once | ORAL | Status: AC
  Administered 2014-04-16: 500 mg via ORAL
  Filled 2014-04-16: qty 2

## 2014-04-16 MED ORDER — ALPRAZOLAM 0.5 MG PO TABS
1.0000 mg | ORAL_TABLET | Freq: Once | ORAL | Status: AC
  Administered 2014-04-16: 1 mg via ORAL
  Filled 2014-04-16: qty 2

## 2014-04-16 MED ORDER — SODIUM CHLORIDE 0.9 % IV BOLUS (SEPSIS): 1000.0000 mL | Freq: Once | INTRAVENOUS | Status: DC

## 2014-04-16 NOTE — Discharge Instructions (Signed)
Urinary Tract Infection Urinary tract infections (UTIs) can develop anywhere along your urinary tract. Your urinary tract is your body's drainage system for removing wastes and extra water. Your urinary tract includes two kidneys, two ureters, a bladder, and a urethra. Your kidneys are a pair of bean-shaped organs. Each kidney is about the size of your fist. They are located below your ribs, one on each side of your spine. CAUSES Infections are caused by microbes, which are microscopic organisms, including fungi, viruses, and bacteria. These organisms are so small that they can only be seen through a microscope. Bacteria are the microbes that most commonly cause UTIs. SYMPTOMS  Symptoms of UTIs may vary by age and gender of the patient and by the location of the infection. Symptoms in young women typically include a frequent and intense urge to urinate and a painful, burning feeling in the bladder or urethra during urination. Older women and men are more likely to be tired, shaky, and weak and have muscle aches and abdominal pain. A fever may mean the infection is in your kidneys. Other symptoms of a kidney infection include pain in your back or sides below the ribs, nausea, and vomiting. DIAGNOSIS To diagnose a UTI, your caregiver will ask you about your symptoms. Your caregiver also will ask to provide a urine sample. The urine sample will be tested for bacteria and white blood cells. White blood cells are made by your body to help fight infection. TREATMENT  Typically, UTIs can be treated with medication. Because most UTIs are caused by a bacterial infection, they usually can be treated with the use of antibiotics. The choice of antibiotic and length of treatment depend on your symptoms and the type of bacteria causing your infection. HOME CARE INSTRUCTIONS  If you were prescribed antibiotics, take them exactly as your caregiver instructs you. Finish the medication even if you feel better after you  have only taken some of the medication.  Drink enough water and fluids to keep your urine clear or pale yellow.  Avoid caffeine, tea, and carbonated beverages. They tend to irritate your bladder.  Empty your bladder often. Avoid holding urine for long periods of time.  Empty your bladder before and after sexual intercourse.  After a bowel movement, women should cleanse from front to back. Use each tissue only once. SEEK MEDICAL CARE IF:   You have back pain.  You develop a fever.  Your symptoms do not begin to resolve within 3 days. SEEK IMMEDIATE MEDICAL CARE IF:   You have severe back pain or lower abdominal pain.  You develop chills.  You have nausea or vomiting.  You have continued burning or discomfort with urination. MAKE SURE YOU:   Understand these instructions.  Will watch your condition.  Will get help right away if you are not doing well or get worse. Document Released: 09/02/2005 Document Revised: 05/24/2012 Document Reviewed: 01/01/2012 [facility] Patient Information 2014 [place], [place].   Take your next dose of antibiotic tomorrow morning.  Make sure you're drinking plenty of fluids.  Your urine has been sent for culture, you also have gonorrhea, Chlamydia cultures along with HIV testing pending as discussed.  You will be notified if a pair of these tests are positive.  Please followup with your doctor for any other persistent symptoms.  Get rechecked immediately if you develop uncontrolled vomiting, worse pain or increased fevers.

## 2014-04-16 NOTE — ED Provider Notes (Signed)
CSN: [id number]     Arrival date & time 04/16/14  1424 History   First MD Initiated Contact with Patient 04/16/14 1651     Chief Complaint  Patient presents with  . Joint Pain     (Consider location/radiation/quality/duration/timing/severity/associated sxs/prior Treatment) The history is provided by the patient.    [name] is a 33 y.o. female presenting with a two-day history of low-grade fever, MAXIMUM TEMPERATURE 99.9, redness and swelling in her bilateral toes which is now starting to affect her fingers today, mild sore throat along with low back pain.  Additionally, she describes vaginal odor without discharge which reminds her of her last infection with bacterial vaginosis.  She's had no nausea or vomiting, denies abdominal pain and has had no injuries or to explain her extremity pain and swelling.  She is in IV drug user, uses heroine which she injects into her left hand.  She denies injection into her feet.  She does has a history of hepatitis C which has not been treated.  She has recently, out of rehabilitation but has relapsed, stating she last injected heroin at 2 AM today.  She denies chest pain, palpitations and shortness of breath.  She is taking no medications today for her symptoms.  Her last dose of Xanax was at 10 AM this morning.  She also uses clonidine for withdrawal symptoms and her last dose of this was one hour before arrival.     Past Medical History  Diagnosis Date  . Anxiety   . Depression   . ADHD (attention deficit hyperactivity disorder)   . Polysubstance abuse     - Hx ETOH, cocaine, THC, IV heroin, narcotics  . Knee pain, bilateral     - patellofemoral syndrome, [facility]  . Migraines   . Borderline systolic HTN   . Psychiatric care     Previous [facility] H&P notes PMHx Schizophrenia, though patient denies this.  . Hypertension    History reviewed. No pertinent past surgical history. No family history on file. History   Substance Use Topics  . Smoking status: Current Every Day Smoker -- 0.30 packs/day    Types: Cigarettes  . Smokeless tobacco: Never Used  . Alcohol Use: No   OB History   Grav Para Term Preterm Abortions TAB SAB Ect Mult Living                 Review of Systems  Constitutional: Positive for fever and chills.  HENT: Positive for sore throat. Negative for congestion.   Eyes: Negative.   Respiratory: Negative for chest tightness and shortness of breath.   Cardiovascular: Negative for chest pain.  Gastrointestinal: Negative for nausea and abdominal pain.  Genitourinary: Negative.   Musculoskeletal: Positive for arthralgias, back pain and joint swelling. Negative for neck pain.  Skin: Positive for color change. Negative for rash and wound.  Neurological: Negative for dizziness, weakness, light-headedness, numbness and headaches.  Psychiatric/Behavioral: Negative.       Allergies  Review of patient's allergies indicates no known allergies.  Home Medications   Prior to Admission medications   Medication Sig Start Date End Date Taking? Authorizing Provider  ALPRAZolam Prudy Feeler(XANAX) 1 MG tablet Take 1 mg by mouth at bedtime as needed for anxiety.   Yes Historical Provider, MD  amphetamine-dextroamphetamine (ADDERALL XR) 20 MG 24 hr capsule Take 20 mg by mouth daily.   Yes Historical Provider, MD  cloNIDine (CATAPRES) 0.1 MG tablet Take 0.1 mg by mouth  2 (two) times daily.   Yes Historical Provider, MD  alum & mag hydroxide-simeth (MAALOX/MYLANTA) 200-200-20 MG/5ML suspension Take 15 mLs by mouth every 6 (six) hours as needed for indigestion.    Historical Provider, MD  Benzocaine 10 MG LOZG Use as directed 1 lozenge in the mouth or throat every 4 (four) hours as needed (for sore throat).    Historical Provider, MD  benzonatate (TESSALON) 100 MG capsule Take 100 mg by mouth 3 (three) times daily as needed for cough.    Historical Provider, MD  buPROPion (WELLBUTRIN XL) 150 MG 24 hr tablet  Take 150 mg by mouth daily.    Historical Provider, MD  cetirizine (ZYRTEC) 10 MG tablet Take 10 mg by mouth daily as needed for allergies.     Historical Provider, MD  clindamycin (CLEOCIN) 150 MG capsule Take 2 capsules (300 mg total) by mouth 3 (three) times daily. 11/23/13   Shari A Upstill, PA-C  Fructose-Dextrose-Phosphor Acd (NAUSEA CONTROL) 1.87-1.87-21.5 SOLN Take 5-10 mLs by mouth See admin instructions. Every 15 minutes until relief    Historical Provider, MD  guaiFENesin (MUCINEX) 600 MG 12 hr tablet Take 1,200 mg by mouth 2 (two) times daily.    Historical Provider, MD  hydrochlorothiazide (HYDRODIURIL) 25 MG tablet Take 25 mg by mouth daily.    Historical Provider, MD  ibuprofen (ADVIL,MOTRIN) 600 MG tablet Take 600 mg by mouth every 6 (six) hours as needed for pain.    Historical Provider, MD  loperamide (IMODIUM A-D) 2 MG tablet Take 2 mg by mouth 4 (four) times daily as needed for diarrhea or loose stools.    Historical Provider, MD  metoCLOPramide (REGLAN) 10 MG tablet Take 1 tablet (10 mg total) by mouth every 6 (six) hours as needed. 08/29/13   Lyanne Co, MD  metoprolol succinate (TOPROL-XL) 100 MG 24 hr tablet Take 100 mg by mouth daily. Take with or immediately following a meal.    Historical Provider, MD  ondansetron (ZOFRAN ODT) 8 MG disintegrating tablet Take 1 tablet (8 mg total) by mouth every 8 (eight) hours as needed for nausea. 07/26/13   Hilario Quarry, MD  OVER THE COUNTER MEDICATION Take 1 tablet by mouth daily. Multivitamin with folic acid    Historical Provider, MD  promethazine (PHENERGAN) 25 MG tablet Take 1 tablet (25 mg total) by mouth every 6 (six) hours as needed for nausea. 08/29/13   Lyanne Co, MD  traZODone (DESYREL) 50 MG tablet Take 50 mg by mouth at bedtime.    Historical Provider, MD   BP 155/84  Pulse 101  Temp(Src) 98.9 F (37.2 C) (Oral)  Resp 18  Ht 5\' 7"  (1.702 m)  Wt 190 lb (86.183 kg)  BMI 29.75 kg/m2  SpO2 100% Physical Exam   Nursing note and vitals reviewed. Constitutional: She appears well-developed and well-nourished.  HENT:  Head: Normocephalic and atraumatic.  Mouth/Throat: Uvula is midline. Mucous membranes are dry. No oropharyngeal exudate, posterior oropharyngeal edema or posterior oropharyngeal erythema.  Eyes: Conjunctivae are normal.  Neck: Normal range of motion.  Cardiovascular: Normal rate, regular rhythm, normal heart sounds and intact distal pulses.   No murmur heard. Pulmonary/Chest: Effort normal and breath sounds normal. She has no wheezes.  Abdominal: Soft. Bowel sounds are normal. There is no hepatosplenomegaly. There is no tenderness. There is no CVA tenderness.  Musculoskeletal: Normal range of motion.  Neurological: She is alert.  Skin: Skin is warm and dry. There is erythema.  There is  splotchy appearing erythema bilateral toes with mild bilateral edema.  Pedal pulses are normal.  There is no pitting edema.  No splinter hemorrhages.  Hand and fingers appear normal in appearance except for small tracking noted along left index finger and dorsal hand.  No red streaking.  Psychiatric: She has a normal mood and affect.    ED Course  Procedures (including critical care time) Labs Review Labs Reviewed  WET PREP, GENITAL - Abnormal; Notable for the following:    Yeast Wet Prep HPF POC RARE (*)    WBC, Wet Prep HPF POC RARE (*)    All other components within normal limits  URINALYSIS, ROUTINE W REFLEX MICROSCOPIC - Abnormal; Notable for the following:    APPearance CLOUDY (*)    Hgb urine dipstick TRACE (*)    Leukocytes, UA MODERATE (*)    All other components within normal limits  URINE MICROSCOPIC-ADD ON - Abnormal; Notable for the following:    Squamous Epithelial / LPF FEW (*)    Bacteria, UA MANY (*)    Casts HYALINE CASTS (*)    All other components within normal limits  CBC WITH DIFFERENTIAL - Abnormal; Notable for the following:    Eosinophils Relative 7 (*)    All other  components within normal limits  COMPREHENSIVE METABOLIC PANEL - Abnormal; Notable for the following:    Glucose, Bld 155 (*)    AST 40 (*)    ALT 51 (*)    Total Bilirubin 0.2 (*)    GFR calc non Af Amer 73 (*)    GFR calc Af Amer 85 (*)    All other components within normal limits  URINE RAPID DRUG SCREEN (HOSP PERFORMED) - Abnormal; Notable for the following:    Opiates POSITIVE (*)    Cocaine POSITIVE (*)    Benzodiazepines POSITIVE (*)    Amphetamines POSITIVE (*)    Barbiturates POSITIVE (*)    All other components within normal limits  GC/CHLAMYDIA PROBE AMP  URINE CULTURE  CULTURE, BLOOD (SINGLE)  PREGNANCY, URINE  RPR  HIV ANTIBODY (ROUTINE TESTING)    Imaging Review No results found.   EKG Interpretation None      MDM   Final diagnoses:  UTI (lower urinary tract infection)  Polysubstance abuse  Medication refill    Patients labs and/or radiological studies were viewed and considered during the medical decision making and disposition process.  At re-exam, erythema in toes resolved,  Patient also states pain in toes also resolved. Pt was seen by Dr. Loretha StaplerWofford during this visit.  She does have uti, urine culture was sent.  Other labs unremarkable.  She has not ttp along low back, no midline pain or swelling.  She was placed on keflex for the uti.  She will plan to see her pcp tomorrow as she is desirous of being prescribed methadone as she attempts to cut out her heroine.  She currently takes clonidine for prn use for withdrawal sx, only has a few tablets left, refill given. [place], but is not interested in placement at this time. She was stable at time of dc.  She is afebrile here, anxious without tremor, no confusion, nausea or vomiting.      Burgess AmorJulie Brenten Janney, PA-C 04/17/14 [phone]

## 2014-04-16 NOTE — ED Notes (Signed)
Pt standing at door wanted to be discharged, informed patient and family member that I would notify PA providing care, PA notifed and working on d/c papers

## 2014-04-16 NOTE — ED Notes (Signed)
MD at bedside. 

## 2014-04-16 NOTE — ED Notes (Signed)
Pt reports joint swelling, low back pain, sore throat, fever x 2 days.  States 'i looked it up and am sure i have osteomyelitis b/c is currently iv drug user.'

## 2014-04-17 LAB — GC/CHLAMYDIA PROBE AMP
CT Probe RNA: NEGATIVE
GC PROBE AMP APTIMA: NEGATIVE

## 2014-04-17 LAB — RPR

## 2014-04-18 LAB — URINE CULTURE
Colony Count: >=100000
Special Requests: NORMAL

## 2014-04-19 ENCOUNTER — Telehealth (HOSPITAL_BASED_OUTPATIENT_CLINIC_OR_DEPARTMENT_OTHER): Payer: Self-pay | Admitting: Emergency Medicine

## 2014-04-19 NOTE — ED Provider Notes (Signed)
Medical screening examination/treatment/procedure(s) were conducted as a shared visit with non-physician practitioner(s) and myself.  I personally evaluated the patient during the encounter.   EKG Interpretation None      33 yo female presenting with toe pain and swelling.  At time of my interview, both of these symptoms had completely resolved.  On review of systems, she endorsed some dull low back pain.  I think this is secondary to her UTI.  She is an IV drug abuser, but I have low suspicion for discitis or other dangerous lumbar pathology.  No bowel or bladder incontinence, no lower extremity weakness or loss of sensation, no saddle anesthesia.  On exam, anxious appearing, nontoxic, no heart murmur, no swelling or rash on her feet or toes, no splinter hemorrhages, no back TTP, normal gait, 2+ patellar reflexes.  Advised close monitoring of symptoms and gave return precautions.  Clinical Impression: 1. UTI (lower urinary tract infection)   2. Polysubstance abuse   3. Medication refill       Candyce ChurnJohn David Raja Caputi III, MD 04/19/14 1455

## 2014-04-19 NOTE — Telephone Encounter (Signed)
Post ED Visit - Positive Culture Follow-up  Culture report reviewed by antimicrobial stewardship pharmacist: []  [name], Pharm.D., BCPS [x]  [name], Pharm.D., BCPS []  [name], [address] BCPS []  [name], [address] BCPS, AAHIVP []  [name], Pharm.D., BCPS, AAHIVP []  [name], Pharm.D.  Positive urine culture Treated with Keflex, organism sensitive to the same and no further patient follow-up is required at this time.  [name] 04/19/2014, 11:42 AM

## 2014-04-19 NOTE — ED Provider Notes (Signed)
Medical screening examination/treatment/procedure(s) were conducted as a shared visit with non-physician practitioner(s) and myself.  I personally evaluated the patient during the encounter.   Please see my separate note.     Candyce ChurnJohn David Ayano Douthitt III, MD 04/19/14 [phone]

## 2014-04-23 LAB — CULTURE, BLOOD (SINGLE): CULTURE: NO GROWTH

## 2014-11-09 ENCOUNTER — Encounter (HOSPITAL_BASED_OUTPATIENT_CLINIC_OR_DEPARTMENT_OTHER): Payer: Self-pay

## 2014-11-09 ENCOUNTER — Emergency Department (HOSPITAL_BASED_OUTPATIENT_CLINIC_OR_DEPARTMENT_OTHER): Payer: BC Managed Care – PPO

## 2014-11-09 ENCOUNTER — Emergency Department (HOSPITAL_BASED_OUTPATIENT_CLINIC_OR_DEPARTMENT_OTHER)
Admission: EM | Admit: 2014-11-09 | Discharge: 2014-11-09 | Disposition: A | Discharge status: Home / Self Care | Payer: BC Managed Care – PPO | Attending: Emergency Medicine | Admitting: Emergency Medicine

## 2014-11-09 DIAGNOSIS — G43909 Migraine, unspecified, not intractable, without status migrainosus: Secondary | ICD-10-CM | POA: Diagnosis not present

## 2014-11-09 DIAGNOSIS — F419 Anxiety disorder, unspecified: Secondary | ICD-10-CM | POA: Insufficient documentation

## 2014-11-09 DIAGNOSIS — F329 Major depressive disorder, single episode, unspecified: Secondary | ICD-10-CM | POA: Insufficient documentation

## 2014-11-09 DIAGNOSIS — Z720 Tobacco use: Secondary | ICD-10-CM | POA: Diagnosis not present

## 2014-11-09 DIAGNOSIS — R109 Unspecified abdominal pain: Secondary | ICD-10-CM | POA: Diagnosis present

## 2014-11-09 DIAGNOSIS — N200 Calculus of kidney: Secondary | ICD-10-CM

## 2014-11-09 DIAGNOSIS — F909 Attention-deficit hyperactivity disorder, unspecified type: Secondary | ICD-10-CM | POA: Diagnosis not present

## 2014-11-09 DIAGNOSIS — Z792 Long term (current) use of antibiotics: Secondary | ICD-10-CM | POA: Insufficient documentation

## 2014-11-09 DIAGNOSIS — Z79899 Other long term (current) drug therapy: Secondary | ICD-10-CM | POA: Diagnosis not present

## 2014-11-09 DIAGNOSIS — I10 Essential (primary) hypertension: Secondary | ICD-10-CM | POA: Diagnosis not present

## 2014-11-09 LAB — CBC WITH DIFFERENTIAL/PLATELET
BASOS PCT: 1 % (ref 0–1)
Basophils Absolute: 0.1 10*3/uL (ref 0.0–0.1)
Eosinophils Absolute: 0.4 10*3/uL (ref 0.0–0.7)
Eosinophils Relative: 4 % (ref 0–5)
HCT: 37.9 % (ref 36.0–46.0)
HEMOGLOBIN: 12.8 g/dL (ref 12.0–15.0)
LYMPHS PCT: 30 % (ref 12–46)
Lymphs Abs: 2.8 10*3/uL (ref 0.7–4.0)
MCH: 29.5 pg (ref 26.0–34.0)
MCHC: 33.8 g/dL (ref 30.0–36.0)
MCV: 87.3 fL (ref 78.0–100.0)
Monocytes Absolute: 0.6 10*3/uL (ref 0.1–1.0)
Monocytes Relative: 6 % (ref 3–12)
NEUTROS PCT: 59 % (ref 43–77)
Neutro Abs: 5.5 10*3/uL (ref 1.7–7.7)
Platelets: 308 10*3/uL (ref 150–400)
RBC: 4.34 MIL/uL (ref 3.87–5.11)
RDW: 12.7 % (ref 11.5–15.5)
WBC: 9.4 10*3/uL (ref 4.0–10.5)

## 2014-11-09 LAB — BASIC METABOLIC PANEL
Anion gap: 13 (ref 5–15)
BUN: 15 mg/dL (ref 6–23)
CO2: 26 meq/L (ref 19–32)
CREATININE: 0.9 mg/dL (ref 0.50–1.10)
Calcium: 9.1 mg/dL (ref 8.4–10.5)
Chloride: 104 mEq/L (ref 96–112)
GFR calc non Af Amer: 83 mL/min — ABNORMAL LOW (ref >90)
Glucose, Bld: 143 mg/dL — ABNORMAL HIGH (ref 70–99)
POTASSIUM: 3.6 meq/L — AB (ref 3.7–5.3)
SODIUM: 143 meq/L (ref 137–147)

## 2014-11-09 LAB — URINALYSIS, ROUTINE W REFLEX MICROSCOPIC
Bilirubin Urine: NEGATIVE
GLUCOSE, UA: NEGATIVE mg/dL
Ketones, ur: NEGATIVE mg/dL
Nitrite: NEGATIVE
PROTEIN: 30 mg/dL — AB
SPECIFIC GRAVITY, URINE: 1.019 (ref 1.005–1.030)
Urobilinogen, UA: 0.2 mg/dL (ref 0.0–1.0)
pH: 5.5 (ref 5.0–8.0)

## 2014-11-09 LAB — URINE MICROSCOPIC-ADD ON

## 2014-11-09 LAB — PREGNANCY, URINE: Preg Test, Ur: NEGATIVE

## 2014-11-09 MED ORDER — SODIUM CHLORIDE 0.9 % IV BOLUS (SEPSIS)
1000.0000 mL | Freq: Once | INTRAVENOUS | Status: AC
Start: 2014-11-09 — End: 2014-11-09
  Administered 2014-11-09: 1000 mL via INTRAVENOUS

## 2014-11-09 MED ORDER — KETOROLAC TROMETHAMINE 30 MG/ML IJ SOLN
30.0000 mg | Freq: Once | INTRAMUSCULAR | Status: AC
  Administered 2014-11-09: 30 mg via INTRAVENOUS

## 2014-11-09 MED ORDER — CIPROFLOXACIN HCL 500 MG PO TABS: 500.0000 mg | ORAL_TABLET | Freq: Two times a day (BID) | ORAL | Status: DC

## 2014-11-09 MED ORDER — TAMSULOSIN HCL 0.4 MG PO CAPS: 0.4000 mg | ORAL_CAPSULE | Freq: Every day | ORAL | Status: DC

## 2014-11-09 NOTE — ED Notes (Signed)
MD made aware that pt is still in pain, MD to go see patient.

## 2014-11-09 NOTE — ED Provider Notes (Signed)
CSN: [id number]     Arrival date & time 11/09/14  0725 History   First MD Initiated Contact with Patient 11/09/14 [phone]     Chief Complaint  Patient presents with  . Flank Pain     (Consider location/radiation/quality/duration/timing/severity/associated sxs/prior Treatment) HPI Comments: Pt presents with left flank pain.  Pain started suddenly this am.  Is waxing and waning.  Starts in left back and radiates to left mid abdomen.  Has nausea, but no vomiting.  No fevers.  No urinary symptoms.  No vaginal bleeding or discharge.  No hx of kidney stones, but dad has hx of multiple kidney stones.  Of note, pt has hx of IV drug abuse, prior methadone use.  Patient is a 33 y.o. female presenting with flank pain.  Flank Pain Associated symptoms include abdominal pain. Pertinent negatives include no chest pain, no headaches and no shortness of breath.    Past Medical History  Diagnosis Date  . Anxiety   . Depression   . ADHD (attention deficit hyperactivity disorder)   . Polysubstance abuse     - Hx ETOH, cocaine, THC, IV heroin, narcotics  . Knee pain, bilateral     - patellofemoral syndrome, [facility]  . Migraines   . Borderline systolic HTN   . Psychiatric care     Previous [facility] H&P notes PMHx Schizophrenia, though patient denies this.  . Hypertension    History reviewed. No pertinent past surgical history. No family history on file. History  Substance Use Topics  . Smoking status: Current Every Day Smoker -- 0.30 packs/day    Types: Cigarettes  . Smokeless tobacco: Never Used  . Alcohol Use: No   OB History    No data available     Review of Systems  Constitutional: Negative for fever, chills, diaphoresis and fatigue.  HENT: Negative for congestion, rhinorrhea and sneezing.   Eyes: Negative.   Respiratory: Negative for cough, chest tightness and shortness of breath.   Cardiovascular: Negative for chest pain and leg swelling.  Gastrointestinal: Positive  for nausea, abdominal pain and diarrhea (last night). Negative for vomiting and blood in stool.  Genitourinary: Positive for flank pain. Negative for frequency, hematuria and difficulty urinating.  Musculoskeletal: Positive for back pain. Negative for arthralgias.  Skin: Negative for rash.  Neurological: Negative for dizziness, speech difficulty, weakness, numbness and headaches.      Allergies  Review of patient's allergies indicates no known allergies.  Home Medications   Prior to Admission medications   Medication Sig Start Date End Date Taking? Authorizing Provider  ALPRAZolam Prudy Feeler(XANAX) 1 MG tablet Take 1 mg by mouth at bedtime as needed for anxiety.    Historical Provider, MD  alum & mag hydroxide-simeth (MAALOX/MYLANTA) 200-200-20 MG/5ML suspension Take 15 mLs by mouth every 6 (six) hours as needed for indigestion.    Historical Provider, MD  amphetamine-dextroamphetamine (ADDERALL XR) 20 MG 24 hr capsule Take 20 mg by mouth daily.    Historical Provider, MD  Benzocaine 10 MG LOZG Use as directed 1 lozenge in the mouth or throat every 4 (four) hours as needed (for sore throat).    Historical Provider, MD  benzonatate (TESSALON) 100 MG capsule Take 100 mg by mouth 3 (three) times daily as needed for cough.    Historical Provider, MD  buPROPion (WELLBUTRIN XL) 150 MG 24 hr tablet Take 150 mg by mouth daily.    Historical Provider, MD  cephALEXin (KEFLEX) 500 MG capsule Take 1 capsule (  500 mg total) by mouth 4 (four) times daily. 04/16/14   Burgess Amor, PA-C  cetirizine (ZYRTEC) 10 MG tablet Take 10 mg by mouth daily as needed for allergies.     Historical Provider, MD  ciprofloxacin (CIPRO) 500 MG tablet Take 1 tablet (500 mg total) by mouth 2 (two) times daily. One po bid x 7 days 11/09/14   Rolan Bucco, MD  clindamycin (CLEOCIN) 150 MG capsule Take 2 capsules (300 mg total) by mouth 3 (three) times daily. 11/23/13   Shari A Upstill, PA-C  cloNIDine (CATAPRES) 0.1 MG tablet Take 0.1 mg by  mouth 2 (two) times daily.    Historical Provider, MD  cloNIDine (CATAPRES) 0.1 MG tablet Take 1 tablet (0.1 mg total) by mouth 3 (three) times daily as needed. 04/16/14   Burgess Amor, PA-C  Fructose-Dextrose-Phosphor Acd (NAUSEA CONTROL) 1.87-1.87-21.5 SOLN Take 5-10 mLs by mouth See admin instructions. Every 15 minutes until relief    Historical Provider, MD  guaiFENesin (MUCINEX) 600 MG 12 hr tablet Take 1,200 mg by mouth 2 (two) times daily.    Historical Provider, MD  hydrochlorothiazide (HYDRODIURIL) 25 MG tablet Take 25 mg by mouth daily.    Historical Provider, MD  ibuprofen (ADVIL,MOTRIN) 600 MG tablet Take 600 mg by mouth every 6 (six) hours as needed for pain.    Historical Provider, MD  loperamide (IMODIUM A-D) 2 MG tablet Take 2 mg by mouth 4 (four) times daily as needed for diarrhea or loose stools.    Historical Provider, MD  metoCLOPramide (REGLAN) 10 MG tablet Take 1 tablet (10 mg total) by mouth every 6 (six) hours as needed. 08/29/13   Lyanne Co, MD  metoprolol succinate (TOPROL-XL) 100 MG 24 hr tablet Take 100 mg by mouth daily. Take with or immediately following a meal.    Historical Provider, MD  ondansetron (ZOFRAN ODT) 8 MG disintegrating tablet Take 1 tablet (8 mg total) by mouth every 8 (eight) hours as needed for nausea. 07/26/13   Hilario Quarry, MD  OVER THE COUNTER MEDICATION Take 1 tablet by mouth daily. Multivitamin with folic acid    Historical Provider, MD  promethazine (PHENERGAN) 25 MG tablet Take 1 tablet (25 mg total) by mouth every 6 (six) hours as needed for nausea. 08/29/13   Lyanne Co, MD  tamsulosin (FLOMAX) 0.4 MG CAPS capsule Take 1 capsule (0.4 mg total) by mouth daily. 11/09/14   Rolan Bucco, MD  traZODone (DESYREL) 50 MG tablet Take 50 mg by mouth at bedtime.    Historical Provider, MD   BP 157/98 mmHg  Pulse 92  Temp(Src) 97.7 F (36.5 C) (Oral)  Resp 16  Ht 5\' 7"  (1.702 m)  Wt 170 lb (77.111 kg)  BMI 26.62 kg/m2  SpO2 98%  LMP  11/04/2014 Physical Exam  Constitutional: She is oriented to person, place, and time. She appears well-developed and well-nourished.  HENT:  Head: Normocephalic and atraumatic.  Eyes: Pupils are equal, round, and reactive to light.  Neck: Normal range of motion. Neck supple.  Cardiovascular: Normal rate, regular rhythm and normal heart sounds.   Pulmonary/Chest: Effort normal and breath sounds normal. No respiratory distress. She has no wheezes. She has no rales. She exhibits no tenderness.  Abdominal: Soft. Bowel sounds are normal. There is tenderness (mild TTP left flank). There is no rebound and no guarding.  Musculoskeletal: Normal range of motion. She exhibits no edema.  Lymphadenopathy:    She has no cervical adenopathy.  Neurological: She  is alert and oriented to person, place, and time.  Skin: Skin is warm and dry. No rash noted.  Psychiatric: She has a normal mood and affect.    ED Course  Procedures (including critical care time) Labs Review Labs Reviewed  BASIC METABOLIC PANEL - Abnormal; Notable for the following:    Potassium 3.6 (*)    Glucose, Bld 143 (*)    GFR calc non Af Amer 83 (*)    All other components within normal limits  URINALYSIS, ROUTINE W REFLEX MICROSCOPIC - Abnormal; Notable for the following:    Color, Urine RED (*)    APPearance CLOUDY (*)    Hgb urine dipstick LARGE (*)    Protein, ur 30 (*)    Leukocytes, UA SMALL (*)    All other components within normal limits  URINE MICROSCOPIC-ADD ON - Abnormal; Notable for the following:    Squamous Epithelial / LPF FEW (*)    Bacteria, UA MANY (*)    All other components within normal limits  CBC WITH DIFFERENTIAL  PREGNANCY, URINE    Imaging Review Ct Renal Stone Study  11/09/2014   CLINICAL DATA:  One day history of left flank pain  EXAM: CT ABDOMEN AND PELVIS WITHOUT CONTRAST  TECHNIQUE: Multidetector CT imaging of the abdomen and pelvis was performed following the standard protocol without oral  or intravenous contrast material administration.  COMPARISON:  August 27, 2013  FINDINGS: Lung bases are clear.  No focal liver lesions are identified on this noncontrast enhanced study. The gallbladder wall is not thickened. Gallbladder is somewhat contracted. There is no appreciable biliary duct dilatation.  Spleen, pancreas, and adrenals appear normal.  There is no renal mass on either side. There is no intrarenal calculus or hydronephrosis on the right. There is no ureteral calculus on the right. On the left, there is mild hydronephrosis. There is no intrarenal calculus on the left. There is a calculus at the left ureterovesical junction measuring 2 mm in size causing obstruction on the left. No other ureteral calculi are appreciated.  In the pelvis, the urinary bladder is midline with normal wall thickness. There is a dominant follicle in the left ovary measuring 1.9 x 1.3 cm. Beyond this follicle, there is no pelvic mass. There is no pelvic fluid collection. Appendix appears normal.  There is no bowel obstruction. No free air or portal venous air. There is no appreciable ascites, adenopathy, or abscess in the abdomen or pelvis. There is a rather minimal ventral hernia containing only fat. There is no demonstrable abdominal aortic aneurysm. There are no blastic or lytic bone lesions.  IMPRESSION: 2 mm calculus left ureterovesical junction causing mild hydronephrosis and ureterectasis on the left. No other ureteral calculi. No intrarenal mass or calculus on either side. No hydronephrosis on the right.  Appendix appears normal. No bowel obstruction. No abscess. There is a rather minimal ventral hernia containing only fat.   Electronically Signed   By: Bretta BangWilliam  Woodruff M.D.   On: 11/09/2014 08:44     EKG Interpretation None      MDM   Final diagnoses:  Kidney stone    Patient is given Toradol in the ED. She did not get relief in pain. I had a discussion with her regarding pain management. She  recently came off of heroin and methadone. Her dad is also in the room. I am reluctant to give her ongoing narcotic medicines. I didn't obtain the option of getting her a dose of medicine the  ED or trying another dose of Toradol. She became quite upset and said that she was ready to go.  I tried to calm her down and state that we could just have a conversation about how we would manage her pain but she said that she was fine and that she is ready to leave. I explained that we will prescribe Flomax and that she could use Motrin and Tylenol at home. I gave her referral to [facility] and gave her return precautions.    Rolan Bucco, MD 11/09/14 [phone]

## 2014-11-09 NOTE — ED Notes (Signed)
Pt sts she is unable to provide a urine specimen.

## 2014-11-09 NOTE — ED Notes (Signed)
Reports awakening with left sided flank pain. Reports some diarrhea yesterday. Some nausea, no vomiting.

## 2014-11-09 NOTE — Discharge Instructions (Signed)
Return to the emergency room if you have worsening pain, vomiting fevers or difficulty urinating   Kidney Stones Kidney stones (urolithiasis) are deposits that form inside your kidneys. The intense pain is caused by the stone moving through the urinary tract. When the stone moves, the ureter goes into spasm around the stone. The stone is usually passed in the urine.  CAUSES   A disorder that makes certain neck glands produce too much parathyroid hormone (primary hyperparathyroidism).  A buildup of uric acid crystals, similar to gout in your joints.  Narrowing (stricture) of the ureter.  A kidney obstruction present at birth (congenital obstruction).  Previous surgery on the kidney or ureters.  Numerous kidney infections. SYMPTOMS   Feeling sick to your stomach (nauseous).  Throwing up (vomiting).  Blood in the urine (hematuria).  Pain that usually spreads (radiates) to the groin.  Frequency or urgency of urination. DIAGNOSIS   Taking a history and physical exam.  Blood or urine tests.  CT scan.  Occasionally, an examination of the inside of the urinary bladder (cystoscopy) is performed. TREATMENT   Observation.  Increasing your fluid intake.  Extracorporeal shock wave lithotripsy--This is a noninvasive procedure that uses shock waves to break up kidney stones.  Surgery may be needed if you have severe pain or persistent obstruction. There are various surgical procedures. Most of the procedures are performed with the use of small instruments. Only small incisions are needed to accommodate these instruments, so recovery time is minimized. The size, location, and chemical composition are all important variables that will determine the proper choice of action for you. Talk to your health care provider to better understand your situation so that you will minimize the risk of injury to yourself and your kidney.  HOME CARE INSTRUCTIONS   Drink enough water and fluids to  keep your urine clear or pale yellow. This will help you to pass the stone or stone fragments.  Strain all urine through the provided strainer. Keep all particulate matter and stones for your health care provider to see. The stone causing the pain may be as small as a grain of salt. It is very important to use the strainer each and every time you pass your urine. The collection of your stone will allow your health care provider to analyze it and verify that a stone has actually passed. The stone analysis will often identify what you can do to reduce the incidence of recurrences.  Only take over-the-counter or prescription medicines for pain, discomfort, or fever as directed by your health care provider.  Make a follow-up appointment with your health care provider as directed.  Get follow-up X-rays if required. The absence of pain does not always mean that the stone has passed. It may have only stopped moving. If the urine remains completely obstructed, it can cause loss of kidney function or even complete destruction of the kidney. It is your responsibility to make sure X-rays and follow-ups are completed. Ultrasounds of the kidney can show blockages and the status of the kidney. Ultrasounds are not associated with any radiation and can be performed easily in a matter of minutes. SEEK MEDICAL CARE IF:  You experience pain that is progressive and unresponsive to any pain medicine you have been prescribed. SEEK IMMEDIATE MEDICAL CARE IF:   Pain cannot be controlled with the prescribed medicine.  You have a fever or shaking chills.  The severity or intensity of pain increases over 18 hours and is not relieved by pain  medicine.  You develop a new onset of abdominal pain.  You feel faint or pass out.  You are unable to urinate. MAKE SURE YOU:   Understand these instructions.  Will watch your condition.  Will get help right away if you are not doing well or get worse. Document Released:  11/23/2005 Document Revised: 07/26/2013 Document Reviewed: 04/26/2013 [facility] Patient Information 2015 [employer] [place]. This information is not intended to replace advice given to you by your health care provider. Make sure you discuss any questions you have with your health care provider.

## 2014-11-09 NOTE — ED Notes (Signed)
MD at bedside. 

## 2015-11-29 ENCOUNTER — Encounter (HOSPITAL_BASED_OUTPATIENT_CLINIC_OR_DEPARTMENT_OTHER): Payer: Self-pay

## 2015-11-29 ENCOUNTER — Emergency Department (HOSPITAL_BASED_OUTPATIENT_CLINIC_OR_DEPARTMENT_OTHER)
Admission: EM | Admit: 2015-11-29 | Discharge: 2015-11-29 | Disposition: A | Discharge status: Home / Self Care | Payer: Medicare Other | Attending: Emergency Medicine | Admitting: Emergency Medicine

## 2015-11-29 DIAGNOSIS — Z792 Long term (current) use of antibiotics: Secondary | ICD-10-CM | POA: Diagnosis not present

## 2015-11-29 DIAGNOSIS — F329 Major depressive disorder, single episode, unspecified: Secondary | ICD-10-CM | POA: Insufficient documentation

## 2015-11-29 DIAGNOSIS — F419 Anxiety disorder, unspecified: Secondary | ICD-10-CM | POA: Diagnosis not present

## 2015-11-29 DIAGNOSIS — N390 Urinary tract infection, site not specified: Secondary | ICD-10-CM | POA: Diagnosis not present

## 2015-11-29 DIAGNOSIS — I10 Essential (primary) hypertension: Secondary | ICD-10-CM | POA: Diagnosis not present

## 2015-11-29 DIAGNOSIS — G43909 Migraine, unspecified, not intractable, without status migrainosus: Secondary | ICD-10-CM | POA: Insufficient documentation

## 2015-11-29 DIAGNOSIS — F17210 Nicotine dependence, cigarettes, uncomplicated: Secondary | ICD-10-CM | POA: Insufficient documentation

## 2015-11-29 DIAGNOSIS — Z3202 Encounter for pregnancy test, result negative: Secondary | ICD-10-CM | POA: Insufficient documentation

## 2015-11-29 DIAGNOSIS — Z79899 Other long term (current) drug therapy: Secondary | ICD-10-CM | POA: Diagnosis not present

## 2015-11-29 DIAGNOSIS — F909 Attention-deficit hyperactivity disorder, unspecified type: Secondary | ICD-10-CM | POA: Diagnosis not present

## 2015-11-29 DIAGNOSIS — R300 Dysuria: Secondary | ICD-10-CM | POA: Diagnosis present

## 2015-11-29 LAB — URINE MICROSCOPIC-ADD ON

## 2015-11-29 LAB — URINALYSIS, ROUTINE W REFLEX MICROSCOPIC
BILIRUBIN URINE: NEGATIVE
Glucose, UA: NEGATIVE mg/dL
KETONES UR: NEGATIVE mg/dL
NITRITE: POSITIVE — AB
Protein, ur: 100 mg/dL — AB
Specific Gravity, Urine: 1.01 (ref 1.005–1.030)
pH: 7.5 (ref 5.0–8.0)

## 2015-11-29 LAB — PREGNANCY, URINE: PREG TEST UR: NEGATIVE

## 2015-11-29 MED ORDER — SULFAMETHOXAZOLE-TRIMETHOPRIM 800-160 MG PO TABS
1.0000 | ORAL_TABLET | Freq: Once | ORAL | Status: AC
  Administered 2015-11-29: 1 via ORAL
  Filled 2015-11-29: qty 1

## 2015-11-29 MED ORDER — PHENAZOPYRIDINE HCL 200 MG PO TABS: 200.0000 mg | ORAL_TABLET | Freq: Three times a day (TID) | ORAL | Status: DC

## 2015-11-29 MED ORDER — PHENAZOPYRIDINE HCL 100 MG PO TABS
200.0000 mg | ORAL_TABLET | Freq: Once | ORAL | Status: AC
  Administered 2015-11-29: 200 mg via ORAL
  Filled 2015-11-29: qty 2

## 2015-11-29 MED ORDER — SULFAMETHOXAZOLE-TRIMETHOPRIM 800-160 MG PO TABS: 1.0000 | ORAL_TABLET | Freq: Two times a day (BID) | ORAL | Status: AC

## 2015-11-29 NOTE — ED Notes (Signed)
Pt Pt c/o painful, frequent urination since Monday with increased lower abdominal pain and now right flank pain, denies fever, has been taking AZO over the counter to help with symptoms.

## 2015-11-29 NOTE — ED Provider Notes (Signed)
CSN: [id number]     Arrival date & time 11/29/15  0129 History   None    Chief Complaint  Patient presents with  . Dysuria     (Consider location/radiation/quality/duration/timing/severity/associated sxs/prior Treatment) HPI  This is a 34 year old female with a three-day history of dysuria. She is having urinary urgency, urinary frequency and burning with urination. Symptoms are moderate. She is also having some mild right-sided back pain. She denies fever, chills, nausea, vomiting or diarrhea. She has been taking over-the-counter Ace without adequate relief.  Past Medical History  Diagnosis Date  . Anxiety   . Depression   . ADHD (attention deficit hyperactivity disorder)   . Polysubstance abuse     - Hx ETOH, cocaine, THC, IV heroin, narcotics  . Knee pain, bilateral     - patellofemoral syndrome, followed by Dr. [name] at [facility]  . Migraines   . Borderline systolic HTN   . Psychiatric care     Previous [facility] H&P notes PMHx Schizophrenia, though patient denies this.  . Hypertension    History reviewed. No pertinent past surgical history. No family history on file. Social History  Substance Use Topics  . Smoking status: Current Every Day Smoker -- 0.30 packs/day    Types: Cigarettes  . Smokeless tobacco: Never Used  . Alcohol Use: No   OB History    No data available     Review of Systems  All other systems reviewed and are negative.   Allergies  Review of patient's allergies indicates no known allergies.  Home Medications   Prior to Admission medications   Medication Sig Start Date End Date Taking? Authorizing Provider  ALPRAZolam Prudy Feeler) 1 MG tablet Take 1 mg by mouth at bedtime as needed for anxiety.   Yes Historical Provider, MD  amphetamine-dextroamphetamine (ADDERALL XR) 20 MG 24 hr capsule Take 20 mg by mouth daily.   Yes Historical Provider, MD  CarBAMazepine (TEGRETOL PO) Take 50 mg by mouth at bedtime.   Yes Historical Provider, MD  alum & mag  hydroxide-simeth (MAALOX/MYLANTA) 200-200-20 MG/5ML suspension Take 15 mLs by mouth every 6 (six) hours as needed for indigestion.    Historical Provider, MD  Benzocaine 10 MG LOZG Use as directed 1 lozenge in the mouth or throat every 4 (four) hours as needed (for sore throat).    Historical Provider, MD  benzonatate (TESSALON) 100 MG capsule Take 100 mg by mouth 3 (three) times daily as needed for cough.    Historical Provider, MD  buPROPion (WELLBUTRIN XL) 150 MG 24 hr tablet Take 150 mg by mouth daily.    Historical Provider, MD  cephALEXin (KEFLEX) 500 MG capsule Take 1 capsule (500 mg total) by mouth 4 (four) times daily. 04/16/14   Burgess Amor, PA-C  cetirizine (ZYRTEC) 10 MG tablet Take 10 mg by mouth daily as needed for allergies.     Historical Provider, MD  ciprofloxacin (CIPRO) 500 MG tablet Take 1 tablet (500 mg total) by mouth 2 (two) times daily. One po bid x 7 days 11/09/14   Rolan Bucco, MD  clindamycin (CLEOCIN) 150 MG capsule Take 2 capsules (300 mg total) by mouth 3 (three) times daily. 11/23/13   Elpidio Anis, PA-C  cloNIDine (CATAPRES) 0.1 MG tablet Take 0.1 mg by mouth 2 (two) times daily.    Historical Provider, MD  cloNIDine (CATAPRES) 0.1 MG tablet Take 1 tablet (0.1 mg total) by mouth 3 (three) times daily as needed. 04/16/14   Burgess Amor, PA-C  Fructose-Dextrose-Phosphor Acd (  NAUSEA CONTROL) 1.87-1.87-21.5 SOLN Take 5-10 mLs by mouth See admin instructions. Every 15 minutes until relief    Historical Provider, MD  guaiFENesin (MUCINEX) 600 MG 12 hr tablet Take 1,200 mg by mouth 2 (two) times daily.    Historical Provider, MD  hydrochlorothiazide (HYDRODIURIL) 25 MG tablet Take 25 mg by mouth daily.    Historical Provider, MD  ibuprofen (ADVIL,MOTRIN) 600 MG tablet Take 600 mg by mouth every 6 (six) hours as needed for pain.    Historical Provider, MD  loperamide (IMODIUM A-D) 2 MG tablet Take 2 mg by mouth 4 (four) times daily as needed for diarrhea or loose stools.     Historical Provider, MD  metoCLOPramide (REGLAN) 10 MG tablet Take 1 tablet (10 mg total) by mouth every 6 (six) hours as needed. 08/29/13   Azalia BilisKevin Campos, MD  metoprolol succinate (TOPROL-XL) 100 MG 24 hr tablet Take 100 mg by mouth daily. Take with or immediately following a meal.    Historical Provider, MD  ondansetron (ZOFRAN ODT) 8 MG disintegrating tablet Take 1 tablet (8 mg total) by mouth every 8 (eight) hours as needed for nausea. 07/26/13   Margarita Grizzleanielle Ray, MD  OVER THE COUNTER MEDICATION Take 1 tablet by mouth daily. Multivitamin with folic acid    Historical Provider, MD  promethazine (PHENERGAN) 25 MG tablet Take 1 tablet (25 mg total) by mouth every 6 (six) hours as needed for nausea. 08/29/13   Azalia BilisKevin Campos, MD  tamsulosin (FLOMAX) 0.4 MG CAPS capsule Take 1 capsule (0.4 mg total) by mouth daily. 11/09/14   Rolan BuccoMelanie Belfi, MD  traZODone (DESYREL) 50 MG tablet Take 50 mg by mouth at bedtime.    Historical Provider, MD   BP 132/84 mmHg  Pulse 93  Temp(Src) 98.6 F (37 C) (Oral)  Resp 18  Ht 5\' 7"  (1.702 m)  Wt 180 lb (81.647 kg)  BMI 28.19 kg/m2  SpO2 100%  LMP 11/09/2015   Physical Exam  General: Well-developed, well-nourished female in no acute distress; appearance consistent with age of record HENT: normocephalic; atraumatic Eyes: pupils equal, round and reactive to light; extraocular muscles intact Neck: supple Heart: regular rate and rhythm Lungs: clear to auscultation bilaterally Abdomen: soft; nondistended; mild suprapubic tenderness; no masses or hepatosplenomegaly; bowel sounds present GU: No CVA tenderness Extremities: No deformity; full range of motion; pulses normal Neurologic: Awake, alert and oriented; motor function intact in all extremities and symmetric; no facial droop Skin: Warm and dry Psychiatric: Normal mood and affect   ED Course  Procedures (including critical care time)   MDM   Nursing notes and vitals signs, including pulse oximetry,  reviewed.  Summary of this visit's results, reviewed by myself:  Labs:  Results for orders placed or performed during the hospital encounter of 11/29/15 (from the past 24 hour(s))  Urinalysis, Routine w reflex microscopic ([facility])     Status: Abnormal   Collection Time: 11/29/15  1:55 AM  Result Value Ref Range   Color, Urine ORANGE (A) YELLOW   APPearance CLOUDY (A) CLEAR   Specific Gravity, Urine 1.010 1.005 - 1.030   pH 7.5 5.0 - 8.0   Glucose, UA NEGATIVE NEGATIVE mg/dL   Hgb urine dipstick MODERATE (A) NEGATIVE   Bilirubin Urine NEGATIVE NEGATIVE   Ketones, ur NEGATIVE NEGATIVE mg/dL   Protein, ur 829100 (A) NEGATIVE mg/dL   Nitrite POSITIVE (A) NEGATIVE   Leukocytes, UA LARGE (A) NEGATIVE  Pregnancy, urine     Status: None  Collection Time: 11/29/15  1:55 AM  Result Value Ref Range   Preg Test, Ur NEGATIVE NEGATIVE  Urine microscopic-add on     Status: Abnormal   Collection Time: 11/29/15  1:55 AM  Result Value Ref Range   Squamous Epithelial / LPF 6-30 (A) NONE SEEN   WBC, UA TOO NUMEROUS TO COUNT 0 - 5 WBC/hpf   RBC / HPF 6-30 0 - 5 RBC/hpf   Bacteria, UA MANY (A) NONE SEEN         Paula Libra, MD 11/29/15 [phone]

## 2015-11-29 NOTE — ED Notes (Signed)
Pt verbalizes understanding of d/c instructions and denies any further needs at this time. 

## 2015-12-01 LAB — URINE CULTURE

## 2015-12-03 ENCOUNTER — Telehealth (HOSPITAL_COMMUNITY): Payer: Self-pay

## 2015-12-03 NOTE — Telephone Encounter (Signed)
Left message @ [phone] for pt to return call.  Other number [phone] pt unavailable.  [facility]

## 2015-12-06 ENCOUNTER — Telehealth (HOSPITAL_COMMUNITY): Payer: Self-pay

## 2015-12-06 NOTE — Telephone Encounter (Signed)
Pt informed of dx and need for addl tx.  Rx called in and given to the RPh @ [facility] [phone].

## 2015-12-28 ENCOUNTER — Encounter (HOSPITAL_BASED_OUTPATIENT_CLINIC_OR_DEPARTMENT_OTHER): Payer: Self-pay | Admitting: *Deleted

## 2015-12-28 ENCOUNTER — Emergency Department (HOSPITAL_BASED_OUTPATIENT_CLINIC_OR_DEPARTMENT_OTHER)
Admission: EM | Admit: 2015-12-28 | Discharge: 2015-12-28 | Disposition: A | Discharge status: Home / Self Care | Payer: Medicare Other | Attending: Emergency Medicine | Admitting: Emergency Medicine

## 2015-12-28 DIAGNOSIS — R21 Rash and other nonspecific skin eruption: Secondary | ICD-10-CM | POA: Diagnosis present

## 2015-12-28 DIAGNOSIS — T782XXA Anaphylactic shock, unspecified, initial encounter: Secondary | ICD-10-CM

## 2015-12-28 DIAGNOSIS — F329 Major depressive disorder, single episode, unspecified: Secondary | ICD-10-CM | POA: Insufficient documentation

## 2015-12-28 DIAGNOSIS — F419 Anxiety disorder, unspecified: Secondary | ICD-10-CM | POA: Insufficient documentation

## 2015-12-28 DIAGNOSIS — L02419 Cutaneous abscess of limb, unspecified: Secondary | ICD-10-CM

## 2015-12-28 DIAGNOSIS — F909 Attention-deficit hyperactivity disorder, unspecified type: Secondary | ICD-10-CM | POA: Insufficient documentation

## 2015-12-28 DIAGNOSIS — Y9389 Activity, other specified: Secondary | ICD-10-CM | POA: Diagnosis not present

## 2015-12-28 DIAGNOSIS — X58XXXA Exposure to other specified factors, initial encounter: Secondary | ICD-10-CM | POA: Insufficient documentation

## 2015-12-28 DIAGNOSIS — Y998 Other external cause status: Secondary | ICD-10-CM | POA: Diagnosis not present

## 2015-12-28 DIAGNOSIS — F17210 Nicotine dependence, cigarettes, uncomplicated: Secondary | ICD-10-CM | POA: Diagnosis not present

## 2015-12-28 DIAGNOSIS — L02411 Cutaneous abscess of right axilla: Secondary | ICD-10-CM | POA: Diagnosis not present

## 2015-12-28 DIAGNOSIS — Y9289 Other specified places as the place of occurrence of the external cause: Secondary | ICD-10-CM | POA: Insufficient documentation

## 2015-12-28 DIAGNOSIS — R0602 Shortness of breath: Secondary | ICD-10-CM | POA: Diagnosis not present

## 2015-12-28 DIAGNOSIS — I10 Essential (primary) hypertension: Secondary | ICD-10-CM | POA: Diagnosis not present

## 2015-12-28 LAB — CBC WITH DIFFERENTIAL/PLATELET
BASOS ABS: 0 10*3/uL (ref 0.0–0.1)
BASOS PCT: 0 %
EOS ABS: 0.2 10*3/uL (ref 0.0–0.7)
EOS PCT: 2 %
HCT: 34.8 % — ABNORMAL LOW (ref 36.0–46.0)
Hemoglobin: 11.4 g/dL — ABNORMAL LOW (ref 12.0–15.0)
LYMPHS PCT: 23 %
Lymphs Abs: 2.4 10*3/uL (ref 0.7–4.0)
MCH: 27.9 pg (ref 26.0–34.0)
MCHC: 32.8 g/dL (ref 30.0–36.0)
MCV: 85.3 fL (ref 78.0–100.0)
Monocytes Absolute: 0.6 10*3/uL (ref 0.1–1.0)
Monocytes Relative: 6 %
Neutro Abs: 7.2 10*3/uL (ref 1.7–7.7)
Neutrophils Relative %: 69 %
PLATELETS: 267 10*3/uL (ref 150–400)
RBC: 4.08 MIL/uL (ref 3.87–5.11)
RDW: 13.5 % (ref 11.5–15.5)
WBC: 10.4 10*3/uL (ref 4.0–10.5)

## 2015-12-28 LAB — BASIC METABOLIC PANEL
ANION GAP: 6 (ref 5–15)
BUN: 17 mg/dL (ref 6–20)
CALCIUM: 8 mg/dL — AB (ref 8.9–10.3)
CO2: 25 mmol/L (ref 22–32)
Chloride: 104 mmol/L (ref 101–111)
Creatinine, Ser: 1.01 mg/dL — ABNORMAL HIGH (ref 0.44–1.00)
Glucose, Bld: 99 mg/dL (ref 65–99)
POTASSIUM: 2.9 mmol/L — AB (ref 3.5–5.1)
SODIUM: 135 mmol/L (ref 135–145)

## 2015-12-28 MED ORDER — PREDNISONE 20 MG PO TABS: ORAL_TABLET | ORAL | Status: DC

## 2015-12-28 MED ORDER — LIDOCAINE HCL (PF) 1 % IJ SOLN
5.0000 mL | Freq: Once | INTRAMUSCULAR | Status: AC
  Administered 2015-12-28: 5 mL via INTRADERMAL
  Filled 2015-12-28: qty 5

## 2015-12-28 MED ORDER — POTASSIUM CHLORIDE CRYS ER 20 MEQ PO TBCR
40.0000 meq | EXTENDED_RELEASE_TABLET | Freq: Once | ORAL | Status: AC
  Administered 2015-12-28: 40 meq via ORAL
  Filled 2015-12-28: qty 2

## 2015-12-28 MED ORDER — SODIUM CHLORIDE 0.9 % IV BOLUS (SEPSIS)
1000.0000 mL | Freq: Once | INTRAVENOUS | Status: AC
  Administered 2015-12-28: 1000 mL via INTRAVENOUS

## 2015-12-28 MED ORDER — DIPHENHYDRAMINE HCL 50 MG/ML IJ SOLN
25.0000 mg | Freq: Once | INTRAMUSCULAR | Status: AC
  Administered 2015-12-28: 25 mg via INTRAVENOUS
  Filled 2015-12-28: qty 1

## 2015-12-28 MED ORDER — METHYLPREDNISOLONE SODIUM SUCC 125 MG IJ SOLR
125.0000 mg | Freq: Once | INTRAMUSCULAR | Status: AC
  Administered 2015-12-28: 125 mg via INTRAVENOUS
  Filled 2015-12-28: qty 2

## 2015-12-28 MED ORDER — SULFAMETHOXAZOLE-TRIMETHOPRIM 800-160 MG PO TABS: 1.0000 | ORAL_TABLET | Freq: Two times a day (BID) | ORAL | Status: AC

## 2015-12-28 MED ORDER — FAMOTIDINE IN NACL 20-0.9 MG/50ML-% IV SOLN
20.0000 mg | Freq: Once | INTRAVENOUS | Status: AC
  Administered 2015-12-28: 20 mg via INTRAVENOUS
  Filled 2015-12-28: qty 50

## 2015-12-28 MED ORDER — EPINEPHRINE 0.3 MG/0.3ML IJ SOAJ: 0.3000 mg | Freq: Once | INTRAMUSCULAR | Status: DC

## 2015-12-28 NOTE — ED Provider Notes (Signed)
CSN: [id number]     Arrival date & time 12/28/15  1837 History   First MD Initiated Contact with Patient 12/28/15 1841     Chief Complaint  Patient presents with  . Allergic Reaction     (Consider location/radiation/quality/duration/timing/severity/associated sxs/prior Treatment) HPI Comments: 35 year old female with history of substance abuse, ADHD presents after allergic reaction. Around 5:00 patient started having tingling in extremities and lips, swelling in the lips and then throat swelling sensation. Patient has a history of significant fish allergies and used to have an EpiPen but does not have one. EMS gave the patient epinephrine which improved her symptoms and brought the patient here. Patient also has had an enlarging boil abscess the right axilla of the past 2 days. Patient denies any new medicines drugs or exposures except a friend who works at a fish store was with her today and patient has significant fish allergies.  Patient is a 35 y.o. female presenting with allergic reaction. The history is provided by the patient.  Allergic Reaction Presenting symptoms: rash     Past Medical History  Diagnosis Date  . Anxiety   . Depression   . ADHD (attention deficit hyperactivity disorder)   . Polysubstance abuse     - Hx ETOH, cocaine, THC, IV heroin, narcotics  . Knee pain, bilateral     - patellofemoral syndrome, followed by Dr. [name] at [facility]  . Migraines   . Borderline systolic HTN   . Psychiatric care     Previous [facility] H&P notes PMHx Schizophrenia, though patient denies this.  . Hypertension    Past Surgical History  Procedure Laterality Date  . Knee surgery    . Shoulder surgery     History reviewed. No pertinent family history. Social History  Substance Use Topics  . Smoking status: Current Every Day Smoker -- 1.00 packs/day    Types: Cigarettes  . Smokeless tobacco: Never Used  . Alcohol Use: No   OB History    No data available     Review of Systems   Constitutional: Negative for fever and chills.  HENT: Negative for congestion.   Eyes: Negative for visual disturbance.  Respiratory: Positive for shortness of breath.   Cardiovascular: Negative for chest pain.  Gastrointestinal: Negative for vomiting and abdominal pain.  Genitourinary: Negative for dysuria and flank pain.  Musculoskeletal: Negative for back pain, neck pain and neck stiffness.  Skin: Positive for rash and wound.  Neurological: Negative for light-headedness and headaches.      Allergies  Shellfish allergy  Home Medications   Prior to Admission medications   Medication Sig Start Date End Date Taking? Authorizing Provider  ALPRAZolam Prudy Feeler) 1 MG tablet Take 1 mg by mouth at bedtime as needed for anxiety.    Historical Provider, MD  amphetamine-dextroamphetamine (ADDERALL XR) 20 MG 24 hr capsule Take 20 mg by mouth daily.    Historical Provider, MD  CarBAMazepine (TEGRETOL PO) Take 50 mg by mouth at bedtime.    Historical Provider, MD  EPINEPHrine 0.3 mg/0.3 mL IJ SOAJ injection Inject 0.3 mLs (0.3 mg total) into the muscle once. 12/28/15   Blane Ohara, MD  phenazopyridine (PYRIDIUM) 200 MG tablet Take 1 tablet (200 mg total) by mouth 3 (three) times daily. 11/29/15   John Molpus, MD  sulfamethoxazole-trimethoprim (BACTRIM DS,SEPTRA DS) 800-160 MG tablet Take 1 tablet by mouth 2 (two) times daily. 12/29/15 01/05/16  Blane Ohara, MD   BP 124/90 mmHg  Pulse 87  Temp(Src) 99.3 F (37.4 C) (  Oral)  Resp 18  Ht  (1.702 m)  Wt 162 lb (73.483 kg)  BMI 25.37 kg/m2  SpO2 95%  LMP 12/24/2015 (Exact Date) Physical Exam  Constitutional: She is oriented to person, place, and time. She appears well-developed and well-nourished.  HENT:  Head: Normocephalic and atraumatic.  Eyes: Conjunctivae are normal. Right eye exhibits no discharge. Left eye exhibits no discharge.  Neck: Normal range of motion. Neck supple. No tracheal deviation present.  Cardiovascular: Normal  rate and regular rhythm.   Pulmonary/Chest: Effort normal and breath sounds normal.  Abdominal: Soft. She exhibits no distension. There is no tenderness. There is no guarding.  Musculoskeletal: She exhibits tenderness. She exhibits no edema.  Neurological: She is alert and oriented to person, place, and time.  Skin: Skin is warm. Rash noted.  Patient has urticaria upper back, chest bilateral. Patient has approximate 2 cm area of erythema, tenderness mild induration right axilla  Psychiatric: She has a normal mood and affect.  Nursing note and vitals reviewed.   ED Course  Procedures (including critical care time) CRITICAL CARE Performed by: Enid Skeens   Total critical care time: 35 minutes  Critical care time was exclusive of separately billable procedures and treating other patients.  Critical care was necessary to treat or prevent imminent or life-threatening deterioration.  Critical care was time spent personally by me on the following activities: development of treatment plan with patient and/or surrogate as well as nursing, discussions with consultants, evaluation of patient's response to treatment, examination of patient, obtaining history from patient or surrogate, ordering and performing treatments and interventions, ordering and review of laboratory studies, ordering and review of radiographic studies, pulse oximetry and re-evaluation of patient's condition.  Labs Review Labs Reviewed  CBC WITH DIFFERENTIAL/PLATELET - Abnormal; Notable for the following:    Hemoglobin 11.4 (*)    HCT 34.8 (*)    All other components within normal limits  BASIC METABOLIC PANEL - Abnormal; Notable for the following:    Potassium 2.9 (*)    Creatinine, Ser 1.01 (*)    Calcium 8.0 (*)    All other components within normal limits    Imaging Review No results found. I have personally reviewed and evaluated these images and lab results as part of my medical decision-making.   EKG  Interpretation None      MDM   Final diagnoses:  Axillary abscess  Anaphylaxis, initial encounter  Hypokalemia  Patient presents with clinical anaphylaxis, improved since EpiPen. Plan for [facility] 2-3 hours, steroids, IV fluids. Patient has small abscess right axilla, nonseptic appearing, no fever, plan for blood work and small incision and drainage. Pt improved during observation.   Results and differential diagnosis were discussed with the patient/parent/guardian. Xrays were independently reviewed by myself.  Close follow up outpatient was discussed, comfortable with the plan.   Medications  sodium chloride 0.9 % bolus 1,000 mL (0 mLs Intravenous Stopped 12/28/15 2055)  methylPREDNISolone sodium succinate (SOLU-MEDROL) 125 mg/2 mL injection 125 mg (125 mg Intravenous Given 12/28/15 1913)  diphenhydrAMINE (BENADRYL) injection 25 mg (25 mg Intravenous Given 12/28/15 1913)  lidocaine (PF) (XYLOCAINE) 1 % injection 5 mL (5 mLs Intradermal Given 12/28/15 1913)  famotidine (PEPCID) IVPB 20 mg premix (0 mg Intravenous Stopped 12/28/15 2055)  potassium chloride SA (K-DUR,KLOR-CON) CR tablet 40 mEq (40 mEq Oral Given 12/28/15 2005)    Filed Vitals:   12/28/15 2030 12/28/15 2045 12/28/15 2100 12/28/15 2130  BP: 128/94 117/89 124/90 124/90  Pulse: 91  99 88 87  Temp:      TempSrc:      Resp:   18 18  Height:      Weight:      SpO2: 95% 97% 96% 95%    Final diagnoses:  Axillary abscess  Anaphylaxis, initial encounter       Blane Ohara, MD 12/28/15 2151

## 2015-12-28 NOTE — ED Notes (Signed)
PA at [facility] with [place] for R axillary abscess

## 2015-12-28 NOTE — ED Provider Notes (Signed)
EMERGENCY DEPARTMENT US [place] TISSUE INTERPRETATION "Study: Limited Ultrasound of the noted body part in comments below"  INDICATIONS: Pain and Soft tissue infection Multiple views of the body part are obtained with a multi-frequency linear probe  PERFORMED BY:  Myself  IMAGES ARCHIVED?: Yes  SIDE:Right   BODY PART:Axilla  FINDINGS: Abcess present and Cellulitis present  LIMITATIONS:  Body Habitus  INTERPRETATION:  Abcess present and Cellulitis present  COMMENT:  Fluid collection visualized    INCISION AND DRAINAGE Performed by: Dierdre Forth Consent: Verbal consent obtained. Risks and benefits: risks, benefits and alternatives were discussed Type: abscess  Body area: right axilla  Anesthesia: local infiltration  Incision was made with a scalpel.  Local anesthetic: lidocaine 1% without epinephrine  Anesthetic total: 2.5 ml  Complexity: complex Blunt dissection to break up loculations  Drainage: purulent  Drainage amount: moderate  Patient tolerance: Patient tolerated the procedure well with no immediate complications.     Dahlia Client Santiaga Butzin, PA-C 12/28/15 2133  Blane Ohara, MD 12/29/15 [phone]

## 2015-12-28 NOTE — ED Notes (Signed)
Tracheal sounds clear 

## 2015-12-28 NOTE — Discharge Instructions (Signed)
Use epi pen if needed then seek medical care immediately/ call EMS. Soak wound twice daily.  Take antibiotics as discussed for spreading redness around the abscess area, fevers or other concerns. If you were given medicines take as directed.  If you are on coumadin or contraceptives realize their levels and effectiveness is altered by many different medicines.  If you have any reaction (rash, tongues swelling, other) to the medicines stop taking and see a physician.    If your blood pressure was elevated in the ER make sure you follow up for management with a primary doctor or return for chest pain, shortness of breath or stroke symptoms.  Please follow up as directed and return to the ER or see a physician for new or worsening symptoms.  Thank you. Filed Vitals:   12/28/15 1835 12/28/15 1837 12/28/15 1916  BP: 129/90 127/94 129/77  Pulse: 92 96 90  Temp: 99.3 F (37.4 C) 99.3 F (37.4 C)   TempSrc: Oral Oral   Resp: Height:  (1.702 m)  (1.702 m)   Weight: 180 lb (81.647 kg) 162 lb (73.483 kg)   SpO2: 97% 98% 100%

## 2015-12-28 NOTE — ED Notes (Signed)
Pt alert, NAD, calm, interactive, resps e/u, speaking in clear complete sentences, VSS, no dyspnea noted, c/o itching increasing, denies sob, LS CTA, throat stable, no increased swelling or change, tolerated PO KCL.

## 2015-12-28 NOTE — ED Notes (Signed)
Out shopping, face felt full, hands began having itching and whole body felt like it was itching, throat felt to begin full. Pt took  Benadryl Tab prior to EMS arrival, EMS states they administered epi SQ

## 2015-12-28 NOTE — ED Notes (Addendum)
EDPA at [facility] for I&D, in progress. Itching and allergic reaction sx stable, not increased. Alert, NAD, calm, interactive, no dyspnea or dysphagia noted.

## 2015-12-28 NOTE — ED Notes (Signed)
Also having rt axilla pain, area noted to be swollen and red in color, pt states is very tender to touch.

## 2016-04-01 ENCOUNTER — Encounter (HOSPITAL_BASED_OUTPATIENT_CLINIC_OR_DEPARTMENT_OTHER): Payer: Self-pay | Admitting: Emergency Medicine

## 2016-04-01 ENCOUNTER — Emergency Department (HOSPITAL_BASED_OUTPATIENT_CLINIC_OR_DEPARTMENT_OTHER)
Admission: EM | Admit: 2016-04-01 | Discharge: 2016-04-01 | Disposition: A | Discharge status: Home / Self Care | Payer: Medicare Other | Attending: Emergency Medicine | Admitting: Emergency Medicine

## 2016-04-01 DIAGNOSIS — F17210 Nicotine dependence, cigarettes, uncomplicated: Secondary | ICD-10-CM | POA: Diagnosis not present

## 2016-04-01 DIAGNOSIS — F329 Major depressive disorder, single episode, unspecified: Secondary | ICD-10-CM | POA: Diagnosis not present

## 2016-04-01 DIAGNOSIS — I10 Essential (primary) hypertension: Secondary | ICD-10-CM | POA: Diagnosis not present

## 2016-04-01 DIAGNOSIS — Z79899 Other long term (current) drug therapy: Secondary | ICD-10-CM | POA: Insufficient documentation

## 2016-04-01 DIAGNOSIS — R252 Cramp and spasm: Secondary | ICD-10-CM

## 2016-04-01 DIAGNOSIS — M629 Disorder of muscle, unspecified: Secondary | ICD-10-CM | POA: Insufficient documentation

## 2016-04-01 LAB — URINE MICROSCOPIC-ADD ON

## 2016-04-01 LAB — URINALYSIS, ROUTINE W REFLEX MICROSCOPIC
Bilirubin Urine: NEGATIVE
Glucose, UA: NEGATIVE mg/dL
Ketones, ur: NEGATIVE mg/dL
Nitrite: NEGATIVE
PROTEIN: 100 mg/dL — AB
SPECIFIC GRAVITY, URINE: 1.023 (ref 1.005–1.030)
pH: 5.5 (ref 5.0–8.0)

## 2016-04-01 LAB — RAPID URINE DRUG SCREEN, HOSP PERFORMED
Amphetamines: POSITIVE — AB
BARBITURATES: NOT DETECTED
BENZODIAZEPINES: NOT DETECTED
COCAINE: POSITIVE — AB
Opiates: POSITIVE — AB
Tetrahydrocannabinol: NOT DETECTED

## 2016-04-01 LAB — PREGNANCY, URINE: PREG TEST UR: NEGATIVE

## 2016-04-01 MED ORDER — DIPHENHYDRAMINE HCL 25 MG PO CAPS
25.0000 mg | ORAL_CAPSULE | Freq: Once | ORAL | Status: AC
Start: 2016-04-01 — End: 2016-04-01
  Administered 2016-04-01: 25 mg via ORAL
  Filled 2016-04-01: qty 1

## 2016-04-01 NOTE — Discharge Instructions (Signed)
You were seen and evaluated today for your uncontrolled movements. Follow-up with your primary care physician. Use Benadryl as needed to help control your movements.  Dystonia Dystonia is a condition that makes your muscles contract without warning (muscle spasms). It can make doing everyday tasks hard. There are different forms of dystonia. The condition can affect just one part of your body, or it can affect larger areas of your body. Dystonia affects people in different ways. In some people, it is mild and goes away over time, while others may need treatment. Although there is no cure for dystonia, you can manage the condition with treatment. CAUSES  Dystonia may be caused by:  Genetics. This means you inherited the genes that cause you to be at risk for dystonia.  An abnormality in the part of your brain that controls movement (basal ganglia). Dystonia may also be acquired. If you have acquired dystonia, you developed the condition after:  Brain injury.  Infection.  Drug reaction. The cause of dystonia may also not be known (idiopathic dystonia).  SIGNS AND SYMPTOMS Signs and symptoms of dystonia can depend on which type of the condition you have. Common signs and symptoms include:  Muscle twitches or spasms around your eyes (blepharospasm).  Foot cramping or dragging.  Pulling of your neck to one side (torticollis).  Muscles spasms of the face.  Spasms of the voice box.  Tremors.  Awkward and painful positions.  Muscle cramping after muscle use. DIAGNOSIS  Your health care provider can diagnose dystonia based on your symptoms and medical history. Your health care provider will also do a physical exam. You may also have:   A blood test to check for genes that cause dystonia.  Brain imaging tests to rule out other causes of your symptoms. There are no tests that can diagnose other causes of dystonia. TREATMENT  There are no treatments that can cure or prevent dystonia.  Treatment to manage dystonia may include:   Injecting the affected muscles with a chemical (botulinum) that blocks muscle spasms. This treatment can block spasms for a few days to a few months.  Medicines to relax muscles. HOME CARE INSTRUCTIONS  Physical therapy to improve muscle strength and movement may be suggested by your health care provider. Continue your physical therapy exercises at home as instructed by your physical therapist.  Make sure you have a good support system. Let your health care provider know if you are struggling with stress or anxiety.  Keep all follow-up visits as directed by your health care provider. This is important.  Take medicines only as directed by your health care provider. SEEK MEDICAL CARE IF:  Your condition is changing or getting worse.  You need more support at home.   This information is not intended to replace advice given to you by your health care provider. Make sure you discuss any questions you have with your health care provider.   Document Released: 11/13/2002 Document Revised: 12/14/2014 Document Reviewed: 01/17/2014 Elsevier Interactive Patient Education [employer]2016 [employer] Inc.

## 2016-04-01 NOTE — ED Provider Notes (Signed)
CSN: [id number]     Arrival date & time 04/01/16  1037 History   First MD Initiated Contact with Patient 04/01/16 1052     Chief Complaint  Patient presents with  . Spasms     (Consider location/radiation/quality/duration/timing/severity/associated sxs/prior Treatment) HPI Comments: 35 year old female with history of anxiety, depression, ADHD, polysubstance abuse presents for spasms. 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 She states that she is currently part of a lawsuit against [employer]il[employer] because of this issue. Her story keeps changing during evaluation. Initially she said that these movements started today on her way to the ER with her significant other but then stated that this is the best she has been able to control the movements in a long time and that they are continuous. She denies fevers or chills. Denies headache. She reports that this is an intermittent chronic issue. She states that she has been out of all her medications the last 2-1/2 weeks because she has not followed up with her primary care physician and they will not refill her medications over the phone.   Past Medical History  Diagnosis Date  . Anxiety   . Depression   . ADHD (attention deficit hyperactivity disorder)   . Polysubstance abuse     - Hx ETOH, cocaine, THC, IV heroin, narcotics  . Knee pain, bilateral     - patellofemoral syndrome, [facility]  . Migraines   . Borderline systolic HTN   . Psychiatric care     Previous [facility] H&P notes PMHx Schizophrenia, though patient denies this.  . Hypertension    Past Surgical History  Procedure Laterality Date  . Knee surgery    . Shoulder surgery     No family history on file. Social History  Substance Use Topics  . Smoking status: Current Every Day Smoker -- 1.00 packs/day    Types: Cigarettes  . Smokeless tobacco: Never Used  . Alcohol Use: No   OB History    No data available     Review of  Systems  Constitutional: Negative for chills and fatigue.  HENT: Negative for congestion, rhinorrhea, sinus pressure and sneezing.   Eyes: Negative for pain and visual disturbance.  Respiratory: Negative for cough and shortness of breath.   Gastrointestinal: Negative for nausea, vomiting and abdominal pain.  Genitourinary: Negative for dysuria.  Musculoskeletal: Negative for myalgias, back pain and neck pain.  Skin: Negative for rash.  Neurological: Negative for dizziness, seizures, syncope, facial asymmetry, speech difficulty, weakness, light-headedness, numbness and headaches.       Uncontrolled movements of her limbs  Hematological: Does not bruise/bleed easily.      Allergies  Shellfish allergy  Home Medications   Prior to Admission medications   Medication Sig Start Date End Date Taking? Authorizing Provider  ALPRAZolam Prudy Feeler(XANAX) 1 MG tablet Take 1 mg by mouth at bedtime as needed for anxiety.   Yes Historical Provider, MD  amphetamine-dextroamphetamine (ADDERALL XR) 20 MG 24 hr capsule Take 20 mg by mouth daily.   Yes Historical Provider, MD  CarBAMazepine (TEGRETOL PO) Take 50 mg by mouth at bedtime.   Yes Historical Provider, MD  gabapentin (NEURONTIN) 300 MG capsule Take 300 mg by mouth 4 (four) times daily.   Yes Historical Provider, MD   BP 105/41 mmHg  Pulse 123  Temp(Src) 98.8 F (37.1 C) (Oral)  Resp 18  Ht 5\' 7"  (1.702 m)  Wt 151 lb 6.4 oz (68.675  kg)  BMI 23.71 kg/m2  SpO2 98% Physical Exam  Constitutional: She is oriented to person, place, and time. She appears well-developed and well-nourished. No distress.  HENT:  Head: Normocephalic and atraumatic.  Right Ear: External ear normal.  Left Ear: External ear normal.  Nose: Nose normal.  Mouth/Throat: Oropharynx is clear and moist. No oropharyngeal exudate.  Eyes: EOM are normal. Pupils are equal, round, and reactive to light.  Neck: Normal range of motion. Neck supple.  Cardiovascular: Regular rhythm,  normal heart sounds and intact distal pulses.   No murmur heard. Pulmonary/Chest: Effort normal. No respiratory distress. She has no wheezes. She has no rales.  Abdominal: Soft. She exhibits no distension. There is no tenderness.  Musculoskeletal: Normal range of motion. She exhibits no edema or tenderness.  Neurological: She is alert and oriented to person, place, and time.  Patient arriving and moving all of the bed. She did not fall out of the bed although continued to scoot down the bed. Movements are not coordinated but seemed controlled as patient is able to hold still for blood pressure reading as well as for respiratory examination. Movements or spastic in nature. She has normal tone and sensation on examination.  Skin: Skin is warm and dry. No rash noted. She is not diaphoretic.  Vitals reviewed.   ED Course  Procedures (including critical care time) Labs Review Labs Reviewed  URINE RAPID DRUG SCREEN, HOSP PERFORMED - Abnormal; Notable for the following:    Opiates POSITIVE (*)    Cocaine POSITIVE (*)    Amphetamines POSITIVE (*)    All other components within normal limits  URINALYSIS, ROUTINE W REFLEX MICROSCOPIC (NOT AT [facility]) - Abnormal; Notable for the following:    APPearance CLOUDY (*)    Hgb urine dipstick LARGE (*)    Protein, ur 100 (*)    Leukocytes, UA MODERATE (*)    All other components within normal limits  URINE MICROSCOPIC-ADD ON - Abnormal; Notable for the following:    Squamous Epithelial / LPF 0-5 (*)    Bacteria, UA FEW (*)    All other components within normal limits  PREGNANCY, URINE    Imaging Review No results found. I have personally reviewed and evaluated these images and lab results as part of my medical decision-making.   EKG Interpretation None      MDM  Patient was seen and evaluated in stable condition. Currently on her menses. Patient was given Benadryl for possible dystonic reaction. UA with blood in secondary to menses but  otherwise unremarkable. Urine drug screen positive for opiates, cocaine, amphetamines. While in the emergency department the patient suddenly stated that she felt better wanted to be discharged. Patient was discharged in stable condition with a neurology referral. She was instructed to also follow up with her primary care physician. Final diagnoses:  None    1. Spasms     Leta Baptist, MD 04/01/16 1610

## 2016-04-01 NOTE — ED Notes (Signed)
Pt states she is having dystonic reactions from tardive dyskinesia.  Pt states it is getting worse.  Pt is writhing on the bed.  Pt states she is not on any tegretol lately due to cost of medication.

## 2016-04-01 NOTE — ED Notes (Signed)
Safety padding applied to both railings on stretcher.

## 2016-04-30 ENCOUNTER — Emergency Department (HOSPITAL_BASED_OUTPATIENT_CLINIC_OR_DEPARTMENT_OTHER)
Admission: EM | Admit: 2016-04-30 | Discharge: 2016-04-30 | Disposition: A | Discharge status: Home / Self Care | Payer: Medicare Other | Attending: Emergency Medicine | Admitting: Emergency Medicine

## 2016-04-30 ENCOUNTER — Encounter (HOSPITAL_BASED_OUTPATIENT_CLINIC_OR_DEPARTMENT_OTHER): Payer: Self-pay

## 2016-04-30 DIAGNOSIS — N12 Tubulo-interstitial nephritis, not specified as acute or chronic: Secondary | ICD-10-CM | POA: Diagnosis not present

## 2016-04-30 DIAGNOSIS — I10 Essential (primary) hypertension: Secondary | ICD-10-CM | POA: Insufficient documentation

## 2016-04-30 DIAGNOSIS — F17210 Nicotine dependence, cigarettes, uncomplicated: Secondary | ICD-10-CM | POA: Insufficient documentation

## 2016-04-30 DIAGNOSIS — F329 Major depressive disorder, single episode, unspecified: Secondary | ICD-10-CM | POA: Diagnosis not present

## 2016-04-30 DIAGNOSIS — R109 Unspecified abdominal pain: Secondary | ICD-10-CM | POA: Diagnosis present

## 2016-04-30 DIAGNOSIS — F909 Attention-deficit hyperactivity disorder, unspecified type: Secondary | ICD-10-CM | POA: Diagnosis not present

## 2016-04-30 HISTORY — DX: Opioid abuse, uncomplicated: F11.10

## 2016-04-30 HISTORY — DX: Unspecified convulsions: R56.9

## 2016-04-30 HISTORY — DX: Calculus of kidney: N20.0

## 2016-04-30 LAB — URINE MICROSCOPIC-ADD ON

## 2016-04-30 LAB — URINALYSIS, ROUTINE W REFLEX MICROSCOPIC
BILIRUBIN URINE: NEGATIVE
Glucose, UA: NEGATIVE mg/dL
KETONES UR: NEGATIVE mg/dL
NITRITE: NEGATIVE
PH: 5.5 (ref 5.0–8.0)
Protein, ur: 100 mg/dL — AB
SPECIFIC GRAVITY, URINE: 1.019 (ref 1.005–1.030)

## 2016-04-30 LAB — PREGNANCY, URINE: Preg Test, Ur: NEGATIVE

## 2016-04-30 MED ORDER — CEPHALEXIN 500 MG PO CAPS: 500.0000 mg | ORAL_CAPSULE | Freq: Three times a day (TID) | ORAL | Status: DC

## 2016-04-30 MED ORDER — CEFTRIAXONE SODIUM 1 G IJ SOLR
1.0000 g | Freq: Once | INTRAMUSCULAR | Status: AC
  Administered 2016-04-30: 1 g via INTRAVENOUS
  Filled 2016-04-30: qty 10

## 2016-04-30 MED ORDER — CEFTRIAXONE SODIUM 1 G IJ SOLR
1.0000 g | Freq: Once | INTRAMUSCULAR | Status: AC
  Administered 2016-04-30: 1 g via INTRAMUSCULAR
  Filled 2016-04-30: qty 10

## 2016-04-30 MED ORDER — KETOROLAC TROMETHAMINE 30 MG/ML IJ SOLN
30.0000 mg | Freq: Once | INTRAMUSCULAR | Status: AC
  Administered 2016-04-30: 30 mg via INTRAVENOUS
  Filled 2016-04-30: qty 1

## 2016-04-30 MED ORDER — LIDOCAINE HCL (PF) 1 % IJ SOLN
2.1000 mL | Freq: Once | INTRAMUSCULAR | Status: AC
  Administered 2016-04-30: 2.1 mL
  Filled 2016-04-30: qty 5

## 2016-04-30 MED ORDER — HYDROCODONE-ACETAMINOPHEN 5-325 MG PO TABS: 2.0000 | ORAL_TABLET | ORAL | Status: DC | PRN

## 2016-04-30 MED ORDER — SODIUM CHLORIDE 0.9 % IV BOLUS (SEPSIS)
1000.0000 mL | Freq: Once | INTRAVENOUS | Status: AC
  Administered 2016-04-30: 1000 mL via INTRAVENOUS

## 2016-04-30 MED ORDER — ACETAMINOPHEN 500 MG PO TABS
1000.0000 mg | ORAL_TABLET | Freq: Once | ORAL | Status: AC
  Administered 2016-04-30: 1000 mg via ORAL
  Filled 2016-04-30: qty 2

## 2016-04-30 MED ORDER — ONDANSETRON HCL 4 MG PO TABS: 4.0000 mg | ORAL_TABLET | Freq: Four times a day (QID) | ORAL | Status: DC

## 2016-04-30 NOTE — ED Notes (Signed)
Pt walked out by [employer] to car.

## 2016-04-30 NOTE — ED Provider Notes (Signed)
CSN: [id number]     Arrival date & time 04/30/16  1552 History   First MD Initiated Contact with Patient 04/30/16 1618     Chief Complaint  Patient presents with  . Flank Pain     (Consider location/radiation/quality/duration/timing/severity/associated sxs/prior Treatment) HPI [name] is a 35 y.o. female here for evaluation of left-sided flank pain. Patient reports symptoms have been ongoing over the past 3 days. She reports associated nausea, one episode of emesis today, nonbloody and nonbilious. She reports subjective fevers at home. Felt like she had a UTI roughly 1 week ago, this seemed to get better. Symptoms improved with ibuprofen, not relieved by heroin. Denies abdominal pain, vaginal bleeding or discharge. Last normal menstrual cycle last week and was normal for her.No other modifying factors.  Past Medical History  Diagnosis Date  . Anxiety   . Depression   . ADHD (attention deficit hyperactivity disorder)   . Polysubstance abuse     - Hx ETOH, cocaine, THC, IV heroin, narcotics  . Knee pain, bilateral     - patellofemoral syndrome, [facility]  . Migraines   . Borderline systolic HTN   . Psychiatric care     Previous [facility] H&P notes PMHx Schizophrenia, though patient denies this.  . Hypertension   . Kidney stone   . Seizures (HCC)   . Heroin abuse    Past Surgical History  Procedure Laterality Date  . Knee surgery    . Shoulder surgery     No family history on file. Social History  Substance Use Topics  . Smoking status: Current Every Day Smoker -- 1.00 packs/day    Types: Cigarettes  . Smokeless tobacco: Never Used  . Alcohol Use: No   OB History    No data available     Review of Systems A 10 point review of systems was completed and was negative except for pertinent positives and negatives as mentioned in the history of present illness    Allergies  Shellfish allergy  Home Medications   Prior to Admission medications    Medication Sig Start Date End Date Taking? Authorizing Provider  ALPRAZolam Prudy Feeler(XANAX) 1 MG tablet Take 1 mg by mouth at bedtime as needed for anxiety.    Historical Provider, MD  amphetamine-dextroamphetamine (ADDERALL XR) 20 MG 24 hr capsule Take 20 mg by mouth daily.    Historical Provider, MD  CarBAMazepine (TEGRETOL PO) Take 50 mg by mouth at bedtime.    Historical Provider, MD  gabapentin (NEURONTIN) 300 MG capsule Take 300 mg by mouth 4 (four) times daily.    Historical Provider, MD   BP 132/80 mmHg  Pulse 102  Temp(Src) 99.6 F (37.6 C) (Oral)  Resp 18  Ht 5\' 7"  (1.702 m)  Wt 65.318 kg  BMI 22.55 kg/m2  SpO2 100%  LMP 04/21/2016 Physical Exam  Constitutional: She is oriented to person, place, and time. She appears well-developed and well-nourished.  HENT:  Head: Normocephalic and atraumatic.  Mouth/Throat: Oropharynx is clear and moist.  Eyes: Conjunctivae are normal. Pupils are equal, round, and reactive to light. Right eye exhibits no discharge. Left eye exhibits no discharge. No scleral icterus.  Neck: Neck supple.  Cardiovascular: Normal rate, regular rhythm and normal heart sounds.   Pulmonary/Chest: Effort normal and breath sounds normal. No respiratory distress. She has no wheezes. She has no rales.  Abdominal: Soft. She exhibits no distension. There is no tenderness. There is no rebound and no guarding.  Musculoskeletal: She exhibits no tenderness.  Neurological: She is alert and oriented to person, place, and time.  Cranial Nerves II-XII grossly intact  Skin: Skin is warm and dry. No rash noted.  Psychiatric: She has a normal mood and affect.  Nursing note and vitals reviewed.   ED Course  Procedures (including critical care time) Labs Review Labs Reviewed  URINALYSIS, ROUTINE W REFLEX MICROSCOPIC (NOT AT [facility]) - Abnormal; Notable for the following:    APPearance TURBID (*)    Hgb urine dipstick LARGE (*)    Protein, ur 100 (*)    Leukocytes, UA LARGE (*)     All other components within normal limits  URINE MICROSCOPIC-ADD ON - Abnormal; Notable for the following:    Squamous Epithelial / LPF 6-30 (*)    Bacteria, UA MANY (*)    All other components within normal limits  PREGNANCY, URINE    Imaging Review No results found. I have personally reviewed and evaluated these images and lab results as part of my medical decision-making.   EKG Interpretation None     Meds given in ED:  Medications  ketorolac (TORADOL) 30 MG/ML injection 30 mg (not administered)  sodium chloride 0.9 % bolus 1,000 mL (not administered)  acetaminophen (TYLENOL) tablet 1,000 mg (not administered)  cefTRIAXone (ROCEPHIN) 1 g in dextrose 5 % 50 mL IVPB (not administered)    New Prescriptions   No medications on file   Filed Vitals:   04/30/16 1558  BP: 132/80  Pulse: 102  Temp: 99.6 F (37.6 C)  TempSrc: Oral  Resp: 18  Height:  (1.702 m)  Weight: 65.318 kg  SpO2: 100%    MDM  [name] is a 35 y.o. female here for evaluation of left flank pain. On arrival, mildly tachycardic at 102. She is afebrile at 99.6. She is tolerating oral fluids. She has benign abdominal exam. However, clinical picture concerning for pyelonephritis. We will obtain urinalysis. Urine shows evidence of infection. Given history of emesis, flank pain and UTI, will treat for pyelonephritis. Urine culture obtained. She was given fluids, 1 g of Rocephin. Plan to discharge home with anti-biotics, anti-emetics and short course pain medicine. Discussed at length the cessation of heroin.Strict return precautions discussed. Prior to patient discharge, I discussed and reviewed this case with Dr.Zackowski Patient overall appears well, nontoxic, hemodynamically stable and appropriate for discharge.   Final diagnoses:  Pyelonephritis       Joycie Peek, PA-C 04/30/16 1741  [name], MD 05/02/16 1729

## 2016-04-30 NOTE — ED Notes (Addendum)
IV fluids and rocephin discontinued due to pt vein infiltrating, pt received appx 50 cc's fluid and no rocephin IV. IV removed.  Ben, [place] notified and gave instruction to give pt rocephin IM 1g and to PO challenge pt.  Pt given gingerale.

## 2016-04-30 NOTE — ED Notes (Signed)
Left flank pain x 3 days-fever, n/v x 2 days

## 2016-04-30 NOTE — Discharge Instructions (Signed)
You will be treated for a kidney infection with antibiotics. Take all of your antibiotics as prescribed. Do not save or share them. Take for nausea medicine as needed. Use your pain medicine as prescribed for severe pain. Continue using Motrin for mild to moderate pain. Return to ED for any new or worsening symptoms.  Pyelonephritis, Adult Pyelonephritis is a kidney infection. The kidneys are the organs that filter a person's blood and move waste out of the bloodstream and into the urine. Urine passes from the kidneys, through the ureters, and into the bladder. There are two main types of pyelonephritis:  Infections that come on quickly without any warning (acute pyelonephritis).  Infections that last for a long period of time (chronic pyelonephritis). In most cases, the infection clears up with treatment and does not cause further problems. More severe infections or chronic infections can sometimes spread to the bloodstream or lead to other problems with the kidneys. CAUSES This condition is usually caused by:  Bacteria traveling from the bladder to the kidney through infected urine. The urine in the bladder can become infected with bacteria from:  Bladder infection (cystitis).  Inflammation of the prostate gland (prostatitis).  Sexual intercourse, in females.  Bacteria traveling from the bloodstream to the kidney. RISK FACTORS This condition is more likely to develop in:  Pregnant women.  Older people.  People who have diabetes.  People who have kidney stones or bladder stones.  People who have other abnormalities of the kidney or ureter.  People who have a catheter placed in the bladder.  People who have cancer.  People who are sexually active.  Women who use spermicides.  People who have had a prior urinary tract infection. SYMPTOMS Symptoms of this condition include:  Frequent urination.  Strong or persistent urge to urinate.  Burning or stinging when  urinating.  Abdominal pain.  Back pain.  Pain in the side or flank area.  Fever.  Chills.  Blood in the urine, or dark urine.  Nausea.  Vomiting. DIAGNOSIS This condition may be diagnosed based on:  Medical history and physical exam.  Urine tests.  Blood tests. You may also have imaging tests of the kidneys, such as an ultrasound or CT scan. TREATMENT Treatment for this condition may depend on the severity of the infection.  If the infection is mild and is found early, you may be treated with antibiotic medicines taken by mouth. You will need to drink fluids to remain hydrated.  If the infection is more severe, you may need to stay in the hospital and receive antibiotics given directly into a vein through an IV tube. You may also need to receive fluids through an IV tube if you are not able to remain hydrated. After your hospital stay, you may need to take oral antibiotics for a period of time. Other treatments may be required, depending on the cause of the infection. HOME CARE INSTRUCTIONS Medicines  Take over-the-counter and prescription medicines only as told by your health care provider.  If you were prescribed an antibiotic medicine, take it as told by your health care provider. Do not stop taking the antibiotic even if you start to feel better. General Instructions  Drink enough fluid to keep your urine clear or pale yellow.  Avoid caffeine, tea, and carbonated beverages. They tend to irritate the bladder.  Urinate often. Avoid holding in urine for long periods of time.  Urinate before and after sex.  After a bowel movement, women should cleanse from  front to back. Use each tissue only once.  Keep all follow-up visits as told by your health care provider. This is important. SEEK MEDICAL CARE IF:  Your symptoms do not get better after 2 days of treatment.  Your symptoms get worse.  You have a fever. SEEK IMMEDIATE MEDICAL CARE IF:  You are unable to  take your antibiotics or fluids.  You have shaking chills.  You vomit.  You have severe flank or back pain.  You have extreme weakness or fainting.   This information is not intended to replace advice given to you by your health care provider. Make sure you discuss any questions you have with your health care provider.   Document Released: 11/23/2005 Document Revised: 08/14/2015 Document Reviewed: 03/18/2015 Elsevier Interactive Patient Education [employer]
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
## 2016-05-02 LAB — URINE CULTURE: Culture: >=100000 — AB

## 2016-05-03 ENCOUNTER — Telehealth (HOSPITAL_BASED_OUTPATIENT_CLINIC_OR_DEPARTMENT_OTHER): Payer: Self-pay

## 2016-05-03 NOTE — Telephone Encounter (Signed)
Post ED Visit - Positive Culture Follow-up  Culture report reviewed by antimicrobial stewardship pharmacist:  []  Enzo BiNathan Batchelder, Pharm.D. []  Celedonio MiyamotoJeremy Frens, Pharm.D., BCPS [x]  Garvin FilaMike Maccia, Pharm.D. []  Georgina PillionElizabeth Martin, Pharm.D., BCPS []  [place] Center[place]h Pham, Vermont[place]arm.D., BCPS, AAHIVP []  Estella HuskMichelle Turner, Pharm.D., BCPS, AAHIVP []  Tennis Mustassie Stewart, Pharm.D. []  Sherle Poeob Vincent, [address]Pharm.D.  Positive urine culture Treated with Cephalexin, organism sensitive to the same and no further patient follow-up is required at this time.  Jerry CarasCullom, Addalyn Speedy Burnett 05/03/2016, 9:01 AM

## 2016-09-21 DIAGNOSIS — B009 Herpesviral infection, unspecified: Secondary | ICD-10-CM | POA: Insufficient documentation

## 2016-09-21 DIAGNOSIS — G43909 Migraine, unspecified, not intractable, without status migrainosus: Secondary | ICD-10-CM | POA: Insufficient documentation

## 2016-10-21 ENCOUNTER — Telehealth: Payer: Self-pay | Admitting: Neurology

## 2016-10-21 NOTE — Telephone Encounter (Signed)
Kara MeadEmma, please call patient back and let her know I need to unfortunately reschedule her appointment and I will give her a call tonight to do that. But I cannot see her tomorrow, thanks

## 2016-10-21 NOTE — Telephone Encounter (Signed)
[name], set her up Wednesday the 22nd at 4pm, call and make sure it is ok with her. I explained I couldn;t do it tomorrow morning but I will see her.  thanks

## 2016-10-21 NOTE — Telephone Encounter (Signed)
Called patient and advised per AA,MD that she cannot be seen tomorrow. Advised Dr Lucia GaskinsAhern is going to call her to discuss. Advised we apologize for any inconvenience. Patient stated "Why in the world am I just now being told this now when I have had this appointment for three weeks. I am all out of my medication. I have to go through my general practitioner to get referral to neurologist because I was told I had to be seen by neurologist". Apologized again and relayed per AA,MD that she will call to discuss with her later today. We will help the best. Pt verbalized understanding.   Called Mady GemmaKristen Kaplan office and requested they give AA,MD a call per AA,MD to discuss referral. Gave AA,MD number for her to contact her. Spoke with Debbie. Advised Dr Lucia GaskinsAhern is still seeing pt for the day.

## 2016-10-21 NOTE — Telephone Encounter (Signed)
Patient is calling. She needs a [facility] stating that she has an appointment tomorrow 10-22-16 @ 7:30am with Dr. Lucia GaskinsAhern. Please fax to [phone].

## 2016-10-21 NOTE — Telephone Encounter (Signed)
Dr Lucia GaskinsAhern- here is the phone note we were discussing

## 2016-10-22 ENCOUNTER — Ambulatory Visit: Payer: Medicare Other | Admitting: Neurology

## 2016-10-22 NOTE — Telephone Encounter (Signed)
Thanks, when we get it please fax.

## 2016-10-22 NOTE — Telephone Encounter (Signed)
Called patient back. Advised we need her to sign record release form to be able to receive records from her psychiatrist. She can either go to her psychiatrist office to get records faxed to us or come to our office and sign something for us to request from their office. Gave her our fax [phone]. She states she is not sure which office she is going to go to . She has to find a ride. Advised her to let them know she has appt next Wed and we would like them prior to appt. She verbalized understanding.

## 2016-10-22 NOTE — Telephone Encounter (Signed)
Called and spoke to pt. Scheduled appt for 10/28/16 at 4pm per AA,MD request. Pt agreeable to this. Advised pt to check in by 330pm to go over paperwork.   She gave verbal ok to request records from psychiatrist.  Name of psychiatrist:  Milagros Evenerupinder Kaur, [facility] at [facility]. [address], [place], [place] [address] Hours: Open today  8:30AM-6:30PM Phone: [phone]

## 2016-10-23 NOTE — Telephone Encounter (Signed)
Dr Lucia GaskinsAhern- still waiting for this, fyi

## 2016-11-04 ENCOUNTER — Encounter: Payer: Self-pay | Admitting: *Deleted

## 2016-11-04 ENCOUNTER — Ambulatory Visit: Payer: Self-pay | Admitting: Neurology

## 2016-11-04 NOTE — Progress Notes (Signed)
No showed new patient appointment. 

## 2016-11-09 ENCOUNTER — Encounter: Payer: Self-pay | Admitting: Neurology

## 2017-03-02 ENCOUNTER — Emergency Department (HOSPITAL_COMMUNITY)
Admission: EM | Admit: 2017-03-02 | Discharge: 2017-03-03 | Disposition: A | Discharge status: Home / Self Care | Payer: Medicare Other | Attending: Emergency Medicine | Admitting: Emergency Medicine

## 2017-03-02 ENCOUNTER — Encounter (HOSPITAL_COMMUNITY): Payer: Self-pay | Admitting: Emergency Medicine

## 2017-03-02 DIAGNOSIS — I10 Essential (primary) hypertension: Secondary | ICD-10-CM | POA: Diagnosis not present

## 2017-03-02 DIAGNOSIS — F11220 Opioid dependence with intoxication, uncomplicated: Secondary | ICD-10-CM | POA: Diagnosis not present

## 2017-03-02 DIAGNOSIS — F1410 Cocaine abuse, uncomplicated: Secondary | ICD-10-CM | POA: Insufficient documentation

## 2017-03-02 DIAGNOSIS — F1910 Other psychoactive substance abuse, uncomplicated: Secondary | ICD-10-CM

## 2017-03-02 DIAGNOSIS — F909 Attention-deficit hyperactivity disorder, unspecified type: Secondary | ICD-10-CM | POA: Insufficient documentation

## 2017-03-02 DIAGNOSIS — F1510 Other stimulant abuse, uncomplicated: Secondary | ICD-10-CM | POA: Insufficient documentation

## 2017-03-02 DIAGNOSIS — R4182 Altered mental status, unspecified: Secondary | ICD-10-CM | POA: Diagnosis present

## 2017-03-02 DIAGNOSIS — F17210 Nicotine dependence, cigarettes, uncomplicated: Secondary | ICD-10-CM | POA: Diagnosis not present

## 2017-03-02 DIAGNOSIS — F1310 Sedative, hypnotic or anxiolytic abuse, uncomplicated: Secondary | ICD-10-CM | POA: Insufficient documentation

## 2017-03-02 DIAGNOSIS — Z79899 Other long term (current) drug therapy: Secondary | ICD-10-CM | POA: Diagnosis not present

## 2017-03-02 LAB — RAPID URINE DRUG SCREEN, HOSP PERFORMED
AMPHETAMINES: POSITIVE — AB
Barbiturates: POSITIVE — AB
Benzodiazepines: NOT DETECTED
COCAINE: POSITIVE — AB
Opiates: POSITIVE — AB
TETRAHYDROCANNABINOL: NOT DETECTED

## 2017-03-02 LAB — CBC WITH DIFFERENTIAL/PLATELET
BASOS PCT: 0 %
Basophils Absolute: 0 10*3/uL (ref 0.0–0.1)
Eosinophils Absolute: 0.2 10*3/uL (ref 0.0–0.7)
Eosinophils Relative: 2 %
HEMATOCRIT: 35.2 % — AB (ref 36.0–46.0)
HEMOGLOBIN: 11.6 g/dL — AB (ref 12.0–15.0)
LYMPHS PCT: 26 %
Lymphs Abs: 2.6 10*3/uL (ref 0.7–4.0)
MCH: 26.3 pg (ref 26.0–34.0)
MCHC: 33 g/dL (ref 30.0–36.0)
MCV: 79.8 fL (ref 78.0–100.0)
MONO ABS: 0.8 10*3/uL (ref 0.1–1.0)
Monocytes Relative: 9 %
NEUTROS PCT: 63 %
Neutro Abs: 6.2 10*3/uL (ref 1.7–7.7)
Platelets: 289 10*3/uL (ref 150–400)
RBC: 4.41 MIL/uL (ref 3.87–5.11)
RDW: 14 % (ref 11.5–15.5)
WBC: 9.9 10*3/uL (ref 4.0–10.5)

## 2017-03-02 LAB — COMPREHENSIVE METABOLIC PANEL
ALT: 46 U/L (ref 14–54)
ANION GAP: 7 (ref 5–15)
AST: 56 U/L — ABNORMAL HIGH (ref 15–41)
Albumin: 3.8 g/dL (ref 3.5–5.0)
Alkaline Phosphatase: 80 U/L (ref 38–126)
BILIRUBIN TOTAL: 0.4 mg/dL (ref 0.3–1.2)
BUN: 22 mg/dL — AB (ref 6–20)
CHLORIDE: 106 mmol/L (ref 101–111)
CO2: 26 mmol/L (ref 22–32)
Calcium: 9.1 mg/dL (ref 8.9–10.3)
Creatinine, Ser: 0.96 mg/dL (ref 0.44–1.00)
GFR calc Af Amer: >60 mL/min (ref >60)
Glucose, Bld: 88 mg/dL (ref 65–99)
POTASSIUM: 3.8 mmol/L (ref 3.5–5.1)
Sodium: 139 mmol/L (ref 135–145)
TOTAL PROTEIN: 7.6 g/dL (ref 6.5–8.1)

## 2017-03-02 LAB — I-STAT BETA HCG BLOOD, ED (MC, WL, AP ONLY): I-stat hCG, quantitative: <5 m[IU]/mL (ref <5)

## 2017-03-02 LAB — ETHANOL

## 2017-03-02 MED ORDER — SODIUM CHLORIDE 0.9 % IV BOLUS (SEPSIS)
1000.0000 mL | Freq: Once | INTRAVENOUS | Status: AC
  Administered 2017-03-02: 1000 mL via INTRAVENOUS

## 2017-03-02 MED ORDER — DIPHENHYDRAMINE HCL 50 MG/ML IJ SOLN: 50.0000 mg | Freq: Once | INTRAMUSCULAR | Status: DC

## 2017-03-02 MED ORDER — DIPHENHYDRAMINE HCL 50 MG/ML IJ SOLN
50.0000 mg | Freq: Once | INTRAMUSCULAR | Status: AC
  Administered 2017-03-02: 50 mg via INTRAVENOUS
  Filled 2017-03-02: qty 1

## 2017-03-02 NOTE — ED Triage Notes (Signed)
Pt comes in [employer], drug problem in police custody. Pt is restless and confused

## 2017-03-02 NOTE — ED Notes (Signed)
Bed: WA18 Expected date:  Expected time:  Means of arrival:  Comments: [employer] 

## 2017-03-03 ENCOUNTER — Emergency Department (HOSPITAL_COMMUNITY): Payer: Medicare Other

## 2017-03-03 DIAGNOSIS — F11220 Opioid dependence with intoxication, uncomplicated: Secondary | ICD-10-CM | POA: Diagnosis not present

## 2017-03-03 NOTE — ED Notes (Signed)
Care assumed from Dr. Erma HeritageIsaacs at shift change. Patient brought here by authorities for evaluation of erratic behavior and intoxication on illicit drugs. She was observed overnight and reevaluated. She is now awake, alert, and ambulatory. I see no indication for admission or further workup. She will be discharged, to follow-up as needed.   Tracy Lyonsouglas Yiselle Babich, MD 03/03/17 [id number]

## 2017-03-03 NOTE — ED Provider Notes (Signed)
[facility]-EMERGENCY DEPT Provider Note   CSN: [id number] Arrival date & time: 03/02/17  2102     History   Chief Complaint Chief Complaint  Patient presents with  . Medical Clearance    HPI [name] is a 36 y.o. female.  HPI    34 old female with past medical history as below who presents for medical clearance. Patient arrives via police department. According to the report, she was found acutely intoxicated and altered. She is unable to provide history. She reports tardive dyskinesia but is noted to stop on request. She denies drug use but has an extensive history of drug use.  Level 5 caveat invoked as remainder of history, ROS, and physical exam limited due to patient's intoxication.   Past Medical History:  Diagnosis Date  . ADHD (attention deficit hyperactivity disorder)   . Anxiety   . Borderline systolic HTN   . Depression   . Heroin abuse   . Hypertension   . Kidney stone   . Knee pain, bilateral    - patellofemoral syndrome, followed by Dr. [name] at [facility]  . Migraines   . Polysubstance abuse    - Hx ETOH, cocaine, THC, IV heroin, narcotics  . Psychiatric care    Previous [facility] H&P notes PMHx Schizophrenia, though patient denies this.  . Seizures [facility])     Patient Active Problem List   Diagnosis Date Noted  . Persistent recurrent vomiting 08/26/2013  . Dehydration 08/26/2013  . Acute gastroenteritis 08/26/2013  . Unspecified essential hypertension 08/26/2013  . Right knee pain 06/27/2013  . URI (upper respiratory infection) 12/02/2012  . Carpal tunnel syndrome, bilateral 04/21/2011  . Vaginitis and vulvovaginitis 04/21/2011  . ANXIETY DEPRESSION 08/20/2010  . SUBSTANCE ABUSE, MULTIPLE 08/20/2010  . ADHD 08/20/2010  . UTI 08/20/2010  . 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 DYSURIA 08/20/2010  . ELEVATED BLOOD PRESSURE WITHOUT DIAGNOSIS OF HYPERTENSION 08/20/2010    Past Surgical History:  Procedure Laterality Date  . KNEE SURGERY    .  SHOULDER SURGERY      OB History    No data available       Home Medications    Prior to Admission medications   Medication Sig Start Date End Date Taking? Authorizing Provider  ALPRAZolam Prudy Feeler) 1 MG tablet Take 1 mg by mouth at bedtime as needed for anxiety.    Historical Provider, MD  amphetamine-dextroamphetamine (ADDERALL XR) 20 MG 24 hr capsule Take 20 mg by mouth daily.    Historical Provider, MD  CarBAMazepine (TEGRETOL PO) Take 50 mg by mouth at bedtime.    Historical Provider, MD  cephALEXin (KEFLEX) 500 MG capsule Take 1 capsule (500 mg total) by mouth 3 (three) times daily. 04/30/16   Joycie Peek, PA-C  gabapentin (NEURONTIN) 300 MG capsule Take 300 mg by mouth 4 (four) times daily.    Historical Provider, MD  HYDROcodone-acetaminophen (NORCO/VICODIN) 5-325 MG tablet Take 2 tablets by mouth every 4 (four) hours as needed. 04/30/16   Joycie Peek, PA-C  ondansetron (ZOFRAN) 4 MG tablet Take 1 tablet (4 mg total) by mouth every 6 (six) hours. 04/30/16   Joycie Peek, PA-C    Family History No family history on file.  Social History Social History  Substance Use Topics  . Smoking status: Current Every Day Smoker    Packs/day: 1.00    Types: Cigarettes  . Smokeless tobacco: Never Used  . Alcohol use No     Allergies   Shellfish allergy   Review  of Systems Review of Systems  Unable to perform ROS: Mental status change     Physical Exam Updated Vital Signs BP 131/89 (BP Location: Right Arm)   Pulse 74   Temp 99 F (37.2 C) (Oral)   Resp 18   SpO2 98%   Physical Exam  Constitutional: She appears well-developed and well-nourished. No distress.  Disheveled, unkempt. Intoxicated.  HENT:  Head: Normocephalic and atraumatic.  Healing bruise under right eye. No proptosis. No other signs of head trauma.  Eyes: Conjunctivae are normal.  Neck: Neck supple.  Cardiovascular: Normal rate, regular rhythm and normal heart sounds.  Exam reveals no  friction rub.   No murmur heard. Pulmonary/Chest: Effort normal and breath sounds normal. No respiratory distress. She has no wheezes. She has no rales.  Abdominal: She exhibits no distension.  Musculoskeletal: She exhibits no edema.  Neurological: She is alert. She exhibits normal muscle tone.  Slurred speech but face symmetric. Shaking head back and forth but able to stop on command. Tongue midline. MAE with 5/5 strength. Endorses normal sensation to light touch b/l UE and LE.  Skin: Skin is warm. Capillary refill takes less than 2 seconds.  Psychiatric: She has a normal mood and affect.  Nursing note and vitals reviewed.    ED Treatments / Results  Labs (all labs ordered are listed, but only abnormal results are displayed) Labs Reviewed  COMPREHENSIVE METABOLIC PANEL - Abnormal; Notable for the following:       Result Value   BUN 22 (*)    AST 56 (*)    All other components within normal limits  CBC WITH DIFFERENTIAL/PLATELET - Abnormal; Notable for the following:    Hemoglobin 11.6 (*)    HCT 35.2 (*)    All other components within normal limits  RAPID URINE DRUG SCREEN, HOSP PERFORMED - Abnormal; Notable for the following:    Opiates POSITIVE (*)    Cocaine POSITIVE (*)    Amphetamines POSITIVE (*)    Barbiturates POSITIVE (*)    All other components within normal limits  ETHANOL  I-STAT BETA HCG BLOOD, ED ([facility], [facility], [facility] ONLY)    EKG  EKG Interpretation None       Radiology No results found.  Procedures Procedures (including critical care time)  Medications Ordered in ED Medications  diphenhydrAMINE (BENADRYL) injection 50 mg (50 mg Intravenous Given 03/02/17 2258)  sodium chloride 0.9 % bolus 1,000 mL (0 mLs Intravenous Stopped 03/03/17 0008)     Initial Impression / Assessment and Plan / ED Course  I have reviewed the triage vital signs and the nursing notes.  Pertinent labs & imaging results that were available during my care of the patient were reviewed  by me and considered in my medical decision making (see chart for details).    [age] old female with past medical history as above who presents with acute intoxication. On arrival, patient is markedly intoxicated. Screening lab work obtained and is remarkable for positive opiates, cocaine, amphetamines, and barbiturates. She is unable to provide further history at this time due to her intoxication. Will allow her to metabolize in the emergency Department. Otherwise, no apparent emergent etiology identified.  Final Clinical Impressions(s) / ED Diagnoses   Final diagnoses:  Polysubstance abuse      Shaune Pollackameron Judea Fennimore, MD 03/03/17 1328

## 2017-06-16 ENCOUNTER — Emergency Department (HOSPITAL_COMMUNITY)
Admission: EM | Admit: 2017-06-16 | Discharge: 2017-06-16 | Disposition: A | Discharge status: Home / Self Care | Payer: Medicare Other

## 2017-06-16 NOTE — ED Notes (Signed)
Bed: [facility] Expected date:  Expected time:  Means of arrival:  Comments: 

## 2017-06-16 NOTE — ED Notes (Signed)
Called pt for triage, pt not in lobby 

## 2017-06-16 NOTE — ED Notes (Signed)
Pt called all areas of lobby x 3, not present in lobby, presumed to have left.

## 2017-06-26 ENCOUNTER — Encounter (HOSPITAL_COMMUNITY): Payer: Self-pay | Admitting: *Deleted

## 2017-06-26 ENCOUNTER — Emergency Department (HOSPITAL_COMMUNITY)
Admission: EM | Admit: 2017-06-26 | Discharge: 2017-06-26 | Discharge status: Absent without leave | Payer: Medicare Other | Source: Home / Self Care

## 2017-06-26 ENCOUNTER — Emergency Department (HOSPITAL_COMMUNITY)
Admission: EM | Admit: 2017-06-26 | Discharge: 2017-06-27 | Disposition: A | Discharge status: Home / Self Care | Payer: Medicare Other | Attending: Emergency Medicine | Admitting: Emergency Medicine

## 2017-06-26 DIAGNOSIS — F909 Attention-deficit hyperactivity disorder, unspecified type: Secondary | ICD-10-CM | POA: Diagnosis not present

## 2017-06-26 DIAGNOSIS — F17210 Nicotine dependence, cigarettes, uncomplicated: Secondary | ICD-10-CM | POA: Diagnosis not present

## 2017-06-26 DIAGNOSIS — F1994 Other psychoactive substance use, unspecified with psychoactive substance-induced mood disorder: Secondary | ICD-10-CM | POA: Diagnosis not present

## 2017-06-26 DIAGNOSIS — F419 Anxiety disorder, unspecified: Secondary | ICD-10-CM

## 2017-06-26 DIAGNOSIS — F1920 Other psychoactive substance dependence, uncomplicated: Secondary | ICD-10-CM

## 2017-06-26 DIAGNOSIS — F1120 Opioid dependence, uncomplicated: Secondary | ICD-10-CM | POA: Diagnosis not present

## 2017-06-26 DIAGNOSIS — Z79899 Other long term (current) drug therapy: Secondary | ICD-10-CM | POA: Diagnosis not present

## 2017-06-26 DIAGNOSIS — I10 Essential (primary) hypertension: Secondary | ICD-10-CM | POA: Insufficient documentation
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
## 2017-06-27 ENCOUNTER — Encounter (HOSPITAL_COMMUNITY): Payer: Self-pay | Admitting: Registered Nurse

## 2017-06-27 DIAGNOSIS — F1120 Opioid dependence, uncomplicated: Secondary | ICD-10-CM

## 2017-06-27 DIAGNOSIS — F17210 Nicotine dependence, cigarettes, uncomplicated: Secondary | ICD-10-CM

## 2017-06-27 DIAGNOSIS — F419 Anxiety disorder, unspecified: Secondary | ICD-10-CM | POA: Diagnosis not present

## 2017-06-27 DIAGNOSIS — F1994 Other psychoactive substance use, unspecified with psychoactive substance-induced mood disorder: Secondary | ICD-10-CM | POA: Diagnosis not present

## 2017-06-27 DIAGNOSIS — F1920 Other psychoactive substance dependence, uncomplicated: Secondary | ICD-10-CM

## 2017-06-27 LAB — COMPREHENSIVE METABOLIC PANEL
ALBUMIN: 3.3 g/dL — AB (ref 3.5–5.0)
ALT: 33 U/L (ref 14–54)
ANION GAP: 10 (ref 5–15)
AST: 39 U/L (ref 15–41)
Alkaline Phosphatase: 65 U/L (ref 38–126)
BILIRUBIN TOTAL: 0.3 mg/dL (ref 0.3–1.2)
BUN: 24 mg/dL — AB (ref 6–20)
CALCIUM: 8.5 mg/dL — AB (ref 8.9–10.3)
CO2: 23 mmol/L (ref 22–32)
Chloride: 105 mmol/L (ref 101–111)
Creatinine, Ser: 1.11 mg/dL — ABNORMAL HIGH (ref 0.44–1.00)
GFR calc Af Amer: >60 mL/min (ref >60)
GLUCOSE: 130 mg/dL — AB (ref 65–99)
POTASSIUM: 3.5 mmol/L (ref 3.5–5.1)
Sodium: 138 mmol/L (ref 135–145)
TOTAL PROTEIN: 6.5 g/dL (ref 6.5–8.1)

## 2017-06-27 LAB — CBC WITH DIFFERENTIAL/PLATELET
BASOS PCT: 2 %
Basophils Absolute: 0.1 10*3/uL (ref 0.0–0.1)
Eosinophils Absolute: 0.4 10*3/uL (ref 0.0–0.7)
Eosinophils Relative: 5 %
HEMATOCRIT: 35.6 % — AB (ref 36.0–46.0)
HEMOGLOBIN: 11.9 g/dL — AB (ref 12.0–15.0)
LYMPHS ABS: 2.9 10*3/uL (ref 0.7–4.0)
LYMPHS PCT: 36 %
MCH: 27.9 pg (ref 26.0–34.0)
MCHC: 33.4 g/dL (ref 30.0–36.0)
MCV: 83.4 fL (ref 78.0–100.0)
MONO ABS: 0.5 10*3/uL (ref 0.1–1.0)
MONOS PCT: 6 %
NEUTROS ABS: 4.1 10*3/uL (ref 1.7–7.7)
NEUTROS PCT: 51 %
Platelets: 342 10*3/uL (ref 150–400)
RBC: 4.27 MIL/uL (ref 3.87–5.11)
RDW: 13.7 % (ref 11.5–15.5)
WBC: 8 10*3/uL (ref 4.0–10.5)

## 2017-06-27 LAB — RAPID URINE DRUG SCREEN, HOSP PERFORMED
Amphetamines: POSITIVE — AB
BENZODIAZEPINES: NOT DETECTED
Barbiturates: NOT DETECTED
Cocaine: NOT DETECTED
Opiates: POSITIVE — AB
Tetrahydrocannabinol: NOT DETECTED

## 2017-06-27 LAB — ACETAMINOPHEN LEVEL

## 2017-06-27 LAB — SALICYLATE LEVEL

## 2017-06-27 LAB — ETHANOL

## 2017-06-27 LAB — PREGNANCY, URINE: PREG TEST UR: NEGATIVE

## 2017-06-27 MED ORDER — GABAPENTIN 300 MG PO CAPS: 300.0000 mg | ORAL_CAPSULE | Freq: Four times a day (QID) | ORAL | 0 refills | Status: DC

## 2017-06-27 MED ORDER — CARBAMAZEPINE 100 MG/5ML PO SUSP: 50.0000 mg | Freq: Every day | ORAL | Status: DC

## 2017-06-27 MED ORDER — CARBAMAZEPINE ER 100 MG PO TB12: ORAL_TABLET | ORAL | Status: DC

## 2017-06-27 MED ORDER — GABAPENTIN 300 MG PO CAPS
300.0000 mg | ORAL_CAPSULE | Freq: Four times a day (QID) | ORAL | Status: DC
  Administered 2017-06-27 (×2): 300 mg via ORAL
  Filled 2017-06-27 (×2): qty 1

## 2017-06-27 NOTE — Consult Note (Signed)
[facility] Consult   Reason for Consult:  Worsening depression Referring Physician:  EDP Patient Identification: [name] MRN:  [id number] Principal Diagnosis: Substance induced mood disorder ([place]) Diagnosis:   Patient Active Problem List   Diagnosis Date Noted  . Polysubstance dependence including opioid type drug, episodic abuse ([place]) [F11.20, F19.20] 06/27/2017  . Substance induced mood disorder ([place]) [F19.94] 06/27/2017  . Persistent recurrent vomiting [R11.10] 08/26/2013  . Dehydration [E86.0] 08/26/2013  . Acute gastroenteritis [K52.9] 08/26/2013  . Unspecified essential hypertension [I10] 08/26/2013  . Right knee pain [M25.561] 06/27/2013  . URI (upper respiratory infection) [J06.9] 12/02/2012  . Carpal tunnel syndrome, bilateral [G56.03] 04/21/2011  . Vaginitis and vulvovaginitis [616.1] 04/21/2011  . ANXIETY DEPRESSION [F34.1] 08/20/2010  . SUBSTANCE ABUSE, MULTIPLE [F19.10] 08/20/2010  . ADHD [F90.9] 08/20/2010  . UTI [N39.0] 08/20/2010  . 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 DYSURIA [R30.0] 08/20/2010  . ELEVATED BLOOD PRESSURE WITHOUT DIAGNOSIS OF HYPERTENSION [R03.0] 08/20/2010    Total Time spent with patient: 45 minutes  Subjective:   Magaby Rumberger Nappier is a 36 y.o. female patient presents to [facility] requesting help with substance abuse.  HPI:  patient seen by Dr. Louretta Shorten and this provider.  Chart reviewed and face to face evaluation on 06/27/17.   On evaluation:  Gursimran Litaker Zeidan reports that she has a prior history of Bipolar and was being treated with Xanax and Neurontin.  Also states that she uses Heroin daily.  Out patient services from [facility].  States that she has been off of her medication for a year and wants to get back on her medications and wants some assistance with substance abuse.  At this time patient denies suicidal/homicidal ideation, psychosis, and paranoia.     Past Psychiatric History: Bipolar Disorder and  Polysubstance abuse  Risk to Self: Suicidal Ideation: No (Patient denies.) Suicidal Intent: No Is patient at risk for suicide?: No Suicidal Plan?: No Access to Means: No What has been your use of drugs/alcohol within the last 12 months?: Heroin How many times?: 0 Other Self Harm Risks: Patient denies. Triggers for Past Attempts: None known Intentional Self Injurious Behavior: None (Patient denies.) Risk to Others: Homicidal Ideation: No (Patient denies.) Thoughts of Harm to Others: No Current Homicidal Intent: No Current Homicidal Plan: No Access to Homicidal Means: No (Patient denies.) Identified Victim: Patient denies. History of harm to others?: No Assessment of Violence: On admission Violent Behavior Description: Patient denies. Does patient have access to weapons?: No (Patient denies.) Criminal Charges Pending?: Yes Describe Pending Criminal Charges: Pt. reports pending charges from check fraud Does patient have a court date: Yes Court Date:  (Pt. reported being unsure of court date) Prior Inpatient Therapy: Prior Inpatient Therapy: Yes Prior Therapy Dates: 2011, 2015 Prior Therapy Facilty/Provider(s): [facility] Reason for Treatment: Substance Abuse Prior Outpatient Therapy: Prior Outpatient Therapy: No Prior Therapy Dates: None Prior Therapy Facilty/Provider(s): None Reason for Treatment: None Does patient have an ACCT team?: No Does patient have Intensive In-House Services?  : No Does patient have [facility] services? : No Does patient have [facility] services?: No  Past Medical History:  Past Medical History:  Diagnosis Date  . ADHD (attention deficit hyperactivity disorder)   . Anxiety   . Borderline systolic HTN   . Depression   . Heroin abuse   . Hypertension   . Kidney stone   . Knee pain, bilateral    - patellofemoral syndrome, followed by Dr. Alfonso Ramus at [facility]  .  Migraines   . Polysubstance abuse    - Hx ETOH, cocaine, THC, IV  heroin, narcotics  . Psychiatric care    Previous [facility] H&P notes PMHx Schizophrenia, though patient denies this.  . Seizures ([place])     Past Surgical History:  Procedure Laterality Date  . KNEE SURGERY    . SHOULDER SURGERY     Family History: History reviewed. No pertinent family history. Family Psychiatric  History: Denies Social History:  History  Alcohol Use No     History  Drug Use  . Types: IV    Comment: heroin    Social History   Social History  . Marital status: Single    Spouse name: N/A  . Number of children: N/A  . Years of education: N/A   Social History Main Topics  . Smoking status: Current Every Day Smoker    Packs/day: 1.00    Types: Cigarettes  . Smokeless tobacco: Never Used  . Alcohol use No  . Drug use: Yes    Types: IV     Comment: heroin  . Sexual activity: Yes    Birth control/ protection: None   Other Topics Concern  . None   Social History Narrative  . None   Additional Social History:    Allergies:   Allergies  Allergen Reactions  . Shellfish Allergy Hives    Labs:  Results for orders placed or performed during the hospital encounter of 06/26/17 (from the past 48 hour(s))  CBC with Differential     Status: Abnormal   Collection Time: 06/26/17  1:00 AM  Result Value Ref Range   WBC 8.0 4.0 - 10.5 K/uL   RBC 4.27 3.87 - 5.11 MIL/uL   Hemoglobin 11.9 (L) 12.0 - 15.0 g/dL   HCT 35.6 (L) 36.0 - 46.0 %   MCV 83.4 78.0 - 100.0 fL   MCH 27.9 26.0 - 34.0 pg   MCHC 33.4 30.0 - 36.0 g/dL   RDW 13.7 11.5 - 15.5 %   Platelets 342 150 - 400 K/uL   Neutrophils Relative % 51 %   Neutro Abs 4.1 1.7 - 7.7 K/uL   Lymphocytes Relative 36 %   Lymphs Abs 2.9 0.7 - 4.0 K/uL   Monocytes Relative 6 %   Monocytes Absolute 0.5 0.1 - 1.0 K/uL   Eosinophils Relative 5 %   Eosinophils Absolute 0.4 0.0 - 0.7 K/uL   Basophils Relative 2 %   Basophils Absolute 0.1 0.0 - 0.1 K/uL  Comprehensive metabolic panel     Status: Abnormal   Collection  Time: 06/26/17  1:00 AM  Result Value Ref Range   Sodium 138 135 - 145 mmol/L   Potassium 3.5 3.5 - 5.1 mmol/L   Chloride 105 101 - 111 mmol/L   CO2 23 22 - 32 mmol/L   Glucose, Bld 130 (H) 65 - 99 mg/dL   BUN 24 (H) 6 - 20 mg/dL   Creatinine, Ser 1.11 (H) 0.44 - 1.00 mg/dL   Calcium 8.5 (L) 8.9 - 10.3 mg/dL   Total Protein 6.5 6.5 - 8.1 g/dL   Albumin 3.3 (L) 3.5 - 5.0 g/dL   AST 39 15 - 41 U/L   ALT 33 14 - 54 U/L   Alkaline Phosphatase 65 38 - 126 U/L   Total Bilirubin 0.3 0.3 - 1.2 mg/dL   GFR calc non Af Amer >60 >60 mL/min   GFR calc Af Amer >60 >60 mL/min    Comment: (NOTE) The eGFR  has been calculated using the CKD EPI equation. This calculation has not been validated in all clinical situations. eGFR's persistently <60 mL/min signify possible Chronic Kidney Disease.    Anion gap 10 5 - 15  Rapid urine drug screen (hospital performed)     Status: Abnormal   Collection Time: 06/27/17  4:00 AM  Result Value Ref Range   Opiates POSITIVE (A) NONE DETECTED   Cocaine NONE DETECTED NONE DETECTED   Benzodiazepines NONE DETECTED NONE DETECTED   Amphetamines POSITIVE (A) NONE DETECTED   Tetrahydrocannabinol NONE DETECTED NONE DETECTED   Barbiturates NONE DETECTED NONE DETECTED    Comment:        DRUG SCREEN FOR MEDICAL PURPOSES ONLY.  IF CONFIRMATION IS NEEDED FOR ANY PURPOSE, NOTIFY LAB WITHIN 5 DAYS.        LOWEST DETECTABLE LIMITS FOR URINE DRUG SCREEN Drug Class       Cutoff (ng/mL) Amphetamine      1000 Barbiturate      200 Benzodiazepine   952 Tricyclics       841 Opiates          300 Cocaine          300 THC              50   Pregnancy, urine     Status: None   Collection Time: 06/27/17  4:00 AM  Result Value Ref Range   Preg Test, Ur NEGATIVE NEGATIVE    Comment:        THE SENSITIVITY OF THIS METHODOLOGY IS >20 mIU/mL.   Acetaminophen level     Status: Abnormal   Collection Time: 06/27/17  4:34 AM  Result Value Ref Range   Acetaminophen (Tylenol),  Serum <10 (L) 10 - 30 ug/mL    Comment:        THERAPEUTIC CONCENTRATIONS VARY SIGNIFICANTLY. A RANGE OF 10-30 ug/mL MAY BE AN EFFECTIVE CONCENTRATION FOR MANY PATIENTS. HOWEVER, SOME ARE BEST TREATED AT CONCENTRATIONS OUTSIDE THIS RANGE. ACETAMINOPHEN CONCENTRATIONS >150 ug/mL AT 4 HOURS AFTER INGESTION AND >50 ug/mL AT 12 HOURS AFTER INGESTION ARE OFTEN ASSOCIATED WITH TOXIC REACTIONS.   Ethanol     Status: None   Collection Time: 06/27/17  4:34 AM  Result Value Ref Range   Alcohol, Ethyl (B) <5 <5 mg/dL    Comment:        LOWEST DETECTABLE LIMIT FOR SERUM ALCOHOL IS 5 mg/dL FOR MEDICAL PURPOSES ONLY   Salicylate level     Status: None   Collection Time: 06/27/17  4:34 AM  Result Value Ref Range   Salicylate Lvl <3.2 2.8 - 30.0 mg/dL    Current Facility-Administered Medications  Medication Dose Route Frequency Provider Last Rate Last Dose  . carBAMazepine (TEGRETOL) 100 MG/5ML suspension 50 mg  50 mg Oral QHS Humes, Kelly, PA-C      . gabapentin (NEURONTIN) capsule 300 mg  300 mg Oral QID Antonietta Breach, PA-C   300 mg at 06/27/17 1432   Current Outpatient Prescriptions  Medication Sig Dispense Refill  . carbamazepine (TEGRETOL XR) 100 MG 12 hr tablet Take 100 mg by mouth 2 (two) times daily.    Marland Kitchen gabapentin (NEURONTIN) 600 MG tablet Take 600 mg by mouth 3 (three) times daily.  3  . hydrOXYzine (VISTARIL) 25 MG capsule Take 25 mg by mouth 2 (two) times daily.      Musculoskeletal: Strength & Muscle Tone: within normal limits Gait & Station: normal Patient leans: N/A  Psychiatric Specialty Exam:  Physical Exam  Nursing note and vitals reviewed. Constitutional: She is oriented to person, place, and time.  Neck: Normal range of motion.  Respiratory: Effort normal.  Musculoskeletal: Normal range of motion.  Neurological: She is alert and oriented to person, place, and time.  Psychiatric: She has a normal mood and affect. Her speech is normal and behavior is normal.  Thought content normal. Cognition and memory are normal. She expresses impulsivity.    Review of Systems  Psychiatric/Behavioral: Positive for depression and substance abuse. Negative for hallucinations, memory loss and suicidal ideas. The patient is not nervous/anxious and does not have insomnia.   All other systems reviewed and are negative.   Blood pressure 124/74, pulse 73, temperature 98.1 F (36.7 C), temperature source Oral, resp. rate 16, height _0  (1.676 m), weight 63.5 kg (140 lb), last menstrual period 05/27/2017, SpO2 98 %.Body mass index is 22.6 kg/m.  General Appearance: Casual  Eye Contact:  Good  Speech:  Clear and Coherent and Normal Rate  Volume:  Normal  Mood:  "Good"  Affect:  Appropriate  Thought Process:  Coherent and Goal Directed  Orientation:  Full (Time, Place, and Person)  Thought Content:  Logical  Suicidal Thoughts:  No  Homicidal Thoughts:  No  Memory:  Immediate;   Good Recent;   Good Remote;   Good  Judgement:  Fair  Insight:  Fair  Psychomotor Activity:  Normal  Concentration:  Concentration: Good and Attention Span: Good  Recall:  Good  Fund of Knowledge:  Fair  Language:  Good  Akathisia:  No  Handed:  Right  AIMS (if indicated):     Assets:  Communication Skills Desire for Improvement Housing Social Support  ADL's:  Intact  Cognition:  WNL  Sleep:        Treatment Plan Summary: Daily contact with patient to assess and evaluate symptoms and progress in treatment, Medication management and Plan Discharge home to follow up with [facility]  Disposition: No evidence of imminent risk to self or others at present.   Patient does not meet criteria for psychiatric inpatient admission.  Rankin, Delphia Grates, NP 06/27/2017 3:22 PM   Patient seen, chart reviewed and case discussed with physician extender and formulated treatment plan. Reviewed the information documented and agree with the treatment plan.  Tyreck Bell 07/04/2017 1:17  PM
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
## 2017-06-27 NOTE — [facility] Counselor (Signed)
Per Nira ConnJason Berry, NP---[facility] Psych Evaluation recommended.  Elmore GuiseJaniah Chioma Mukherjee, LPC-A, LCAS Therapeutic Triage Specialist [phone]
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
## 2017-08-10 ENCOUNTER — Inpatient Hospital Stay (HOSPITAL_BASED_OUTPATIENT_CLINIC_OR_DEPARTMENT_OTHER)
Admission: EM | Admit: 2017-08-10 | Discharge: 2017-08-10 | DRG: 603 | Discharge status: Against Medical Advice | Payer: Medicare Other | Attending: Internal Medicine | Admitting: Internal Medicine

## 2017-08-10 ENCOUNTER — Encounter (HOSPITAL_BASED_OUTPATIENT_CLINIC_OR_DEPARTMENT_OTHER): Payer: Self-pay | Admitting: *Deleted

## 2017-08-10 ENCOUNTER — Emergency Department (HOSPITAL_BASED_OUTPATIENT_CLINIC_OR_DEPARTMENT_OTHER): Payer: Medicare Other

## 2017-08-10 DIAGNOSIS — F17210 Nicotine dependence, cigarettes, uncomplicated: Secondary | ICD-10-CM | POA: Diagnosis present

## 2017-08-10 DIAGNOSIS — I10 Essential (primary) hypertension: Secondary | ICD-10-CM | POA: Diagnosis present

## 2017-08-10 DIAGNOSIS — F419 Anxiety disorder, unspecified: Secondary | ICD-10-CM | POA: Diagnosis present

## 2017-08-10 DIAGNOSIS — L03113 Cellulitis of right upper limb: Secondary | ICD-10-CM | POA: Diagnosis present

## 2017-08-10 DIAGNOSIS — F909 Attention-deficit hyperactivity disorder, unspecified type: Secondary | ICD-10-CM | POA: Diagnosis present

## 2017-08-10 DIAGNOSIS — F1914 Other psychoactive substance abuse with psychoactive substance-induced mood disorder: Secondary | ICD-10-CM | POA: Diagnosis present

## 2017-08-10 DIAGNOSIS — Z79899 Other long term (current) drug therapy: Secondary | ICD-10-CM | POA: Diagnosis not present

## 2017-08-10 DIAGNOSIS — M7989 Other specified soft tissue disorders: Secondary | ICD-10-CM | POA: Diagnosis present

## 2017-08-10 DIAGNOSIS — F329 Major depressive disorder, single episode, unspecified: Secondary | ICD-10-CM | POA: Diagnosis present

## 2017-08-10 DIAGNOSIS — L089 Local infection of the skin and subcutaneous tissue, unspecified: Secondary | ICD-10-CM | POA: Diagnosis present

## 2017-08-10 DIAGNOSIS — Z91013 Allergy to seafood: Secondary | ICD-10-CM | POA: Diagnosis not present

## 2017-08-10 LAB — CBC WITH DIFFERENTIAL/PLATELET
Basophils Absolute: 0 10*3/uL (ref 0.0–0.1)
Basophils Relative: 0 %
EOS ABS: 0.2 10*3/uL (ref 0.0–0.7)
Eosinophils Relative: 2 %
HEMATOCRIT: 41.1 % (ref 36.0–46.0)
HEMOGLOBIN: 13.7 g/dL (ref 12.0–15.0)
LYMPHS ABS: 1.8 10*3/uL (ref 0.7–4.0)
LYMPHS PCT: 20 %
MCH: 27.8 pg (ref 26.0–34.0)
MCHC: 33.3 g/dL (ref 30.0–36.0)
MCV: 83.4 fL (ref 78.0–100.0)
MONOS PCT: 7 %
Monocytes Absolute: 0.6 10*3/uL (ref 0.1–1.0)
NEUTROS ABS: 6.1 10*3/uL (ref 1.7–7.7)
NEUTROS PCT: 71 %
Platelets: 367 10*3/uL (ref 150–400)
RBC: 4.93 MIL/uL (ref 3.87–5.11)
RDW: 14 % (ref 11.5–15.5)
WBC: 8.6 10*3/uL (ref 4.0–10.5)

## 2017-08-10 LAB — BASIC METABOLIC PANEL
ANION GAP: 9 (ref 5–15)
BUN: 19 mg/dL (ref 6–20)
CHLORIDE: 109 mmol/L (ref 101–111)
CO2: 21 mmol/L — AB (ref 22–32)
CREATININE: 0.65 mg/dL (ref 0.44–1.00)
Calcium: 9.2 mg/dL (ref 8.9–10.3)
GFR calc non Af Amer: >60 mL/min (ref >60)
Glucose, Bld: 108 mg/dL — ABNORMAL HIGH (ref 65–99)
POTASSIUM: 3.7 mmol/L (ref 3.5–5.1)
SODIUM: 139 mmol/L (ref 135–145)

## 2017-08-10 MED ORDER — LORAZEPAM 2 MG/ML IJ SOLN
2.0000 mg | Freq: Once | INTRAMUSCULAR | Status: DC
Start: 2017-08-10 — End: 2017-08-10
  Filled 2017-08-10: qty 1

## 2017-08-10 MED ORDER — ONDANSETRON HCL 4 MG/2ML IJ SOLN
4.0000 mg | Freq: Once | INTRAMUSCULAR | Status: AC
  Administered 2017-08-10: 4 mg via INTRAVENOUS
  Filled 2017-08-10 (×2): qty 2

## 2017-08-10 MED ORDER — VANCOMYCIN HCL IN DEXTROSE 1-5 GM/200ML-% IV SOLN
1000.0000 mg | Freq: Once | INTRAVENOUS | Status: AC
  Administered 2017-08-10: 1000 mg via INTRAVENOUS
  Filled 2017-08-10 (×2): qty 200

## 2017-08-10 MED ORDER — SODIUM CHLORIDE 0.9 % IV BOLUS (SEPSIS)
1000.0000 mL | Freq: Once | INTRAVENOUS | Status: AC
  Administered 2017-08-10: 1000 mL via INTRAVENOUS

## 2017-08-10 MED ORDER — CLINDAMYCIN HCL 150 MG PO CAPS: 300.0000 mg | ORAL_CAPSULE | Freq: Three times a day (TID) | ORAL | 0 refills | Status: DC

## 2017-08-10 MED ORDER — SODIUM CHLORIDE 0.9 % IV SOLN
INTRAVENOUS | Status: DC
  Administered 2017-08-10: 15:00:00 via INTRAVENOUS

## 2017-08-10 MED FILL — CLINDAMYCIN HCL 150 MG CAPS: 150 | 10 days supply | Qty: 60 | Fill #0

## 2017-08-10 NOTE — ED Notes (Signed)
Patient on 5 lead, vitals set for Q 30 minutes.

## 2017-08-10 NOTE — ED Notes (Signed)
Patient stated that she don't want to be admitted.  Father as at bedside during this conversation.

## 2017-08-10 NOTE — ED Provider Notes (Cosign Needed Addendum)
[facility] Note   CSN: [id number] Arrival date & time: 08/10/17  1059     History   Chief Complaint Chief Complaint  Patient presents with  . Arm Swelling    HPI [name] is a 36 y.o. female.  History of Present Illness  Patient Identification [name] is a 36 y.o. female.  Patient presented to the Emergency Department by private vehicle.  Chief Complaint  Arm Swelling   Patient presents for evaluation of a possible skin infection. Onset of symptoms was abrupt starting 4 days ago, with gradually worsening symptoms since that time. Symptoms include severe pain and erythema located Over the right Hand, wrist and forearm. Patient reports chills. There is not a history of trauma to the area. Patient admits to IVDU.     The history is provided by the patient and medical records. No language interpreter was used.  Hand Pain  This is a new problem. The current episode started more than 2 days ago. The problem occurs constantly. The problem has been gradually worsening. Pertinent negatives include no chest pain, no abdominal pain, no headaches and no shortness of breath. Exacerbated by: movement and touch. Nothing relieves the symptoms. The treatment provided no relief.    Past Medical History:  Diagnosis Date  . ADHD (attention deficit hyperactivity disorder)   . Anxiety   . Borderline systolic HTN   . Depression   . Heroin abuse   . Hypertension   . Kidney stone   . Knee pain, bilateral    - patellofemoral syndrome, followed by Dr. [name] at [facility]  . Migraines   . Polysubstance abuse    - Hx ETOH, cocaine, THC, IV heroin, narcotics  . Psychiatric care    Previous BH H&P notes PMHx Schizophrenia, though patient denies this.  . Seizures [facility])     Patient Active Problem List   Diagnosis Date Noted  . Polysubstance dependence including opioid type drug, episodic abuse (HCC) 06/27/2017  . Substance induced mood disorder (HCC)  06/27/2017  . Anxiety   . Persistent recurrent vomiting 08/26/2013  . Dehydration 08/26/2013  . Acute gastroenteritis 08/26/2013  . Unspecified essential hypertension 08/26/2013  . Right knee pain 06/27/2013  . URI (upper respiratory infection) 12/02/2012  . Carpal tunnel syndrome, bilateral 04/21/2011  . Vaginitis and vulvovaginitis 04/21/2011  . ANXIETY DEPRESSION 08/20/2010  . SUBSTANCE ABUSE, MULTIPLE 08/20/2010  . ADHD 08/20/2010  . UTI 08/20/2010  . 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 DYSURIA 08/20/2010  . ELEVATED BLOOD PRESSURE WITHOUT DIAGNOSIS OF HYPERTENSION 08/20/2010    Past Surgical History:  Procedure Laterality Date  . KNEE SURGERY    . SHOULDER SURGERY      OB History    No data available       Home Medications    Prior to Admission medications   Medication Sig Start Date End Date Taking? Authorizing Provider  amphetamine-dextroamphetamine (ADDERALL XR) 30 MG 24 hr capsule Take 30 mg by mouth daily.   Yes [provider]  gabapentin (NEURONTIN) 600 MG tablet Take 600 mg by mouth 3 (three) times daily. 06/08/17  Yes [provider]  hydrOXYzine (VISTARIL) 25 MG capsule Take 25 mg by mouth 2 (two) times daily. 06/09/17  Yes [provider]  carbamazepine (TEGRETOL XR) 100 MG 12 hr tablet Take half tablet (50 mg) daily at bed time 06/27/17   Rankin, Shuvon B, NP  gabapentin (NEURONTIN) 300 MG capsule Take 1 capsule (300 mg total) by mouth  4 (four) times daily. 06/27/17   Rankin, Shuvon B, NP    Family History No family history on file.  Social History Social History  Substance Use Topics  . Smoking status: Current Every Day Smoker    Packs/day: 1.00    Types: Cigarettes  . Smokeless tobacco: Never Used  . Alcohol use No     Allergies   Shellfish allergy   Review of Systems Review of Systems  Constitutional: Positive for chills.  HENT: Negative.   Eyes: Negative.   Respiratory: Negative for shortness of breath.     Cardiovascular: Negative for chest pain.  Gastrointestinal: Negative for abdominal pain.  Endocrine: Negative.   Genitourinary: Negative.   Musculoskeletal: Negative.   Skin: Positive for color change.  Neurological: Negative for headaches.  Hematological: Negative.   All other systems reviewed and are negative.    Physical Exam Updated Vital Signs BP 121/73 (BP Location: Left Arm)   Pulse 79   Temp 98.3 F (36.8 C) (Oral)   Resp 18   Ht 5\' 6"  (1.676 m)   Wt 72.6 kg (160 lb)   LMP 08/07/2017 (Approximate)   SpO2 99%   BMI 25.82 kg/m   Physical Exam  Constitutional: She is oriented to person, place, and time. She appears well-developed and well-nourished. No distress.  HENT:  Head: Normocephalic and atraumatic.  Eyes: Conjunctivae are normal. No scleral icterus.  Neck: Normal range of motion.  Cardiovascular: Normal rate, regular rhythm and normal heart sounds.  Exam reveals no gallop and no friction rub.   No murmur heard. Pulmonary/Chest: Effort normal and breath sounds normal. No respiratory distress.  Abdominal: Soft. Bowel sounds are normal. She exhibits no distension and no mass. There is no tenderness. There is no guarding.  Musculoskeletal:  Right hand, wrist and forearm with erythema, streaking toward the antecubital fossa. Erythema is mostly localized around the wrist with swelling into the hand. Patient is able to make a strong fist and abductor fingers. Tenderness is localized around the wrist predominantly. Strong pulses and neurovascularly intact  Neurological: She is alert and oriented to person, place, and time.  Skin: Skin is warm and dry. She is not diaphoretic.  Psychiatric: Her behavior is normal.  Nursing note and vitals reviewed.        ED Treatments / Results  Labs (all labs ordered are listed, but only abnormal results are displayed) Labs Reviewed  CULTURE, BLOOD (ROUTINE X 2)  BASIC METABOLIC PANEL  CBC WITH DIFFERENTIAL/PLATELET   PREGNANCY, URINE    EKG  EKG Interpretation None       Radiology No results found.  Procedures Procedures (including critical care time)  Medications Ordered in ED Medications  sodium chloride 0.9 % bolus 1,000 mL (not administered)    And  0.9 %  sodium chloride infusion (not administered)  vancomycin (VANCOCIN) IVPB 1000 mg/200 mL premix (not administered)  ondansetron (ZOFRAN) injection 4 mg (not administered)     Initial Impression / Assessment and Plan / ED Course  I have reviewed the triage vital signs and the nursing notes.  Pertinent labs & imaging results that were available during my care of the patient were reviewed by me and considered in my medical decision making (see chart for details).     36 year old female with history of mental illness, IV drug abuse. Patient with right hand cellulitis. Her labs are otherwise unremarkable. There was some delay in care secondary to difficulty accessing the patient's vasculature for blood draw and administration  of medications. The patient will be admitted to [facility]. She started on vancomycin. Blood culture taken. She agrees with admission.    3:42 PM After admission and agreement by the patient for admission. Patient now is declining admission. I have discussed leaving AMA with the patient including going over risks and benefits which may include death, loss of limb, worsening condition. Patient will be discharged with clindamycin. I discussed with the patient reasons to seek immediate medical care and had been advised her to please return at any time should she wish for admission.  Date: 08/10/2017 Patient: [name] Admitted: 08/10/2017 11:07 AM Attending Provider: Myles Rosenthal Prusinski or her authorized caregiver has made the decision for the patient to leave the emergency department against the advice of Chaley Castellanos,PA-C.  She or her authorized caregiver has been informed  and understands the inherent risks, including death.  She or her authorized caregiver has decided to accept the responsibility for this decision. Ivorie Uplinger Nickey and all necessary parties have been advised that she may return for further evaluation or treatment. Her condition at time of discharge was Stable.  Garth Schlatter Cardarelli had current vital signs as follows:  Blood pressure 125/88, pulse 93, temperature 98.3 F (36.8 C), temperature source Oral, resp. rate 19, height 5\' 6"  (1.676 m), weight 72.6 kg (160 lb), last menstrual period 08/07/2017, SpO2 100 %.   Garth Schlatter Bubel or her authorized caregiver has signed the Leaving Against Medical Advice form prior to leaving the department.  Arthor Captain 08/10/2017     Final Clinical Impressions(s) / ED Diagnoses   Final diagnoses:  Cellulitis of right hand    New Prescriptions New Prescriptions   No medications on file     Delos Haring 08/10/17 1533    Jacalyn Lefevre, MD 08/10/17 1538    Tiburcio Pea, Alexander, PA-C 08/10/17 [phone]

## 2017-08-10 NOTE — Discharge Instructions (Signed)
Contact a health care provider if: °You have a fever. °Your symptoms do not improve within 1-2 days of starting treatment. °Your bone or joint underneath the infected area becomes painful after the skin has healed. °Your infection returns in the same area or another area. °You notice a swollen bump in the infected area. °You develop new symptoms. °You have a general ill feeling (malaise) with muscle aches and pains. °Get help right away if: °Your symptoms get worse. °You feel very sleepy. °You develop vomiting or diarrhea that persists. °You notice red streaks coming from the infected area. °Your red area gets larger or turns dark in color. °

## 2017-08-10 NOTE — Progress Notes (Addendum)
36 year old female with known polysubstance use including IVDU with heroin presented to [facility] complaining of pain in her arm. She is noted to havecellulitis of the arm without evidence of abscess per report. She is willing to be admitted for IV antibiotics for her cellulitis. Vancomycin has been started. She is hemodynamically stable.  After review of pictures in ED note, I note that patient may have deep soft tissue infection of hand. Consult to hand surgeon may or may not be warranted upon arrival.

## 2017-08-10 NOTE — ED Triage Notes (Signed)
Pt reports R hand swelling, redness and pain since this past Friday. Pt reports she currently uses IV drugs and thinks she may have injected in that hand recently. Denies fever, n/v.

## 2017-08-10 NOTE — ED Notes (Signed)
Unsuccessful IV attempts x2 -- blood return on initial stick (L AC), but unable to draw enough blood for labs and site infiltrated. On 2nd stick, pt pulled arm away after skin puncture, which dislodged the needle and catheter (L ant wrist). PA-C made aware.

## 2017-08-10 NOTE — ED Notes (Signed)
Patient is leaving against medical advise.  During this conversation, father is at  Bedside.  Patient does not want to be admitted.  Discharge instructions and prescription for Clindamycin is given.  Patient and father verbalized understanding.

## 2017-08-10 NOTE — ED Notes (Signed)
ED Provider at bedside for ultrasound IV attempt.

## 2017-08-15 LAB — CULTURE, BLOOD (ROUTINE X 2)
Culture: NO GROWTH
Special Requests: ADEQUATE

## 2017-08-31 DIAGNOSIS — R200 Anesthesia of skin: Secondary | ICD-10-CM | POA: Insufficient documentation

## 2017-10-16 ENCOUNTER — Encounter (HOSPITAL_BASED_OUTPATIENT_CLINIC_OR_DEPARTMENT_OTHER): Payer: Self-pay | Admitting: Emergency Medicine

## 2017-10-16 ENCOUNTER — Emergency Department (HOSPITAL_BASED_OUTPATIENT_CLINIC_OR_DEPARTMENT_OTHER)
Admission: EM | Admit: 2017-10-16 | Discharge: 2017-10-16 | Disposition: A | Discharge status: Home / Self Care | Payer: Medicare Other | Attending: Emergency Medicine | Admitting: Emergency Medicine

## 2017-10-16 ENCOUNTER — Other Ambulatory Visit: Payer: Self-pay

## 2017-10-16 DIAGNOSIS — I10 Essential (primary) hypertension: Secondary | ICD-10-CM | POA: Insufficient documentation

## 2017-10-16 DIAGNOSIS — F329 Major depressive disorder, single episode, unspecified: Secondary | ICD-10-CM | POA: Diagnosis not present

## 2017-10-16 DIAGNOSIS — F419 Anxiety disorder, unspecified: Secondary | ICD-10-CM | POA: Diagnosis not present

## 2017-10-16 DIAGNOSIS — F909 Attention-deficit hyperactivity disorder, unspecified type: Secondary | ICD-10-CM | POA: Diagnosis not present

## 2017-10-16 DIAGNOSIS — F1123 Opioid dependence with withdrawal: Secondary | ICD-10-CM | POA: Insufficient documentation

## 2017-10-16 DIAGNOSIS — F17210 Nicotine dependence, cigarettes, uncomplicated: Secondary | ICD-10-CM | POA: Insufficient documentation

## 2017-10-16 DIAGNOSIS — Z79899 Other long term (current) drug therapy: Secondary | ICD-10-CM | POA: Diagnosis not present

## 2017-10-16 DIAGNOSIS — R112 Nausea with vomiting, unspecified: Secondary | ICD-10-CM | POA: Diagnosis present

## 2017-10-16 DIAGNOSIS — F1193 Opioid use, unspecified with withdrawal: Secondary | ICD-10-CM

## 2017-10-16 LAB — CBC WITH DIFFERENTIAL/PLATELET
BASOS ABS: 0.1 10*3/uL (ref 0.0–0.1)
Basophils Relative: 0 %
EOS PCT: 2 %
Eosinophils Absolute: 0.3 10*3/uL (ref 0.0–0.7)
HCT: 44.1 % (ref 36.0–46.0)
HEMOGLOBIN: 14.9 g/dL (ref 12.0–15.0)
LYMPHS PCT: 27 %
Lymphs Abs: 3.7 10*3/uL (ref 0.7–4.0)
MCH: 26.8 pg (ref 26.0–34.0)
MCHC: 33.8 g/dL (ref 30.0–36.0)
MCV: 79.5 fL (ref 78.0–100.0)
Monocytes Absolute: 1.1 10*3/uL — ABNORMAL HIGH (ref 0.1–1.0)
Monocytes Relative: 8 %
NEUTROS PCT: 63 %
Neutro Abs: 8.9 10*3/uL — ABNORMAL HIGH (ref 1.7–7.7)
PLATELETS: 445 10*3/uL — AB (ref 150–400)
RBC: 5.55 MIL/uL — AB (ref 3.87–5.11)
RDW: 13.5 % (ref 11.5–15.5)
WBC: 14 10*3/uL — AB (ref 4.0–10.5)

## 2017-10-16 LAB — COMPREHENSIVE METABOLIC PANEL
ALK PHOS: 72 U/L (ref 38–126)
ALT: 34 U/L (ref 14–54)
AST: 27 U/L (ref 15–41)
Albumin: 4.2 g/dL (ref 3.5–5.0)
Anion gap: 9 (ref 5–15)
BUN: 23 mg/dL — AB (ref 6–20)
CHLORIDE: 102 mmol/L (ref 101–111)
CO2: 27 mmol/L (ref 22–32)
CREATININE: 0.81 mg/dL (ref 0.44–1.00)
Calcium: 9.1 mg/dL (ref 8.9–10.3)
GFR calc Af Amer: >60 mL/min (ref >60)
Glucose, Bld: 107 mg/dL — ABNORMAL HIGH (ref 65–99)
Potassium: 3.3 mmol/L — ABNORMAL LOW (ref 3.5–5.1)
SODIUM: 138 mmol/L (ref 135–145)
Total Bilirubin: 0.7 mg/dL (ref 0.3–1.2)
Total Protein: 8.3 g/dL — ABNORMAL HIGH (ref 6.5–8.1)

## 2017-10-16 LAB — LIPASE, BLOOD: Lipase: 97 U/L — ABNORMAL HIGH (ref 11–51)

## 2017-10-16 MED ORDER — CLONIDINE HCL 0.1 MG PO TABS
0.1000 mg | ORAL_TABLET | Freq: Once | ORAL | Status: AC
  Administered 2017-10-16: 0.1 mg via ORAL
  Filled 2017-10-16: qty 1

## 2017-10-16 MED ORDER — ONDANSETRON HCL 4 MG/2ML IJ SOLN
4.0000 mg | Freq: Once | INTRAMUSCULAR | Status: AC
  Administered 2017-10-16: 4 mg via INTRAVENOUS
  Filled 2017-10-16: qty 2

## 2017-10-16 MED ORDER — CLONIDINE HCL 0.1 MG PO TABS: 0.1000 mg | ORAL_TABLET | Freq: Three times a day (TID) | ORAL | 0 refills | Status: DC

## 2017-10-16 MED ORDER — SODIUM CHLORIDE 0.9 % IV BOLUS (SEPSIS)
1000.0000 mL | Freq: Once | INTRAVENOUS | Status: AC
  Administered 2017-10-16: 1000 mL via INTRAVENOUS

## 2017-10-16 MED ORDER — KETOROLAC TROMETHAMINE 30 MG/ML IJ SOLN
30.0000 mg | Freq: Once | INTRAMUSCULAR | Status: AC
  Administered 2017-10-16: 30 mg via INTRAVENOUS
  Filled 2017-10-16: qty 1

## 2017-10-16 MED ORDER — PROMETHAZINE HCL 25 MG PO TABS: 25.0000 mg | ORAL_TABLET | Freq: Four times a day (QID) | ORAL | 0 refills | Status: DC | PRN

## 2017-10-16 NOTE — ED Notes (Signed)
Pt tolerating POs

## 2017-10-16 NOTE — Discharge Instructions (Signed)
Please read and follow all provided instructions.  Your diagnoses today include:  1. Opioid withdrawal (HCC)    Tests performed today include:  Blood counts and electrolytes  Liver function tests and pancreas test -slightly high pancreas test  Vital signs. See below for your results today.   Medications prescribed:   None  Take any prescribed medications only as directed.  Home care instructions:  Follow any educational materials contained in this packet.  Follow-up instructions: Please follow-up with your primary care provider in the next 3 days for further evaluation of your symptoms.   Return instructions:   Please return to the Emergency Department if you experience worsening symptoms.   Please return if you have any other emergent concerns.  Additional Information:  Your vital signs today were: BP (!) 156/106 (BP Location: Left Arm)    Pulse 78    Temp 98.7 F (37.1 C) (Oral)    Resp 20    Ht 5\' 7"  (1.702 m)    Wt 73.3 kg (161 lb 8 oz)    LMP 09/22/2017    SpO2 99%    BMI 25.29 kg/m  If your blood pressure (BP) was elevated above 135/85 this visit, please have this repeated by your doctor within one month. --------------

## 2017-10-16 NOTE — ED Triage Notes (Signed)
Pt states she stopped taking Suboxone 8 days ago trying to detox. Reports N/V/D, chills, body aches.

## 2017-10-16 NOTE — ED Provider Notes (Signed)
[facility] EMERGENCY DEPARTMENT Provider Note   CSN: [id number] Arrival date & time: 10/16/17  1723     History   Chief Complaint Chief Complaint  Patient presents with  . Withdrawal    HPI [name] is a 36 y.o. female.  Patient with history of heroin use for approximately 15 years, was recently started on Suboxone which she took for about a month, discontinuation of Suboxone voluntarily approximately 8 days ago out of a desire to detox --presents to the emergency department today with complaints of persistent nausea and vomiting despite use of Zofran, body aches, chills, diarrhea.  Patient feels that she is dehydrated.  She has no focal abdominal pain.  No fevers, chest pain, cough.  No urinary symptoms.  States that she vomits after eating or drinking.  She has discontinued opiates in the past and has had similar symptoms, however they did not last this long. The onset of this condition was acute. The course is constant. Aggravating factors: none. Alleviating factors: none.        Past Medical History:  Diagnosis Date  . ADHD (attention deficit hyperactivity disorder)   . Anxiety   . Borderline systolic HTN   . Depression   . Heroin abuse (HCC)   . Hypertension   . Kidney stone   . Knee pain, bilateral    - patellofemoral syndrome, followed by Dr. [name] at [facility]  . Migraines   . Polysubstance abuse (HCC)    - Hx ETOH, cocaine, THC, IV heroin, narcotics  . Psychiatric care    Previous [facility] H&P notes PMHx Schizophrenia, though patient denies this.  . Seizures [facility])     Patient Active Problem List   Diagnosis Date Noted  . Soft tissue infection 08/10/2017  . Polysubstance dependence including opioid type drug, episodic abuse (HCC) 06/27/2017  . Substance induced mood disorder (HCC) 06/27/2017  . Anxiety   . Persistent recurrent vomiting 08/26/2013  . Dehydration 08/26/2013  . Acute gastroenteritis 08/26/2013  . Unspecified essential hypertension  08/26/2013  . Right knee pain 06/27/2013  . URI (upper respiratory infection) 12/02/2012  . Carpal tunnel syndrome, bilateral 04/21/2011  . Vaginitis and vulvovaginitis 04/21/2011  . ANXIETY DEPRESSION 08/20/2010  . SUBSTANCE ABUSE, MULTIPLE 08/20/2010  . ADHD 08/20/2010  . UTI 08/20/2010  . 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 DYSURIA 08/20/2010  . ELEVATED BLOOD PRESSURE WITHOUT DIAGNOSIS OF HYPERTENSION 08/20/2010    Past Surgical History:  Procedure Laterality Date  . KNEE SURGERY    . SHOULDER SURGERY      OB History    No data available       Home Medications    Prior to Admission medications   Medication Sig Start Date End Date Taking? Authorizing Provider  amphetamine-dextroamphetamine (ADDERALL XR) 30 MG 24 hr capsule Take 30 mg by mouth daily.    [provider]  carbamazepine (TEGRETOL XR) 100 MG 12 hr tablet Take half tablet (50 mg) daily at bed time 06/27/17   Rankin, Shuvon B, NP  clindamycin (CLEOCIN) 150 MG capsule Take 2 capsules (300 mg total) by mouth 3 (three) times daily. May dispense as 150mg  capsules 08/10/17   Harris, Abigail, PA-C  gabapentin (NEURONTIN) 300 MG capsule Take 1 capsule (300 mg total) by mouth 4 (four) times daily. 06/27/17   Rankin, Shuvon B, NP  gabapentin (NEURONTIN) 600 MG tablet Take 600 mg by mouth 3 (three) times daily. 06/08/17   [provider]  hydrOXYzine (VISTARIL) 25 MG capsule Take  25 mg by mouth 2 (two) times daily. 06/09/17   [provider]    Family History No family history on file.  Social History Social History   Tobacco Use  . Smoking status: Current Every Day Smoker    Packs/day: 1.00    Types: Cigarettes  . Smokeless tobacco: Never Used  Substance Use Topics  . Alcohol use: No  . Drug use: No    Comment: none for 8 days     Allergies   Shellfish allergy   Review of Systems Review of Systems  Constitutional: Positive for chills. Negative for fever.  HENT: Negative for  rhinorrhea and sore throat.   Eyes: Negative for redness.  Respiratory: Negative for cough.   Cardiovascular: Positive for palpitations. Negative for chest pain.  Gastrointestinal: Positive for diarrhea, nausea and vomiting. Negative for abdominal pain.  Genitourinary: Negative for dysuria.  Musculoskeletal: Positive for myalgias.  Skin: Negative for rash.  Neurological: Negative for headaches.     Physical Exam Updated Vital Signs BP (!) 154/114 (BP Location: Left Arm)   Pulse 87   Temp 98.7 F (37.1 C) (Oral)   Resp 20   Ht 5\' 7"  (1.702 m)   Wt 73.3 kg (161 lb 8 oz)   LMP 09/22/2017   SpO2 98%   BMI 25.29 kg/m   Physical Exam  Constitutional: She appears well-developed and well-nourished.  HENT:  Head: Normocephalic and atraumatic.  Mouth/Throat: Oropharynx is clear and moist.  Moderately dry mucous membranes  Eyes: Conjunctivae are normal. Right eye exhibits no discharge. Left eye exhibits no discharge.  Neck: Normal range of motion. Neck supple.  Cardiovascular: Normal rate, regular rhythm and normal heart sounds. Exam reveals no gallop and no friction rub.  No murmur heard. Pulmonary/Chest: Effort normal and breath sounds normal. No stridor. No respiratory distress. She has no wheezes. She has no rales.  Abdominal: Soft. There is no tenderness. There is no rebound and no guarding.  Musculoskeletal: She exhibits no edema.  Neurological: She is alert.  Skin: Skin is warm and dry.  No redness or swelling at site of previous injections.  Psychiatric: She has a normal mood and affect.  Nursing note and vitals reviewed.    ED Treatments / Results  Labs (all labs ordered are listed, but only abnormal results are displayed) Labs Reviewed  CBC WITH DIFFERENTIAL/PLATELET - Abnormal; Notable for the following components:      Result Value   WBC 14.0 (*)    RBC 5.55 (*)    Platelets 445 (*)    Neutro Abs 8.9 (*)    Monocytes Absolute 1.1 (*)    All other components  within normal limits  COMPREHENSIVE METABOLIC PANEL - Abnormal; Notable for the following components:   Potassium 3.3 (*)    Glucose, Bld 107 (*)    BUN 23 (*)    Total Protein 8.3 (*)    All other components within normal limits  LIPASE, BLOOD - Abnormal; Notable for the following components:   Lipase 97 (*)    All other components within normal limits    EKG  EKG Interpretation None       Radiology No results found.  Procedures Procedures (including critical care time)  Medications Ordered in ED Medications  sodium chloride 0.9 % bolus 1,000 mL (0 mLs Intravenous Stopped 10/16/17 2015)  sodium chloride 0.9 % bolus 1,000 mL (0 mLs Intravenous Stopped 10/16/17 2016)  ondansetron (ZOFRAN) injection 4 mg (4 mg Intravenous Given 10/16/17 1808)  ketorolac (TORADOL) 30 MG/ML injection 30 mg (30 mg Intravenous Given 10/16/17 1808)  cloNIDine (CATAPRES) tablet 0.1 mg (0.1 mg Oral Given 10/16/17 2020)     Initial Impression / Assessment and Plan / ED Course  I have reviewed the triage vital signs and the nursing notes.  Pertinent labs & imaging results that were available during my care of the patient were reviewed by me and considered in my medical decision making (see chart for details).     Patient seen and examined. Work-up initiated.  She looks a little dry, but otherwise in no distress.  Will hydrate and check lab work.  Anticipate discharged home with symptomatic treatment if she is feeling better.  She is going to follow-up with her doctor regarding Suboxone use early next week.  Vital signs reviewed and are as follows: BP (!) 154/114 (BP Location: Left Arm)   Pulse 87   Temp 98.7 F (37.1 C) (Oral)   Resp 20   Ht 5\' 7"  (1.702 m)   Wt 73.3 kg (161 lb 8 oz)   LMP 09/22/2017   SpO2 98%   BMI 25.29 kg/m   Patient feeling better after treatment with fluids, antiemetics, clonidine.  We discussed her lab results.  She has mildly elevated white blood cell count.   Abdomen reexamined and she does not have a local abdominal tenderness.  Lipase is slightly elevated at 97.  Patient does not have left upper quadrant pain.  Her vomiting is controlled and she is drinking fluids in the room.  Low concern for acute pancreatitis.  Patient feels comfortable with discharged home at this time.  We will discharged home with Phenergan, clonidine.  Plan is for her to follow-up with her doctor early this coming week to discuss symptoms and treatment of her opioid dependence.    Final Clinical Impressions(s) / ED Diagnoses   Final diagnoses:  Opioid withdrawal (HCC)   Patient with opioid withdrawal after discontinuing Suboxone 8 days ago.  She has had constellation of symptoms as documented above.  She has generalized pain with no focal tenderness.  She was treated in the emergency department with fluids and oral medications which helped her feel better.  No concerning exam findings suggest infection at this time.  Given clinical improvement, patient discharged home with symptomatic control.  ED Discharge Orders        Ordered    cloNIDine (CATAPRES) 0.1 MG tablet  3 times daily     10/16/17 2103    promethazine (PHENERGAN) 25 MG tablet  Every 6 hours PRN     10/16/17 2103       Renne CriglerGeiple, Liviya Santini, PA-C 10/16/17 2144    Little, Ambrose Finlandachel Morgan, MD 10/16/17 2157

## 2017-12-08 DIAGNOSIS — F1920 Other psychoactive substance dependence, uncomplicated: Secondary | ICD-10-CM | POA: Insufficient documentation

## 2018-01-07 DIAGNOSIS — G2401 Drug induced subacute dyskinesia: Secondary | ICD-10-CM | POA: Insufficient documentation

## 2018-08-09 ENCOUNTER — Emergency Department (HOSPITAL_COMMUNITY)
Admission: EM | Admit: 2018-08-09 | Discharge: 2018-08-09 | Disposition: A | Discharge status: Home / Self Care | Payer: Medicare Other | Attending: Emergency Medicine | Admitting: Emergency Medicine

## 2018-08-09 ENCOUNTER — Encounter (HOSPITAL_COMMUNITY): Payer: Self-pay | Admitting: *Deleted

## 2018-08-09 DIAGNOSIS — I10 Essential (primary) hypertension: Secondary | ICD-10-CM | POA: Insufficient documentation

## 2018-08-09 DIAGNOSIS — F17210 Nicotine dependence, cigarettes, uncomplicated: Secondary | ICD-10-CM | POA: Insufficient documentation

## 2018-08-09 DIAGNOSIS — K0889 Other specified disorders of teeth and supporting structures: Secondary | ICD-10-CM | POA: Diagnosis present

## 2018-08-09 DIAGNOSIS — Z79899 Other long term (current) drug therapy: Secondary | ICD-10-CM | POA: Diagnosis not present

## 2018-08-09 DIAGNOSIS — K029 Dental caries, unspecified: Secondary | ICD-10-CM | POA: Diagnosis not present

## 2018-08-09 MED ORDER — NAPROXEN 375 MG PO TABS: 375.0000 mg | ORAL_TABLET | Freq: Two times a day (BID) | ORAL | 0 refills | Status: DC

## 2018-08-09 NOTE — ED Provider Notes (Signed)
[facility] EMERGENCY DEPARTMENT Provider Note   CSN: [id number] Arrival date & time: 08/09/18  1419     History   Chief Complaint Chief Complaint  Patient presents with  . Dental Pain    HPI [name] is a 37 y.o. female.  Patient is a 37 year old female who presents with dental pain.  She states her teeth have been hurting for about 6 months or so.  There her back upper teeth bilaterally.  She denies any facial swelling.  No fevers.  No vomiting.  She has not been able to get into a dentist she states because of her Medicare.  Her mom is tried calling a couple Solicitor without success.  She has been taking Tylenol without improvement in symptoms.  She does have a prior history of heroin abuse.     Past Medical History:  Diagnosis Date  . ADHD (attention deficit hyperactivity disorder)   . Anxiety   . Borderline systolic HTN   . Depression   . Heroin abuse (HCC)   . Hypertension   . Kidney stone   . Knee pain, bilateral    - patellofemoral syndrome, followed by Dr. 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 Migraines   . Polysubstance abuse (HCC)    - Hx ETOH, cocaine, THC, IV heroin, narcotics  . Psychiatric care    Previous [facility] H&P notes PMHx Schizophrenia, though patient denies this.  . Seizures [facility])     Patient Active Problem List   Diagnosis Date Noted  . Soft tissue infection 08/10/2017  . Polysubstance dependence including opioid type drug, episodic abuse (HCC) 06/27/2017  . Substance induced mood disorder (HCC) 06/27/2017  . Anxiety   . Persistent recurrent vomiting 08/26/2013  . Dehydration 08/26/2013  . Acute gastroenteritis 08/26/2013  . Unspecified essential hypertension 08/26/2013  . Right knee pain 06/27/2013  . URI (upper respiratory infection) 12/02/2012  . Carpal tunnel syndrome, bilateral 04/21/2011  . Vaginitis and vulvovaginitis 04/21/2011  . ANXIETY DEPRESSION 08/20/2010  . SUBSTANCE ABUSE, MULTIPLE 08/20/2010  . ADHD 08/20/2010   . UTI 08/20/2010  . 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 DYSURIA 08/20/2010  . ELEVATED BLOOD PRESSURE WITHOUT DIAGNOSIS OF HYPERTENSION 08/20/2010    Past Surgical History:  Procedure Laterality Date  . KNEE SURGERY    . SHOULDER SURGERY       OB History   None      Home Medications    Prior to Admission medications   Medication Sig Start Date End Date Taking? Authorizing Provider  amphetamine-dextroamphetamine (ADDERALL XR) 30 MG 24 hr capsule Take 30 mg by mouth daily.    [provider]  carbamazepine (TEGRETOL XR) 100 MG 12 hr tablet Take half tablet (50 mg) daily at bed time 06/27/17   Rankin, Shuvon B, NP  clindamycin (CLEOCIN) 150 MG capsule Take 2 capsules (300 mg total) by mouth 3 (three) times daily. May dispense as 150mg  capsules 08/10/17   Arthor Captain, PA-C  cloNIDine (CATAPRES) 0.1 MG tablet Take 1 tablet (0.1 mg total) 3 (three) times daily by mouth. 10/16/17   Renne Crigler, PA-C  gabapentin (NEURONTIN) 300 MG capsule Take 1 capsule (300 mg total) by mouth 4 (four) times daily. 06/27/17   Rankin, Shuvon B, NP  hydrOXYzine (VISTARIL) 25 MG capsule Take 25 mg by mouth 2 (two) times daily. 06/09/17   [provider]  naproxen (NAPROSYN) 375 MG tablet Take 1 tablet (375 mg total) by mouth 2 (two) times daily. 08/09/18   Bethel Sirois,  Shawna Orleans, MD  promethazine (PHENERGAN) 25 MG tablet Take 1 tablet (25 mg total) every 6 (six) hours as needed by mouth for nausea or vomiting. 10/16/17   Renne Crigler, PA-C  cetirizine (ZYRTEC) 10 MG tablet Take 10 mg by mouth daily as needed for allergies.   11/29/15  [provider]  hydrochlorothiazide (HYDRODIURIL) 25 MG tablet Take 25 mg by mouth daily.  11/29/15  [provider]  metoCLOPramide (REGLAN) 10 MG tablet Take 1 tablet (10 mg total) by mouth every 6 (six) hours as needed. 08/29/13 11/29/15  Azalia Bilis, MD  metoprolol succinate (TOPROL-XL) 100 MG 24 hr tablet Take 100 mg by mouth daily. Take  with or immediately following a meal.  11/29/15  [provider]  traZODone (DESYREL) 50 MG tablet Take 50 mg by mouth at bedtime.  11/29/15  [provider]    Family History History reviewed. No pertinent family history.  Social History Social History   Tobacco Use  . Smoking status: Current Every Day Smoker    Packs/day: 1.00    Types: Cigarettes  . Smokeless tobacco: Never Used  Substance Use Topics  . Alcohol use: No  . Drug use: No    Types: IV    Comment: none for 8 days     Allergies   Shellfish allergy   Review of Systems Review of Systems  Constitutional: Negative for fever.  HENT: Positive for dental problem.   Gastrointestinal: Negative for nausea and vomiting.  Musculoskeletal: Negative for back pain and neck pain.  Skin: Negative for wound.  Neurological: Negative for weakness, numbness and headaches.     Physical Exam Updated Vital Signs BP 136/90 (BP Location: Right Arm)   Pulse 64   Temp 98 F (36.7 C) (Oral)   Resp 20   SpO2 100%   Physical Exam  Constitutional: She is oriented to person, place, and time. She appears well-developed and well-nourished.  HENT:  Head: Normocephalic and atraumatic.  Patient has some dental caries to her upper back molars bilaterally.  There is no facial swelling.  No trismus.  Uvula is midline.  No erythema or drainage from the teeth.  Neck: Normal range of motion. Neck supple.  Cardiovascular: Normal rate.  Pulmonary/Chest: Effort normal.  Neurological: She is alert and oriented to person, place, and time.  Skin: Skin is warm and dry.  Psychiatric: She has a normal mood and affect.     ED Treatments / Results  Labs (all labs ordered are listed, but only abnormal results are displayed) Labs Reviewed - No data to display  EKG None  Radiology No results found.  Procedures Procedures (including critical care time)  Medications Ordered in ED Medications - No data to  display   Initial Impression / Assessment and Plan / ED Course  I have reviewed the triage vital signs and the nursing notes.  Pertinent labs & imaging results that were available during my care of the patient were reviewed by me and considered in my medical decision making (see chart for details).     Patient has evidence of dental caries.  She does not have any evidence of acute infection.  She was given a prescription for Naprosyn.  She was given a list of outpatient dental resources.  Return precautions were given.  Final Clinical Impressions(s) / ED Diagnoses   Final diagnoses:  Dental caries    ED Discharge Orders         Ordered    naproxen (NAPROSYN) 375  MG tablet  2 times daily     08/09/18 1605           Rolan Bucco, MD 08/09/18 1610

## 2018-08-09 NOTE — ED Triage Notes (Signed)
Pt in c/o right and left sided dental pain for several months, states she has several holes in her teeth that cause her significant pain, unable to afford a dentist at this time

## 2018-08-09 NOTE — ED Notes (Signed)
Pt verbalized understanding of d/c instructions and has no further questions, VSS, NAD.  

## 2018-09-08 ENCOUNTER — Emergency Department (HOSPITAL_COMMUNITY)
Admission: EM | Admit: 2018-09-08 | Discharge: 2018-09-09 | Disposition: A | Discharge status: Home / Self Care | Payer: Medicare Other | Attending: Emergency Medicine | Admitting: Emergency Medicine

## 2018-09-08 DIAGNOSIS — R4182 Altered mental status, unspecified: Secondary | ICD-10-CM | POA: Diagnosis not present

## 2018-09-08 DIAGNOSIS — Z79899 Other long term (current) drug therapy: Secondary | ICD-10-CM | POA: Insufficient documentation

## 2018-09-08 DIAGNOSIS — I10 Essential (primary) hypertension: Secondary | ICD-10-CM | POA: Diagnosis not present

## 2018-09-08 DIAGNOSIS — F17210 Nicotine dependence, cigarettes, uncomplicated: Secondary | ICD-10-CM | POA: Diagnosis not present

## 2018-09-08 LAB — CBG MONITORING, ED: GLUCOSE-CAPILLARY: 98 mg/dL (ref 70–99)

## 2018-09-08 NOTE — ED Triage Notes (Addendum)
Pt arrived via [facility] from parent's home where father reported a change in mental status over the past 2-3 days. Pt has hx of tardive dyskinesia, per father via EMS. Pt presents with lethargy. Pt not answering questions at this time. Pt has hx of substance abuse, track marks noted.

## 2018-09-08 NOTE — ED Provider Notes (Signed)
[facility] EMERGENCY DEPARTMENT Provider Note   CSN: [id number] Arrival date & time: 09/08/18  2207     History   Chief Complaint Chief Complaint  Patient presents with  . Altered Mental Status    HPI [name] is a 37 y.o. female.  Patient brought to the emergency department by her father to be evaluated for altered mental status.  Father reports that she has become agitated and has not been acting normally over the last 2 days or so.  Patient and father report that she has been unable to sleep.  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  Tonight she became extremely agitated and ran out of the house.  Father reports that she ran around the neighborhood banging on neighbors doors and yelling.  She only has a slight memory of doing this, cannot tell me why she did it.  She recently had her methadone dose increased.  She denies any illegal substance abuse or alcohol use.     Past Medical History:  Diagnosis Date  . ADHD (attention deficit hyperactivity disorder)   . Anxiety   . Borderline systolic HTN   . Depression   . Heroin abuse (HCC)   . Hypertension   . Kidney stone   . 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 Migraines   . Polysubstance abuse (HCC)    - Hx ETOH, cocaine, THC, IV heroin, narcotics  . Psychiatric care    Previous BH H&P notes PMHx Schizophrenia, though patient denies this.  . Seizures [facility])     Patient Active Problem List   Diagnosis Date Noted  . Soft tissue infection 08/10/2017  . Polysubstance dependence including opioid type drug, episodic abuse (HCC) 06/27/2017  . Substance induced mood disorder (HCC) 06/27/2017  . Anxiety   . Persistent recurrent vomiting 08/26/2013  . Dehydration 08/26/2013  . Acute gastroenteritis 08/26/2013  . Unspecified essential  hypertension 08/26/2013  . Right knee pain 06/27/2013  . URI (upper respiratory infection) 12/02/2012  . Carpal tunnel syndrome, bilateral 04/21/2011  . Vaginitis and vulvovaginitis 04/21/2011  . ANXIETY DEPRESSION 08/20/2010  . SUBSTANCE ABUSE, MULTIPLE 08/20/2010  . ADHD 08/20/2010  . UTI 08/20/2010  . 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 DYSURIA 08/20/2010  . ELEVATED BLOOD PRESSURE WITHOUT DIAGNOSIS OF HYPERTENSION 08/20/2010    Past Surgical History:  Procedure Laterality Date  . KNEE SURGERY    . SHOULDER SURGERY       OB History   None      Home Medications    Prior to Admission medications   Medication Sig Start Date End Date Taking? Authorizing Provider  clonazePAM (KLONOPIN) 0.25 MG disintegrating tablet Take 0.25 mg by mouth 3 (three) times daily. 08/28/18  Yes [provider]  methadone (DOLOPHINE) 10 MG/ML solution Take 99 mg by mouth daily.   Yes [provider]  carbamazepine (TEGRETOL XR) 100 MG 12 hr tablet Take half tablet (50 mg) daily at bed time Patient not taking: Reported on 09/09/2018 06/27/17   Rankin, Shuvon B, NP  clindamycin (CLEOCIN) 150 MG capsule Take 2 capsules (300 mg total) by mouth 3 (three) times daily. May dispense as 150mg  capsules Patient not taking: Reported on 09/09/2018 08/10/17   Arthor Captain, PA-C  cloNIDine (CATAPRES) 0.1 MG tablet Take 1 tablet (0.1 mg total) 3 (three)  times daily by mouth. Patient not taking: Reported on 09/09/2018 10/16/17   Renne Crigler, PA-C  gabapentin (NEURONTIN) 300 MG capsule Take 1 capsule (300 mg total) by mouth 4 (four) times daily. Patient not taking: Reported on 09/09/2018 06/27/17   Rankin, Shuvon B, NP  naproxen (NAPROSYN) 375 MG tablet Take 1 tablet (375 mg total) by mouth 2 (two) times daily. Patient not taking: Reported on 09/09/2018 08/09/18   Rolan Bucco, MD  promethazine (PHENERGAN) 25 MG tablet Take 1 tablet (25 mg total) every 6 (six) hours as needed by mouth for nausea or  vomiting. Patient not taking: Reported on 09/09/2018 10/16/17   Renne Crigler, PA-C    Family History No family history on file.  Social History Social History   Tobacco Use  . Smoking status: Current Every Day Smoker    Packs/day: 1.00    Types: Cigarettes  . Smokeless tobacco: Never Used  Substance Use Topics  . Alcohol use: No  . Drug use: No    Types: IV    Comment: none for 8 days     Allergies   Shellfish allergy   Review of Systems Review of Systems  Psychiatric/Behavioral: Positive for agitation, confusion and sleep disturbance. The patient is nervous/anxious.   All other systems reviewed and are negative.    Physical Exam Updated Vital Signs BP 127/89   Pulse 74   Temp 97.8 F (36.6 C) (Oral)   Resp 11   SpO2 98%   Physical Exam  Constitutional: She is oriented to person, place, and time. She appears well-developed and well-nourished. No distress.  HENT:  Head: Normocephalic and atraumatic.  Right Ear: Hearing normal.  Left Ear: Hearing normal.  Nose: Nose normal.  Mouth/Throat: Oropharynx is clear and moist and mucous membranes are normal.  Eyes: Pupils are equal, round, and reactive to light. Conjunctivae and EOM are normal.  Neck: Normal range of motion. Neck supple.  Cardiovascular: Regular rhythm, S1 normal and S2 normal. Exam reveals no gallop and no friction rub.  No murmur heard. Pulmonary/Chest: Effort normal and breath sounds normal. No respiratory distress. She exhibits no tenderness.  Abdominal: Soft. Normal appearance and bowel sounds are normal. There is no hepatosplenomegaly. There is no tenderness. There is no rebound, no guarding, no tenderness at McBurney's point and negative Murphy's sign. No hernia.  Musculoskeletal: Normal range of motion.  Neurological: She is alert and oriented to person, place, and time. She has normal strength. No cranial nerve deficit or sensory deficit. Coordination normal. GCS eye subscore is 4. GCS verbal  subscore is 5. GCS motor subscore is 6.  Skin: Skin is warm, dry and intact. No rash noted. No cyanosis.  Psychiatric: Her speech is normal. Thought content normal. Her mood appears anxious. She is agitated.  Nursing note and vitals reviewed.    ED Treatments / Results  Labs (all labs ordered are listed, but only abnormal results are displayed) Labs Reviewed  CBC WITH DIFFERENTIAL/PLATELET - Abnormal; Notable for the following components:      Result Value   RBC 5.18 (*)    All other components within normal limits  COMPREHENSIVE METABOLIC PANEL - Abnormal; Notable for the following components:   Glucose, Bld 104 (*)    All other components within normal limits  RAPID URINE DRUG SCREEN, HOSP PERFORMED - Abnormal; Notable for the following components:   Opiates POSITIVE (*)    All other components within normal limits  ETHANOL  CBG MONITORING, ED  I-STAT BETA HCG  BLOOD, ED ([facility], [facility], [facility] ONLY)    EKG EKG Interpretation  Date/Time:  Friday September 09 2018 00:37:04 EDT Ventricular Rate:  80 PR Interval:    QRS Duration: 85 QT Interval:  391 QTC Calculation: 451 R Axis:   68 Text Interpretation:  Sinus rhythm Borderline T abnormalities, anterior leads Confirmed by Gilda Crease [phone]) on 09/09/2018 12:38:57 AM   Radiology No results found.  Procedures Procedures (including critical care time)  Medications Ordered in ED Medications  LORazepam (ATIVAN) tablet 1 mg (1 mg Oral Given 09/09/18 0307)     Initial Impression / Assessment and Plan / ED Course  I have reviewed the triage vital signs and the nursing notes.  Pertinent labs & imaging results that were available during my care of the patient were reviewed by me and considered in my medical decision making (see chart for details).     Patient brought to the emergency department by her father to be evaluated for strange behavior.  She has not been sleeping over the last several days and has had some increase  in her movement disorder.  She reports a history of tardive dyskinesia.  Reviewing records reveals a history of schizophrenia.  I suspect that this was some type of psychotic episode, but she is back to her normal baseline here in the ER.  Work-up was unremarkable.  She is mentating fine, holding a conversation without difficulty.  I offered her evaluation by behavioral health, but she would prefer to talk to her clinic in the morning.  She is with her father, he is in agreement with this plan and he will take her home.  Final Clinical Impressions(s) / ED Diagnoses   Final diagnoses:  Altered mental status, unspecified altered mental status type    ED Discharge Orders    None       Gilda Crease, MD 09/09/18 [phone]

## 2018-09-09 DIAGNOSIS — R4182 Altered mental status, unspecified: Secondary | ICD-10-CM | POA: Diagnosis not present

## 2018-09-09 LAB — CBC WITH DIFFERENTIAL/PLATELET
Abs Immature Granulocytes: 0 10*3/uL (ref 0.0–0.1)
BASOS ABS: 0.1 10*3/uL (ref 0.0–0.1)
Basophils Relative: 1 %
Eosinophils Absolute: 0 10*3/uL (ref 0.0–0.7)
Eosinophils Relative: 0 %
HCT: 45.4 % (ref 36.0–46.0)
Hemoglobin: 14.6 g/dL (ref 12.0–15.0)
Immature Granulocytes: 0 %
LYMPHS PCT: 19 %
Lymphs Abs: 2 10*3/uL (ref 0.7–4.0)
MCH: 28.2 pg (ref 26.0–34.0)
MCHC: 32.2 g/dL (ref 30.0–36.0)
MCV: 87.6 fL (ref 78.0–100.0)
Monocytes Absolute: 0.5 10*3/uL (ref 0.1–1.0)
Monocytes Relative: 5 %
NEUTROS PCT: 75 %
Neutro Abs: 7.7 10*3/uL (ref 1.7–7.7)
Platelets: 387 10*3/uL (ref 150–400)
RBC: 5.18 MIL/uL — AB (ref 3.87–5.11)
RDW: 13.6 % (ref 11.5–15.5)
WBC: 10.3 10*3/uL (ref 4.0–10.5)

## 2018-09-09 LAB — COMPREHENSIVE METABOLIC PANEL
ALBUMIN: 4.3 g/dL (ref 3.5–5.0)
ALK PHOS: 66 U/L (ref 38–126)
ALT: 24 U/L (ref 0–44)
ANION GAP: 8 (ref 5–15)
AST: 25 U/L (ref 15–41)
BUN: 18 mg/dL (ref 6–20)
CO2: 22 mmol/L (ref 22–32)
Calcium: 9.9 mg/dL (ref 8.9–10.3)
Chloride: 106 mmol/L (ref 98–111)
Creatinine, Ser: 0.88 mg/dL (ref 0.44–1.00)
GFR calc Af Amer: >60 mL/min (ref >60)
GFR calc non Af Amer: >60 mL/min (ref >60)
GLUCOSE: 104 mg/dL — AB (ref 70–99)
POTASSIUM: 3.8 mmol/L (ref 3.5–5.1)
SODIUM: 136 mmol/L (ref 135–145)
Total Bilirubin: 0.7 mg/dL (ref 0.3–1.2)
Total Protein: 7.9 g/dL (ref 6.5–8.1)

## 2018-09-09 LAB — RAPID URINE DRUG SCREEN, HOSP PERFORMED
AMPHETAMINES: NOT DETECTED
BARBITURATES: NOT DETECTED
BENZODIAZEPINES: NOT DETECTED
Cocaine: NOT DETECTED
Opiates: POSITIVE — AB
TETRAHYDROCANNABINOL: NOT DETECTED

## 2018-09-09 LAB — I-STAT BETA HCG BLOOD, ED (MC, WL, AP ONLY): I-stat hCG, quantitative: <5 m[IU]/mL (ref <5)

## 2018-09-09 LAB — ETHANOL: Alcohol, Ethyl (B): <10 mg/dL (ref <10)

## 2018-09-09 MED ORDER — LORAZEPAM 1 MG PO TABS
1.0000 mg | ORAL_TABLET | Freq: Once | ORAL | Status: AC
  Administered 2018-09-09: 1 mg via ORAL
  Filled 2018-09-09: qty 1

## 2018-09-09 NOTE — Discharge Instructions (Signed)
Please call your clinic in the morning to go over your medications and schedule follow-up

## 2018-09-12 ENCOUNTER — Emergency Department (HOSPITAL_COMMUNITY)
Admission: EM | Admit: 2018-09-12 | Discharge: 2018-09-13 | Disposition: A | Discharge status: Other Acute Inpatient Hospital | Payer: Medicare Other | Attending: Emergency Medicine | Admitting: Emergency Medicine

## 2018-09-12 ENCOUNTER — Other Ambulatory Visit: Payer: Self-pay

## 2018-09-12 DIAGNOSIS — F4310 Post-traumatic stress disorder, unspecified: Secondary | ICD-10-CM | POA: Insufficient documentation

## 2018-09-12 DIAGNOSIS — Z79899 Other long term (current) drug therapy: Secondary | ICD-10-CM | POA: Insufficient documentation

## 2018-09-12 DIAGNOSIS — F17210 Nicotine dependence, cigarettes, uncomplicated: Secondary | ICD-10-CM | POA: Insufficient documentation

## 2018-09-12 DIAGNOSIS — F341 Dysthymic disorder: Secondary | ICD-10-CM

## 2018-09-12 DIAGNOSIS — F333 Major depressive disorder, recurrent, severe with psychotic symptoms: Secondary | ICD-10-CM | POA: Insufficient documentation

## 2018-09-12 DIAGNOSIS — F1910 Other psychoactive substance abuse, uncomplicated: Secondary | ICD-10-CM | POA: Insufficient documentation

## 2018-09-12 DIAGNOSIS — I10 Essential (primary) hypertension: Secondary | ICD-10-CM | POA: Insufficient documentation

## 2018-09-12 LAB — RAPID URINE DRUG SCREEN, HOSP PERFORMED
AMPHETAMINES: NOT DETECTED
BARBITURATES: NOT DETECTED
BENZODIAZEPINES: NOT DETECTED
Cocaine: NOT DETECTED
Opiates: NOT DETECTED
TETRAHYDROCANNABINOL: NOT DETECTED

## 2018-09-12 LAB — COMPREHENSIVE METABOLIC PANEL
ALT: 16 U/L (ref 0–44)
ANION GAP: 11 (ref 5–15)
AST: 22 U/L (ref 15–41)
Albumin: 3.9 g/dL (ref 3.5–5.0)
Alkaline Phosphatase: 51 U/L (ref 38–126)
BUN: 21 mg/dL — ABNORMAL HIGH (ref 6–20)
CHLORIDE: 104 mmol/L (ref 98–111)
CO2: 26 mmol/L (ref 22–32)
CREATININE: 0.86 mg/dL (ref 0.44–1.00)
Calcium: 9.2 mg/dL (ref 8.9–10.3)
Glucose, Bld: 119 mg/dL — ABNORMAL HIGH (ref 70–99)
POTASSIUM: 3.5 mmol/L (ref 3.5–5.1)
SODIUM: 141 mmol/L (ref 135–145)
Total Bilirubin: 0.4 mg/dL (ref 0.3–1.2)
Total Protein: 7.3 g/dL (ref 6.5–8.1)

## 2018-09-12 LAB — CBC WITH DIFFERENTIAL/PLATELET
BASOS PCT: 0 %
Basophils Absolute: 0 10*3/uL (ref 0.0–0.1)
EOS ABS: 0.1 10*3/uL (ref 0.0–0.7)
Eosinophils Relative: 1 %
HCT: 39.8 % (ref 36.0–46.0)
Hemoglobin: 13.2 g/dL (ref 12.0–15.0)
LYMPHS ABS: 2.5 10*3/uL (ref 0.7–4.0)
Lymphocytes Relative: 32 %
MCH: 28.8 pg (ref 26.0–34.0)
MCHC: 33.2 g/dL (ref 30.0–36.0)
MCV: 86.9 fL (ref 78.0–100.0)
Monocytes Absolute: 0.5 10*3/uL (ref 0.1–1.0)
Monocytes Relative: 6 %
NEUTROS ABS: 4.8 10*3/uL (ref 1.7–7.7)
Neutrophils Relative %: 61 %
PLATELETS: 413 10*3/uL — AB (ref 150–400)
RBC: 4.58 MIL/uL (ref 3.87–5.11)
RDW: 14.1 % (ref 11.5–15.5)
WBC: 8 10*3/uL (ref 4.0–10.5)

## 2018-09-12 LAB — PREGNANCY, URINE: PREG TEST UR: NEGATIVE

## 2018-09-12 LAB — SALICYLATE LEVEL: Salicylate Lvl: <7 mg/dL (ref 2.8–30.0)

## 2018-09-12 LAB — ETHANOL: Alcohol, Ethyl (B): <10 mg/dL (ref <10)

## 2018-09-12 LAB — ACETAMINOPHEN LEVEL: Acetaminophen (Tylenol), Serum: <10 ug/mL — ABNORMAL LOW (ref 10–30)

## 2018-09-12 MED ORDER — DOCUSATE SODIUM 100 MG PO CAPS
100.0000 mg | ORAL_CAPSULE | Freq: Once | ORAL | Status: AC
  Administered 2018-09-12: 100 mg via ORAL
  Filled 2018-09-12: qty 1

## 2018-09-12 MED ORDER — ONDANSETRON HCL 4 MG PO TABS: 4.0000 mg | ORAL_TABLET | Freq: Three times a day (TID) | ORAL | Status: DC | PRN

## 2018-09-12 MED ORDER — ONDANSETRON 4 MG PO TBDP: 4.0000 mg | ORAL_TABLET | Freq: Once | ORAL | Status: DC

## 2018-09-12 MED ORDER — ACETAMINOPHEN 325 MG PO TABS
650.0000 mg | ORAL_TABLET | ORAL | Status: DC | PRN
  Administered 2018-09-12: 650 mg via ORAL
  Filled 2018-09-12: qty 2

## 2018-09-12 MED ORDER — ALUM & MAG HYDROXIDE-SIMETH 200-200-20 MG/5ML PO SUSP: 30.0000 mL | Freq: Four times a day (QID) | ORAL | Status: DC | PRN

## 2018-09-12 NOTE — BH Assessment (Signed)
[facility] Assessment Progress Note Case was staffed with Lord DNP who recommended a inpatient admission to assist with stabilization.       

## 2018-09-12 NOTE — ED Notes (Signed)
Bed: WA31 Expected date:  Expected time:  Means of arrival:  Comments: 

## 2018-09-12 NOTE — ED Triage Notes (Signed)
Pt arrived with mother who stated that patient has been running around neighborhood naked and acting manic.  Mom called it "a psychotic episode."  Pt appears very childlike."  Mom states pt "has regressed back to childhood."  [facility].

## 2018-09-12 NOTE — ED Provider Notes (Signed)
[facility]-EMERGENCY DEPT Provider Note   CSN: [id number] Arrival date & time: 09/12/18  1512     History   Chief Complaint Chief Complaint  Patient presents with  . Medical Clearance    HPI [name] is a 37 y.o. female.  The history is provided by the patient. No language interpreter was used.   [name] is a 37 y.o. female who presents to the Emergency Department complaining of medical clearance. Presents to the emergency department accompanied by her mother for psychiatric evaluation and medical clearance. She has a history of bipolar disorder as well as PTSD and has been acting differently for the last week. The patient reports increased anxiety and paranoia. She feels afraid at home. She has been leaving the house for hours at a time and is not sure what she has been doing. She thinks she might be hallucinating but she is not sure. She denies any chest pain, nausea, vomiting. She is on daily methadone therapy since March for history of heroin abuse. She has been unable to take her psychiatric medications since starting on methadone. She has a history of prior hospitalizations as well as tardive dyskinesia. She denies any SI, HI but feels very scared. Past Medical History:  Diagnosis Date  . ADHD (attention deficit hyperactivity disorder)   . Anxiety   . Borderline systolic HTN   . Depression   . Heroin abuse (HCC)   . Hypertension   . Kidney stone   . Knee pain, bilateral    - patellofemoral syndrome, followed by Dr. 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 Migraines   . Polysubstance abuse (HCC)    - Hx ETOH, cocaine, THC, IV heroin, narcotics  . Psychiatric care    Previous BH H&P notes PMHx Schizophrenia, though patient denies this.  . Seizures [facility])     Patient Active Problem List   Diagnosis Date Noted  . Soft tissue infection 08/10/2017  . Polysubstance dependence including opioid type drug, episodic abuse (HCC) 06/27/2017  . Substance induced  mood disorder (HCC) 06/27/2017  . Anxiety   . Persistent recurrent vomiting 08/26/2013  . Dehydration 08/26/2013  . Acute gastroenteritis 08/26/2013  . Unspecified essential hypertension 08/26/2013  . Right knee pain 06/27/2013  . URI (upper respiratory infection) 12/02/2012  . Carpal tunnel syndrome, bilateral 04/21/2011  . Vaginitis and vulvovaginitis 04/21/2011  . ANXIETY DEPRESSION 08/20/2010  . SUBSTANCE ABUSE, MULTIPLE 08/20/2010  . ADHD 08/20/2010  . UTI 08/20/2010  . 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 DYSURIA 08/20/2010  . ELEVATED BLOOD PRESSURE WITHOUT DIAGNOSIS OF HYPERTENSION 08/20/2010    Past Surgical History:  Procedure Laterality Date  . KNEE SURGERY    . SHOULDER SURGERY       OB History   None      Home Medications    Prior to Admission medications   Medication Sig Start Date End Date Taking? Authorizing Provider  methadone (DOLOPHINE) 10 MG/ML solution Take 99 mg by mouth daily.   Yes [provider]  carbamazepine (TEGRETOL XR) 100 MG 12 hr tablet Take half tablet (50 mg) daily at bed time Patient not taking: Reported on 09/12/2018 06/27/17   Rankin, Shuvon B, NP  clindamycin (CLEOCIN) 150 MG capsule Take 2 capsules (300 mg total) by mouth 3 (three) times daily. May dispense as 150mg  capsules Patient not taking: Reported on 09/12/2018 08/10/17   Arthor Captain, PA-C  cloNIDine (CATAPRES) 0.1 MG tablet Take 1 tablet (0.1 mg total) 3 (three) times daily  by mouth. Patient not taking: Reported on 09/12/2018 10/16/17   Renne Crigler, PA-C  gabapentin (NEURONTIN) 300 MG capsule Take 1 capsule (300 mg total) by mouth 4 (four) times daily. Patient not taking: Reported on 09/12/2018 06/27/17   Rankin, Shuvon B, NP  naproxen (NAPROSYN) 375 MG tablet Take 1 tablet (375 mg total) by mouth 2 (two) times daily. Patient not taking: Reported on 09/12/2018 08/09/18   Rolan Bucco, MD  promethazine (PHENERGAN) 25 MG tablet Take 1 tablet (25 mg total) every 6  (six) hours as needed by mouth for nausea or vomiting. Patient not taking: Reported on 09/09/2018 10/16/17   Renne Crigler, PA-C    Family History No family history on file.  Social History Social History   Tobacco Use  . Smoking status: Current Every Day Smoker    Packs/day: 1.00    Types: Cigarettes  . Smokeless tobacco: Never Used  Substance Use Topics  . Alcohol use: No  . Drug use: No    Types: IV    Comment: none for 8 days     Allergies   Shellfish allergy   Review of Systems Review of Systems  All other systems reviewed and are negative.    Physical Exam Updated Vital Signs BP (!) 167/104   Pulse 80   Temp 98.7 F (37.1 C) (Oral)   Resp 18   LMP 09/12/2018   SpO2 99%   Physical Exam  Constitutional: She is oriented to person, place, and time. She appears well-developed and well-nourished.  HENT:  Head: Normocephalic and atraumatic.  Cardiovascular: Normal rate and regular rhythm.  Pulmonary/Chest: Effort normal. No respiratory distress.  Musculoskeletal: Normal range of motion.  Neurological: She is alert and oriented to person, place, and time.  Skin: Skin is warm.  Psychiatric:  Flat affect  Nursing note and vitals reviewed.    ED Treatments / Results  Labs (all labs ordered are listed, but only abnormal results are displayed) Labs Reviewed  PREGNANCY, URINE  RAPID URINE DRUG SCREEN, HOSP PERFORMED  COMPREHENSIVE METABOLIC PANEL  CBC WITH DIFFERENTIAL/PLATELET  ETHANOL  ACETAMINOPHEN LEVEL  SALICYLATE LEVEL    EKG None  Radiology No results found.  Procedures Procedures (including critical care time)  Medications Ordered in ED Medications - No data to display   Initial Impression / Assessment and Plan / ED Course  I have reviewed the triage vital signs and the nursing notes.  Pertinent labs & imaging results that were available during my care of the patient were reviewed by me and considered in my medical decision making  (see chart for details).     Patient brought in by family for paranoia and hallucinations.  Patient is calm but anxious, paranoid in ED evaluation.  She has been medically cleared for psychiatric evaluation and treatment.    Final Clinical Impressions(s) / ED Diagnoses   Final diagnoses:  None    ED Discharge Orders    None       Tilden Fossa, MD 09/12/18 2252

## 2018-09-12 NOTE — BH Assessment (Addendum)
Assessment Note  [name] is an 37 y.o. female that presents this date with AH. Patient denies any S/I, H/I or VH. Patient denies any VH although reports active AH stating she is hearing voices. Patient is observed to be drowsy and provides limited history. Patient's mother [name]) is present with patient's permission who provides collateral information. Patient cannot elaborate on content of AH although denies voices are command. Patient is oriented x 4 with prompting although displays some thought blocking as she attempts to render her history. Patient's mother reports patient is currently residing with her father who has observed patient's worsening behaviors over the last two weeks to include brief psychotic episodes that usually self resolve within 24 hours. Patient was last seen on 09/08/18 per chart review, at [facility] for altered mental status presenting with similar symptoms but declined admission on that date. Patient stated per notes that she would follow up with her OP provider the next day. Patient reports she is currently receiving OP services from [facility] where she has been in the harm reduction program since March 2019 to include methadone maintenance and intensive outpatient. Patient reports she was diagnoised with Depression "years ago" although denies any current medication interventions. Patient reports ongoing symptoms of depression to include excessive fatigue and isolating for over one month. Patient reports she has not been able to sleep more than 2 hours a night for the past week despite the fatigue. Patient has a extensive SA history to include prior use of Heroin, Amphetamines, Cocaine and Cannabis although denies current use since she has been in the methadone program. Patient's UDS is negative this date. Per notes, patient presents to the Emergency Department complaining of AH. Patient reports increased anxiety and paranoia. She feels afraid at home. She has  been leaving the house for hours at a time and is not sure what she has been doing. She thinks she might be hallucinating but she is not sure. She has been unable to take her psychiatric medications since starting on methadone. She has a history of prior hospitalizations as well as tardive dyskinesia. She denies any SI, HI but feels very scared. Case was staffed with Shaune Pollack DNP who recommended a inpatient admission to assist with stabilization.   Diagnosis: F33.3 MDD, recurrent episode, with psychotic features, Polysubstance abuse    Past Medical History:  Past Medical History:  Diagnosis Date  . ADHD (attention deficit hyperactivity disorder)   . Anxiety   . Borderline systolic HTN   . Depression   . Heroin abuse (HCC)   . Hypertension   . Kidney stone   . Knee pain, bilateral    - patellofemoral syndrome, followed by Dr. [name] at [facility]  . Migraines   . Polysubstance abuse (HCC)    - Hx ETOH, cocaine, THC, IV heroin, narcotics  . Psychiatric care    Previous BH H&P notes PMHx Schizophrenia, though patient denies this.  . Seizures (HCC)     Past Surgical History:  Procedure Laterality Date  . KNEE SURGERY    . SHOULDER SURGERY      Family History: No family history on file.  Social History:  reports that she has been smoking cigarettes. She has been smoking about 1.00 pack per day. She has never used smokeless tobacco. She reports that she does not drink alcohol or use drugs.  Additional Social History:  Alcohol / Drug Use Pain Medications: See MAR Prescriptions: See MAR Over the Counter: See MAR History of  alcohol / drug use?: Yes(Hx of currently denies pt takes Methadone ) Longest period of sobriety (when/how long): Current Negative Consequences of Use: Financial, Legal(In the past) Withdrawal Symptoms: (Denies)  CIWA: CIWA-Ar BP: (!) 167/104 Pulse Rate: 80 COWS:    Allergies:  Allergies  Allergen Reactions  . Shellfish Allergy Hives    Home Medications:  (Not  in a hospital admission)  OB/GYN Status:  Patient's last menstrual period was 09/12/2018.  General Assessment Data Location of Assessment: [facility] TTS Assessment: In system Is this a Tele or Face-to-Face Assessment?: Face-to-Face Is this an Initial Assessment or a Re-assessment for this encounter?: Initial Assessment Patient Accompanied by:: (Parent) Language Other than English: No Living Arrangements: (With parent) What gender do you identify as?: Female Marital status: Single Maiden name: Bangert Pregnancy Status: No Living Arrangements: Parent(With father) Can pt return to current living arrangement?: Yes Admission Status: Voluntary Is patient capable of signing voluntary admission?: Yes Referral Source: Self/Family/Friend Insurance type: Medicare  Medical Screening Exam [facility] Walk-in ONLY) Medical Exam completed: (Labs pending)  Crisis Care Plan Living Arrangements: Parent(With father) Legal Guardian: (NA) Name of Psychiatrist: None Name of Therapist: None  Education Status Is patient currently in school?: No Is the patient employed, unemployed or receiving disability?: Receiving disability income  Risk to self with the past 6 months Suicidal Ideation: No Has patient been a risk to self within the past 6 months prior to admission? : No Suicidal Intent: No Has patient had any suicidal intent within the past 6 months prior to admission? : No Is patient at risk for suicide?: No, but patient needs Medical Clearance Suicidal Plan?: No Has patient had any suicidal plan within the past 6 months prior to admission? : No Access to Means: No What has been your use of drugs/alcohol within the last 12 months?: NA Previous Attempts/Gestures: No How many times?: 0 Other Self Harm Risks: (Excessive SA use) Triggers for Past Attempts: (NA) Intentional Self Injurious Behavior: None Family Suicide History: No Recent stressful life event(s): Other (Comment)(Increased MH issues off  meds) Persecutory voices/beliefs?: Yes Depression: Yes Depression Symptoms: Isolating Substance abuse history and/or treatment for substance abuse?: Yes Suicide prevention information given to non-admitted patients: Not applicable  Risk to Others within the past 6 months Homicidal Ideation: No Does patient have any lifetime risk of violence toward others beyond the six months prior to admission? : No Thoughts of Harm to Others: No Current Homicidal Intent: No Current Homicidal Plan: No Access to Homicidal Means: No Identified Victim: NA History of harm to others?: No Assessment of Violence: None Noted Violent Behavior Description: NA Does patient have access to weapons?: No Criminal Charges Pending?: No Does patient have a court date: No Is patient on probation?: No  Psychosis Hallucinations: Auditory Delusions: Unspecified  Mental Status Report Appearance/Hygiene: In scrubs Eye Contact: Fair Motor Activity: Freedom of movement Speech: Soft, Slow, Slurred Level of Consciousness: Drowsy Mood: Depressed Affect: Flat Anxiety Level: Moderate Thought Processes: Thought Blocking Judgement: Impaired Orientation: Person, Place, Time Obsessive Compulsive Thoughts/Behaviors: None  Cognitive Functioning Concentration: Decreased Memory: Recent Impaired Is patient IDD: No Insight: Fair Impulse Control: Poor Appetite: Fair Have you had any weight changes? : No Change Sleep: Decreased Total Hours of Sleep: 2 Vegetative Symptoms: None  ADLScreening [facility] Assessment Services) Patient's cognitive ability adequate to safely complete daily activities?: Yes Patient able to express need for assistance with ADLs?: Yes Independently performs ADLs?: Yes (appropriate for developmental age)  Prior Inpatient Therapy Prior Inpatient Therapy: Yes  Prior Therapy Dates: 2019, 2018 Prior Therapy Facilty/Provider(s): [facility], [facility], Risk manager Reason for Treatment: MH issues/SA  issues  Prior Outpatient Therapy Prior Outpatient Therapy: Yes Prior Therapy Dates: Ongoing Prior Therapy Facilty/Provider(s): [facility] Reason for Treatment: Med mang Does patient have an ACCT team?: No Does patient have Intensive In-House Services?  : No Does patient have [facility] services? : No Does patient have P4CC services?: No  ADL Screening (condition at time of admission) Patient's cognitive ability adequate to safely complete daily activities?: Yes Is the patient deaf or have difficulty hearing?: No Does the patient have difficulty seeing, even when wearing glasses/contacts?: No Does the patient have difficulty concentrating, remembering, or making decisions?: No Patient able to express need for assistance with ADLs?: Yes Does the patient have difficulty dressing or bathing?: No Independently performs ADLs?: Yes (appropriate for developmental age) Does the patient have difficulty walking or climbing stairs?: No Weakness of Legs: None Weakness of Arms/Hands: None  Home Assistive Devices/Equipment Home Assistive Devices/Equipment: None  Therapy Consults (therapy consults require a physician order) PT Evaluation Needed: No OT Evalulation Needed: No SLP Evaluation Needed: No Abuse/Neglect Assessment (Assessment to be complete while patient is alone) Physical Abuse: Denies Verbal Abuse: Denies Sexual Abuse: Denies Exploitation of patient/patient's resources: Denies Self-Neglect: Denies Values / Beliefs Cultural Requests During Hospitalization: None Spiritual Requests During Hospitalization: None Consults Spiritual Care Consult Needed: No Social Work Consult Needed: No Merchant navy officer (For Healthcare) Does Patient Have a Medical Advance Directive?: No Would patient like information on creating a medical advance directive?: No - Patient declined          Disposition: Case was staffed with Shaune Pollack DNP who recommended a inpatient admission to assist with stabilization.    Disposition Initial Assessment Completed for this Encounter: Yes Disposition of Patient: Admit Type of inpatient treatment program: Adult Patient refused recommended treatment: No Mode of transportation if patient is discharged?: (Unk)  On Site Evaluation by:   Reviewed with Physician:    Alfredia Ferguson 09/12/2018 5:45 PM

## 2018-09-13 ENCOUNTER — Encounter (HOSPITAL_COMMUNITY): Payer: Self-pay | Admitting: *Deleted

## 2018-09-13 ENCOUNTER — Inpatient Hospital Stay (HOSPITAL_COMMUNITY)
Admission: AD | Admit: 2018-09-13 | Discharge: 2018-09-19 | DRG: 885 | Disposition: A | Discharge status: Home / Self Care | Payer: Medicare Other | Source: Intra-hospital | Attending: Psychiatry | Admitting: Psychiatry

## 2018-09-13 ENCOUNTER — Other Ambulatory Visit: Payer: Self-pay

## 2018-09-13 DIAGNOSIS — Z91013 Allergy to seafood: Secondary | ICD-10-CM

## 2018-09-13 DIAGNOSIS — Z653 Problems related to other legal circumstances: Secondary | ICD-10-CM | POA: Diagnosis not present

## 2018-09-13 DIAGNOSIS — F1120 Opioid dependence, uncomplicated: Secondary | ICD-10-CM | POA: Diagnosis present

## 2018-09-13 DIAGNOSIS — F312 Bipolar disorder, current episode manic severe with psychotic features: Secondary | ICD-10-CM | POA: Diagnosis present

## 2018-09-13 DIAGNOSIS — Z818 Family history of other mental and behavioral disorders: Secondary | ICD-10-CM

## 2018-09-13 DIAGNOSIS — F419 Anxiety disorder, unspecified: Secondary | ICD-10-CM | POA: Diagnosis present

## 2018-09-13 DIAGNOSIS — F17210 Nicotine dependence, cigarettes, uncomplicated: Secondary | ICD-10-CM | POA: Diagnosis present

## 2018-09-13 DIAGNOSIS — Z23 Encounter for immunization: Secondary | ICD-10-CM

## 2018-09-13 DIAGNOSIS — F4310 Post-traumatic stress disorder, unspecified: Secondary | ICD-10-CM | POA: Diagnosis present

## 2018-09-13 DIAGNOSIS — F1190 Opioid use, unspecified, uncomplicated: Secondary | ICD-10-CM | POA: Diagnosis not present

## 2018-09-13 DIAGNOSIS — G4700 Insomnia, unspecified: Secondary | ICD-10-CM | POA: Diagnosis present

## 2018-09-13 DIAGNOSIS — R451 Restlessness and agitation: Secondary | ICD-10-CM | POA: Diagnosis not present

## 2018-09-13 MED ORDER — PNEUMOCOCCAL VAC POLYVALENT 25 MCG/0.5ML IJ INJ
0.5000 mL | INJECTION | INTRAMUSCULAR | Status: AC
  Administered 2018-09-14: 0.5 mL via INTRAMUSCULAR

## 2018-09-13 MED ORDER — HYDROXYZINE HCL 25 MG PO TABS
25.0000 mg | ORAL_TABLET | Freq: Three times a day (TID) | ORAL | Status: DC | PRN
  Administered 2018-09-13: 25 mg via ORAL
  Filled 2018-09-13: qty 1

## 2018-09-13 MED ORDER — NICOTINE 14 MG/24HR TD PT24
14.0000 mg | MEDICATED_PATCH | Freq: Once | TRANSDERMAL | Status: DC
  Filled 2018-09-13: qty 1

## 2018-09-13 MED ORDER — ACETAMINOPHEN 325 MG PO TABS
650.0000 mg | ORAL_TABLET | ORAL | Status: DC | PRN
  Administered 2018-09-13 – 2018-09-19 (×10): 650 mg via ORAL
  Filled 2018-09-13 (×10): qty 2

## 2018-09-13 MED ORDER — TRAZODONE HCL 50 MG PO TABS
50.0000 mg | ORAL_TABLET | Freq: Every evening | ORAL | Status: DC | PRN
  Administered 2018-09-13 – 2018-09-18 (×7): 50 mg via ORAL
  Filled 2018-09-13 (×19): qty 1

## 2018-09-13 MED ORDER — ONDANSETRON HCL 4 MG PO TABS: 4.0000 mg | ORAL_TABLET | Freq: Three times a day (TID) | ORAL | Status: DC | PRN

## 2018-09-13 MED ORDER — NICOTINE 14 MG/24HR TD PT24
14.0000 mg | MEDICATED_PATCH | Freq: Once | TRANSDERMAL | Status: DC
  Administered 2018-09-13: 14 mg via TRANSDERMAL
  Filled 2018-09-13: qty 1

## 2018-09-13 MED ORDER — ALUM & MAG HYDROXIDE-SIMETH 200-200-20 MG/5ML PO SUSP: 30.0000 mL | Freq: Four times a day (QID) | ORAL | Status: DC | PRN

## 2018-09-13 MED ORDER — INFLUENZA VAC SPLIT QUAD 0.5 ML IM SUSY
0.5000 mL | PREFILLED_SYRINGE | INTRAMUSCULAR | Status: AC
  Administered 2018-09-14: 0.5 mL via INTRAMUSCULAR
  Filled 2018-09-13: qty 0.5

## 2018-09-13 MED ORDER — MAGNESIUM HYDROXIDE 400 MG/5ML PO SUSP
30.0000 mL | Freq: Every day | ORAL | Status: DC | PRN
  Administered 2018-09-17 – 2018-09-18 (×2): 30 mL via ORAL
  Filled 2018-09-13 (×2): qty 30

## 2018-09-13 MED ORDER — METHADONE HCL 10 MG/ML PO CONC
99.0000 mg | Freq: Every day | ORAL | Status: DC
  Administered 2018-09-13: 99 mg via ORAL
  Filled 2018-09-13: qty 9.9

## 2018-09-13 NOTE — ED Notes (Addendum)
Pt escorted to room 34.   Pt is calm and cooperative this morning.  Pt was oriented to unit and explained the rules.  15 minute checks and video monitoring in place.  Pt denies S/I , H/I, and AVH.

## 2018-09-13 NOTE — ED Notes (Signed)
Pt discharged safely with [place] driver.  All belongings were sent with pt.  Pt was calm and cooperative.

## 2018-09-13 NOTE — ED Notes (Signed)
Bed: [facility]34 Expected date:  Expected time:  Means of arrival:  Comments: Hold for 31 

## 2018-09-13 NOTE — Progress Notes (Signed)
Patient ID: [name], female   DOB: 03-May-1981, 37 y.o.   MRN: [id number] PER STATE REGULATIONS 482.30  THIS CHART WAS REVIEWED FOR MEDICAL NECESSITY WITH RESPECT TO THE PATIENT'S ADMISSION/DURATION OF STAY.  NEXT REVIEW DATE:09/17/18  Loura Halt, RN, BSN CASE MANAGER

## 2018-09-13 NOTE — ED Notes (Addendum)
Pt awake and active in room. Respirations even and unlabored. No distress noted. Will continue to monitor.

## 2018-09-13 NOTE — Progress Notes (Signed)
[name] is a 37 year old female pt admitted on voluntary basis. On admission, she appears fidgety with tangential thought process. She reports that she came to the hospital because she is having difficulty sleeping and reports that she is becoming manic and delusional and needed to come into the hospital. She does report a past history of opiate abuse and reports that she has been taking methadone since March. She also reports that she was getting a benzo prescribed for her but the methadone clinic will not allow her to take both the benzo and methadone. She reports that she was at [facility] a few months ago and reports that she was on medications there that she said were helpful but reports that they would not continue them for her. When asked about them she reports that she goes to [facility] and sees Dr. Betti Cruz. She reports that she would like to get stabilized and back on medications but reports that there are some antipsychotics that she can not take and reports that she has tardive dyskinesia. She denies any SI at this time and is able to contract for safety on the unit. She reports that she lives with her father and reports that she will return there once she is discharged. [name] was escorted to the unit, oriented to the milieu and safety maintained.

## 2018-09-13 NOTE — Tx Team (Signed)
Initial Treatment Plan 09/13/2018 2:12 PM [name] BJY:[id number]    PATIENT STRESSORS: Marital or family conflict Medication change or noncompliance   PATIENT STRENGTHS: Ability for insight Average or above average intelligence Capable of independent living General fund of knowledge Motivation for treatment/growth   PATIENT IDENTIFIED PROBLEMS: Anxiety  Mania "I can't sleep" "I need to get stabilized and find some medications"                     DISCHARGE CRITERIA:  Ability to meet basic life and health needs Improved stabilization in mood, thinking, and/or behavior Verbal commitment to aftercare and medication compliance  PRELIMINARY DISCHARGE PLAN: Attend aftercare/continuing care group Return to previous living arrangement  PATIENT/FAMILY INVOLVEMENT: This treatment plan has been presented to and reviewed with the patient, [name], and/or family member, .  The patient and family have been given the opportunity to ask questions and make suggestions.  [name], [name], [place] 09/13/2018, 2:12 PM

## 2018-09-13 NOTE — Progress Notes (Signed)
Per Dr. Sharma Covert and Malachy Chamber, NP patient continues to require in patient hospitalization. TTS to seek placement.

## 2018-09-13 NOTE — BH Assessment (Signed)
[facility] Assessment Progress Note  Per Juanetta Beets, DO, this pt requires psychiatric hospitalization at this time.  Malva Limes, RN, [facility] has assigned pt to [facility] Rm [id number]; [facility] will be ready to receive pt at 13:00.  Pt has reluctantly signed Voluntary Admission and Consent for Treatment, and signed forms have been faxed to [facility].  Pt's nurse, Kendal Hymen, has been notified, and agrees to send original paperwork along with pt via Juel Burrow, and to call report to [phone].  Doylene Canning, [place] [facility] Coordinator [phone]

## 2018-09-13 NOTE — ED Notes (Addendum)
Pt reporting agitation and requesting medication. Provider notified.

## 2018-09-14 DIAGNOSIS — F312 Bipolar disorder, current episode manic severe with psychotic features: Principal | ICD-10-CM

## 2018-09-14 DIAGNOSIS — F4310 Post-traumatic stress disorder, unspecified: Secondary | ICD-10-CM

## 2018-09-14 LAB — LIPID PANEL
Cholesterol: 162 mg/dL (ref 0–200)
HDL: 47 mg/dL (ref >40)
LDL Cholesterol: 86 mg/dL (ref 0–99)
TRIGLYCERIDES: 146 mg/dL (ref <150)
Total CHOL/HDL Ratio: 3.4 RATIO
VLDL: 29 mg/dL (ref 0–40)

## 2018-09-14 LAB — TSH: TSH: 1.141 u[IU]/mL (ref 0.350–4.500)

## 2018-09-14 LAB — HEMOGLOBIN A1C
HEMOGLOBIN A1C: 5 % (ref 4.8–5.6)
MEAN PLASMA GLUCOSE: 96.8 mg/dL

## 2018-09-14 MED ORDER — SERTRALINE HCL 50 MG PO TABS
50.0000 mg | ORAL_TABLET | Freq: Every day | ORAL | Status: DC
  Administered 2018-09-14 – 2018-09-19 (×6): 50 mg via ORAL
  Filled 2018-09-14 (×10): qty 1

## 2018-09-14 MED ORDER — DIVALPROEX SODIUM 250 MG PO DR TAB
250.0000 mg | DELAYED_RELEASE_TABLET | Freq: Two times a day (BID) | ORAL | Status: DC
  Administered 2018-09-14 – 2018-09-15 (×3): 250 mg via ORAL
  Filled 2018-09-14 (×8): qty 1

## 2018-09-14 MED ORDER — HYDROXYZINE HCL 50 MG PO TABS
50.0000 mg | ORAL_TABLET | Freq: Four times a day (QID) | ORAL | Status: DC | PRN
  Administered 2018-09-14 – 2018-09-19 (×6): 50 mg via ORAL
  Filled 2018-09-14 (×6): qty 1

## 2018-09-14 MED ORDER — METHADONE HCL 5 MG PO TABS
97.5000 mg | ORAL_TABLET | Freq: Every day | ORAL | Status: DC
  Administered 2018-09-14 – 2018-09-19 (×6): 97.5 mg via ORAL
  Filled 2018-09-14: qty 9
  Filled 2018-09-14: qty 2
  Filled 2018-09-14 (×3): qty 9
  Filled 2018-09-14: qty 2

## 2018-09-14 NOTE — [facility] Group Notes (Signed)
LCSW Group Therapy Note  09/14/2018 1:15pm  Type of Therapy/Topic:  Group Therapy:  Emotion Regulation  Participation Level:  Did Not Attend     Ida Rogue, LCSW 09/14/2018 10:20 AM

## 2018-09-14 NOTE — Progress Notes (Signed)
Pt was observed in the dayroom, seen eating a snack. Pt attended wrap-up group. Pt appears flat/anxious in affect and mood. Pt denies SI/HI/AVH at this time. Pt was receptive with interaction. PRN trazodone, vistaril, and tylenol requested and given. Support and encouragement provided. Will contiunue with POC .
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
## 2018-09-14 NOTE — Progress Notes (Signed)
D:  [name] mainly stayed in her room this evening. She did not attend evening wrap up group.  She denied SI/HI or A/V hallucinations.  She questioned her home medications (worried about her methadone dose that she takes early in the morning) and encouraged her to talk with the doctor tomorrow about the plan.  She requested something for sleep and new orders for trazodone obtained.  She took her hs medications without difficulty.  She requested tylenol for leg pain with good relief as well.   She is currently resting with her eyes closed and appears to be asleep. A:  1:1 with RN for support and encouragement.  Medications as ordered.  Q 15 minute checks maintained for safety.  Encouraged participation in group and unit activities.   R:  [name] remains safe on the unit.  We will continue to monitor the progress towards her goals.

## 2018-09-14 NOTE — Progress Notes (Signed)
Patient described her day as having been "okay" but did not go into further detail. Her goal for tomorrow is to attend more of the groups.

## 2018-09-14 NOTE — [facility] Suicide Risk Assessment (Signed)
[facility] Admission Suicide Risk Assessment   Nursing information obtained from:  Patient Demographic factors:  Caucasian Current Mental Status:  NA Loss Factors:  NA Historical Factors:  Family history of mental illness or substance abuse Risk Reduction Factors:  Living with another person, especially a relative, Positive coping skills or problem solving skills  Total Time spent with patient: 1 hour Principal Problem: Bipolar affective disorder, current episode manic with psychotic symptoms (HCC) Diagnosis:   Patient Active Problem List   Diagnosis Date Noted  . PTSD (post-traumatic stress disorder) [F43.10] 09/14/2018  . Bipolar affective disorder, current episode manic with psychotic symptoms (HCC) [F31.2] 09/13/2018  . Soft tissue infection [L08.9] 08/10/2017  . Polysubstance dependence including opioid type drug, episodic abuse (HCC) [F11.20, F19.20] 06/27/2017  . Substance induced mood disorder (HCC) [F19.94] 06/27/2017  . Anxiety [F41.9]   . Persistent recurrent vomiting [R11.15] 08/26/2013  . Dehydration [E86.0] 08/26/2013  . Acute gastroenteritis [K52.9] 08/26/2013  . Unspecified essential hypertension [I10] 08/26/2013  . Right knee pain [M25.561] 06/27/2013  . URI (upper respiratory infection) [J06.9] 12/02/2012  . Carpal tunnel syndrome, bilateral [G56.03] 04/21/2011  . Vaginitis and vulvovaginitis [616.1] 04/21/2011  . ANXIETY DEPRESSION [F34.1] 08/20/2010  . SUBSTANCE ABUSE, MULTIPLE [F19.10] 08/20/2010  . ADHD [F90.9] 08/20/2010  . UTI [N39.0] 08/20/2010  . 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 DYSURIA [R30.0] 08/20/2010  . ELEVATED BLOOD PRESSURE WITHOUT DIAGNOSIS OF HYPERTENSION [R03.0] 08/20/2010   Subjective Data: see H&P  Continued Clinical Symptoms:  Alcohol Use Disorder Identification Test Final Score (AUDIT): 0 The "Alcohol Use Disorders Identification Test", Guidelines for Use in Primary Care, Second Edition.  World Science writer  [facility]). Score between 0-7:  no or low risk or alcohol related problems. Score between 8-15:  moderate risk of alcohol related problems. Score between 16-19:  high risk of alcohol related problems. Score 20 or above:  warrants further diagnostic evaluation for alcohol dependence and treatment.    Psychiatric Specialty Exam: Physical Exam  Nursing note and vitals reviewed.     Blood pressure (!) 152/94, pulse (!) 102, temperature (!) 97.1 F (36.2 C), temperature source Oral, resp. rate 20, height 5\' 6"  (1.676 m), weight 77.1 kg, last menstrual period 09/12/2018.Body mass index is 27.44 kg/m.    COGNITIVE FEATURES THAT CONTRIBUTE TO RISK:  None    SUICIDE RISK:   Minimal: No identifiable suicidal ideation.  Patients presenting with no risk factors but with morbid ruminations; may be classified as minimal risk based on the severity of the depressive symptoms  PLAN OF CARE: see H&P  I certify that inpatient services furnished can reasonably be expected to improve the patient's condition.   Micheal Likens, MD 09/14/2018, 2:32 PM

## 2018-09-14 NOTE — Progress Notes (Signed)
Patient denies SI, HI and AVH.  Patient has been isolative this shift.   Assess patient for safety, offer medications as prescribed, engage patient in 1:1 staff talks.   Patient able to contract for safety, continue to monitor as planned.

## 2018-09-14 NOTE — Progress Notes (Signed)
Recreation Therapy Notes  INPATIENT RECREATION THERAPY ASSESSMENT  Patient Details Name: [name] MRN: [id number] DOB: 07-25-1981 Today's Date: 09/14/2018       Information Obtained From: Patient  Able to Participate in Assessment/Interview: Yes  Patient Presentation: Alert  Reason for Admission (Per Patient): Other (Comments)(Manic behavior)  Patient Stressors: Other (Comment)(Depends on dad to get her to and from appointments because [place] bus [place])  Coping Skills:   Film/video editor, Journal, Sports, Arguments, Music, Meditate, Exercise, Deep Breathing, Substance Abuse, Impulsivity, Talk, Prayer, Avoidance, Read, Hot Bath/Shower  Leisure Interests (2+):  Individual - Journaling, Music - Listen  Frequency of Recreation/Participation: Other (Comment)(Daily)  Awareness of Community Resources:  Yes  Community Resources:  Park, Engineering geologist, Other (Comment)(Resource center)  Current Use: Yes  If no, Barriers?:    Expressed Interest in [employer] Information: Yes  [place] of Residence:  [place]  Patient Main Form of Transportation: Other (Comment)(Dad)  Patient Strengths:  Nice; Compassionate  Patient Identified Areas of Improvement:  Being around people  Patient Goal for Hospitalization:  "Learn new coping skills"  Current SI (including self-harm):  No  Current HI:  No  Current AVH: No  Staff Intervention Plan: Group Attendance, Collaborate with Interdisciplinary Treatment Team  Consent to Intern Participation: N/A    Caroll Rancher, LRT/CTRS  Caroll Rancher A 09/14/2018, 1:37 PM

## 2018-09-14 NOTE — Tx Team (Signed)
Interdisciplinary Treatment and Diagnostic Plan Update  09/14/2018 Time of Session: 11:35 AM  [name] MRN: [id number]  Principal Diagnosis: <principal problem not specified>  Secondary Diagnoses: Active Problems:   Bipolar affective disorder, current episode manic with psychotic symptoms (HCC)   Current Medications:  Current Facility-Administered Medications  Medication Dose Route Frequency Provider Last Rate Last Dose  . acetaminophen (TYLENOL) tablet 650 mg  650 mg Oral Q4H PRN Suella Broad, FNP   650 mg at 09/13/18 2138  . alum & mag hydroxide-simeth (MAALOX/MYLANTA) 200-200-20 MG/5ML suspension 30 mL  30 mL Oral Q6H PRN Starkes-Perry, Gayland Curry, FNP      . divalproex (DEPAKOTE) DR tablet 250 mg  250 mg Oral BID Pennelope Bracken, MD      . hydrOXYzine (ATARAX/VISTARIL) tablet 50 mg  50 mg Oral Q6H PRN Pennelope Bracken, MD      . Influenza vac split quadrivalent PF (FLUARIX) injection 0.5 mL  0.5 mL Intramuscular Tomorrow-1000 [place], Randa Ngo, MD      . magnesium hydroxide (MILK OF MAGNESIA) suspension 30 mL  30 mL Oral Daily PRN Starkes-Perry, Gayland Curry, FNP      . methadone (DOLOPHINE) tablet 97.5 mg  97.5 mg Oral Daily Rainville, Christopher T, MD      . nicotine (NICODERM CQ - dosed in mg/24 hours) patch 14 mg  14 mg Transdermal Once Suella Broad, FNP      . ondansetron (ZOFRAN) tablet 4 mg  4 mg Oral Q8H PRN Suella Broad, FNP      . pneumococcal 23 valent vaccine (PNU-IMMUNE) injection 0.5 mL  0.5 mL Intramuscular Tomorrow-1000 Maris Berger T, MD      . sertraline (ZOLOFT) tablet 50 mg  50 mg Oral Daily Pennelope Bracken, MD      . traZODone (DESYREL) tablet 50 mg  50 mg Oral QHS,MR X 1 Lindon Romp A, NP   50 mg at 09/13/18 2139    PTA Medications: Medications Prior to Admission  Medication Sig Dispense Refill Last Dose  . methadone (DOLOPHINE) 10 MG/ML solution Take 99 mg by mouth daily.   09/12/2018  at Unknown time    Patient Stressors: Marital or family conflict Medication change or noncompliance  Patient Strengths: Ability for insight Average or above average intelligence Capable of independent living [employer] of knowledge Motivation for treatment/growth  Treatment Modalities: Medication Management, Group therapy, Case management,  1 to 1 session with clinician, Psychoeducation, Recreational therapy.   Physician Treatment Plan for Primary Diagnosis: <principal problem not specified> Long Term Goal(s): Improvement in symptoms so as ready for discharge  Short Term Goals:    Medication Management: Evaluate patient's response, side effects, and tolerance of medication regimen.  Therapeutic Interventions: 1 to 1 sessions, Unit Group sessions and Medication administration.  Evaluation of Outcomes: Progressing  Physician Treatment Plan for Secondary Diagnosis: Active Problems:   Bipolar affective disorder, current episode manic with psychotic symptoms (Intercourse)   Long Term Goal(s): Improvement in symptoms so as ready for discharge  Short Term Goals:    Medication Management: Evaluate patient's response, side effects, and tolerance of medication regimen.  Therapeutic Interventions: 1 to 1 sessions, Unit Group sessions and Medication administration.  Evaluation of Outcomes: Progressing   RN Treatment Plan for Primary Diagnosis: <principal problem not specified> Long Term Goal(s): Knowledge of disease and therapeutic regimen to maintain health will improve  Short Term Goals: Ability to identify and develop effective coping behaviors will improve and Compliance with  prescribed medications will improve  Medication Management: RN will administer medications as ordered by provider, will assess and evaluate patient's response and provide education to patient for prescribed medication. RN will report any adverse and/or side effects to prescribing provider.  Therapeutic  Interventions: 1 on 1 counseling sessions, Psychoeducation, Medication administration, Evaluate responses to treatment, Monitor vital signs and CBGs as ordered, Perform/monitor CIWA, COWS, AIMS and Fall Risk screenings as ordered, Perform wound care treatments as ordered.  Evaluation of Outcomes: Progressing   LCSW Treatment Plan for Primary Diagnosis: <principal problem not specified> Long Term Goal(s): Safe transition to appropriate next level of care at discharge, Engage patient in therapeutic group addressing interpersonal concerns.  Short Term Goals: Engage patient in aftercare planning with referrals and resources  Therapeutic Interventions: Assess for all discharge needs, 1 to 1 time with Social worker, Explore available resources and support systems, Assess for adequacy in community support network, Educate family and significant other(s) on suicide prevention, Complete Psychosocial Assessment, Interpersonal group therapy.  Evaluation of Outcomes: Met  Return home, follow up outpt   Progress in Treatment: Attending groups: Yes Participating in groups: Yes Taking medication as prescribed: Yes Toleration medication: Yes, no side effects reported at this time Family/Significant other contact made: No Patient understands diagnosis: No Limited insight  Discussing patient identified problems/goals with staff: Yes Medical problems stabilized or resolved: Yes Denies suicidal/homicidal ideation: Yes Issues/concerns per patient self-inventory: None Other: N/A  New problem(s) identified: None identified at this time.   New Short Term/Long Term Goal(s): No goals   Discharge Plan or Barriers:   Reason for Continuation of Hospitalization:  Medication stabilization   Estimated Length of Stay: 10/14  Attendees: Patient: [name] 09/14/2018  11:35 AM  Physician: Maris Berger, MD 09/14/2018  11:35 AM  Nursing: Elesa Massed, RN 09/14/2018  11:35 AM  RN Care Manager:  Lars Pinks, RN 09/14/2018  11:35 AM  Social Worker: Ripley Fraise 09/14/2018  11:35 AM  Recreational Therapist: Winfield Cunas 09/14/2018  11:35 AM  Other: Norberto Sorenson 09/14/2018  11:35 AM  Other:  09/14/2018  11:35 AM    Scribe for Treatment Team:  Roque Lias LCSW 09/14/2018 11:35 AM
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
## 2018-09-14 NOTE — Progress Notes (Signed)
Recreation Therapy Notes  Date: 10.9.19 Time: 1000 Location: 500 Hall Dayroom  Group Topic: Self-Esteem  Goal Area(s) Addresses:  Patient will successfully identify positive attributes about themselves.  Patient will successfully identify benefit of improved self-esteem.   Intervention: Construction paper, scissors, glue sticks, magazines, music  Activity: Collage.  Patients were to use the supplies provided to create [name] collage that highlighted the good things about them.  Patients could also focus on accomplishments or places they want go or activities they want to try in the future.  Education:  Self-Esteem, Discharge Planning.   Education Outcome: Acknowledges education/In group clarification offered/Needs additional education  Clinical Observations/Feedback: Pt did not attend group.    [name], LRT/CTRS         [name] 09/14/2018 12:18 PM 

## 2018-09-15 MED ORDER — LORAZEPAM 1 MG PO TABS: 2.0000 mg | ORAL_TABLET | Freq: Four times a day (QID) | ORAL | Status: DC | PRN

## 2018-09-15 MED ORDER — LORAZEPAM 2 MG/ML IJ SOLN: 2.0000 mg | Freq: Four times a day (QID) | INTRAMUSCULAR | Status: DC | PRN

## 2018-09-15 MED ORDER — DIVALPROEX SODIUM 250 MG PO DR TAB
250.0000 mg | DELAYED_RELEASE_TABLET | ORAL | Status: DC
  Administered 2018-09-16 – 2018-09-19 (×4): 250 mg via ORAL
  Filled 2018-09-15 (×7): qty 1

## 2018-09-15 MED ORDER — DIVALPROEX SODIUM 500 MG PO DR TAB
500.0000 mg | DELAYED_RELEASE_TABLET | Freq: Every day | ORAL | Status: DC
  Administered 2018-09-15 – 2018-09-18 (×4): 500 mg via ORAL
  Filled 2018-09-15 (×7): qty 1

## 2018-09-15 NOTE — [facility] Group Notes (Signed)
LCSW Group Therapy Note  09/15/2018 1:15pm  Type of Therapy/Topic:  Group Therapy:  Balance in Life  Participation Level:  Active  Description of Group:    This group will address the concept of balance and how it feels and looks when one is unbalanced. Patients will be encouraged to process areas in their lives that are out of balance and identify reasons for remaining unbalanced. Facilitators will guide patients in utilizing problem-solving interventions to address and correct the stressor making their life unbalanced. Understanding and applying boundaries will be explored and addressed for obtaining and maintaining a balanced life. Patients will be encouraged to explore ways to assertively make their unbalanced needs known to significant others in their lives, using other group members and facilitator for support and feedback.  Therapeutic Goals: 1. Patient will identify two or more emotions or situations they have that consume much of in their lives. 2. Patient will identify signs/triggers that life has become out of balance:  3. Patient will identify two ways to set boundaries in order to achieve balance in their lives:  4. Patient will demonstrate ability to communicate their needs through discussion and/or role plays  Summary of Patient Progress:  Stayed the entire time, engaged throughout.  Mood neutral.  Contributed to discussion.  Focused on Dr's and how, once you have the reputation of "medication seeking", they will not prescribe any meds that are needed.    Therapeutic Modalities:   Cognitive Behavioral Therapy Solution-Focused Therapy Assertiveness Training  Ida Rogue, [place] 09/15/2018 3:45 PM

## 2018-09-15 NOTE — Plan of Care (Signed)
  Problem: Activity: Goal: Interest or engagement in activities will improve Outcome: Not Progressing   Problem: Safety: Goal: Periods of time without injury will increase Outcome: Progressing  DAR NOTE: Patient presents with flat affect and depressed mood.  Denies suicidal thoughts, pain, auditory and visual hallucinations.  Described energy level as normal and concentration as poor.  Rates depression at 5, hopelessness at 0, and anxiety at 5.  Maintained on routine safety checks.  Medications given as prescribed.  Support and encouragement offered as needed.  Attended group and participated.  Patient remained withdrawn and isolates to her room.  No interaction with staff and peers. Offered no complaint.

## 2018-09-15 NOTE — Progress Notes (Signed)
[facility] MD Progress Note  09/15/2018 1:50 PM [name]  MRN:  [id number] Subjective:    [name] is a 37 y/o F with history of bipolar, PTSD, and opioid use disorder (now in remission on methadone maintenance therapy) who was admitted voluntarily from [facility] with worsening symptoms of mood lability, decreased need for sleep with no sleep for at least 2 days, disorganized/bizarrre behaviors such as regressing into child-like state, and agitation such as running out of the house yelling and banging on neighbor's doors. Pt had presented to [facility] on 10/3 with similar symptoms, and she was discharged to outpatient level of care, but she continued to have worsening symptoms. Collateral information suggests that pt only takes methadone as her sole outpatient medication, and the dose was recently increased. Pt was medically cleared and then transferred to [facility] for additional treatment and stabilization. She was resumed on home dose of methadone and she was started on trial of depakote and zoloft to address her mood symptoms.'  Today upon evaluation, pt shares, "I'm doing good - I slept a lot better than I have been." She denies any specific concerns. She is sleeping well. Her appetite is good. She denies other physical complaints, and she specifically denies any physical complaints that she associates with previous history of tardive dyskinesia. She denies SI/HI/AH/VH. She is tolerating her medications well. We discussed increasing dose of depakote in the evening, and then planning for a depakote level in 4 days. Pt was in agreement with the above plan, and she had no further questions, comments, or concerns.   Principal Problem: Bipolar affective disorder, current episode manic with psychotic symptoms (HCC) Diagnosis:   Patient Active Problem List   Diagnosis Date Noted  . PTSD (post-traumatic stress disorder) [F43.10] 09/14/2018  . Bipolar affective disorder, current episode manic with psychotic  symptoms (HCC) [F31.2] 09/13/2018  . Soft tissue infection [L08.9] 08/10/2017  . Polysubstance dependence including opioid type drug, episodic abuse (HCC) [F11.20, F19.20] 06/27/2017  . Substance induced mood disorder (HCC) [F19.94] 06/27/2017  . Anxiety [F41.9]   . Persistent recurrent vomiting [R11.15] 08/26/2013  . Dehydration [E86.0] 08/26/2013  . Acute gastroenteritis [K52.9] 08/26/2013  . Unspecified essential hypertension [I10] 08/26/2013  . Right knee pain [M25.561] 06/27/2013  . URI (upper respiratory infection) [J06.9] 12/02/2012  . Carpal tunnel syndrome, bilateral [G56.03] 04/21/2011  . Vaginitis and vulvovaginitis [616.1] 04/21/2011  . ANXIETY DEPRESSION [F34.1] 08/20/2010  . SUBSTANCE ABUSE, MULTIPLE [F19.10] 08/20/2010  . ADHD [F90.9] 08/20/2010  . UTI [N39.0] 08/20/2010  . 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 DYSURIA [R30.0] 08/20/2010  . ELEVATED BLOOD PRESSURE WITHOUT DIAGNOSIS OF HYPERTENSION [R03.0] 08/20/2010   Total Time spent with patient: 30 minutes  Past Psychiatric History: see H&P  Past Medical History:  Past Medical History:  Diagnosis Date  . ADHD (attention deficit hyperactivity disorder)   . Anxiety   . Borderline systolic HTN   . Depression   . Heroin abuse (HCC)   . Hypertension   . Kidney stone   . Knee pain, bilateral    - patellofemoral syndrome, followed by Dr. [name] at [facility]  . Migraines   . Polysubstance abuse (HCC)    - Hx ETOH, cocaine, THC, IV heroin, narcotics  . Psychiatric care    Previous [facility] H&P notes PMHx Schizophrenia, though patient denies this.  . Seizures (HCC)     Past Surgical History:  Procedure Laterality Date  . KNEE SURGERY    . SHOULDER SURGERY     Family  History: History reviewed. No pertinent family history. Family Psychiatric  History: see H&P Social History:  Social History   Substance and Sexual Activity  Alcohol Use No     Social History   Substance and Sexual Activity  Drug Use No   . Types: IV   Comment: none for 8 days    Social History   Socioeconomic History  . Marital status: Single    Spouse name: Not on file  . Number of children: Not on file  . Years of education: Not on file  . Highest education level: Not on file  Occupational History  . Not on file  Social Needs  . Financial resource strain: Not on file  . Food insecurity:    Worry: Not on file    Inability: Not on file  . Transportation needs:    Medical: Not on file    Non-medical: Not on file  Tobacco Use  . Smoking status: Current Every Day Smoker    Packs/day: 1.00    Types: Cigarettes  . Smokeless tobacco: Never Used  Substance and Sexual Activity  . Alcohol use: No  . Drug use: No    Types: IV    Comment: none for 8 days  . Sexual activity: Yes    Birth control/protection: None  Lifestyle  . Physical activity:    Days per week: Not on file    Minutes per session: Not on file  . Stress: Not on file  Relationships  . Social connections:    Talks on phone: Not on file    Gets together: Not on file    Attends religious service: Not on file    Active member of club or organization: Not on file    Attends meetings of clubs or organizations: Not on file    Relationship status: Not on file  Other Topics Concern  . Not on file  Social History Narrative  . Not on file   Additional Social History:                         Sleep: Good  Appetite:  Good  Current Medications: Current Facility-Administered Medications  Medication Dose Route Frequency Provider Last Rate Last Dose  . acetaminophen (TYLENOL) tablet 650 mg  650 mg Oral Q4H PRN Maryagnes Amos, FNP   650 mg at 09/14/18 2041  . alum & mag hydroxide-simeth (MAALOX/MYLANTA) 200-200-20 MG/5ML suspension 30 mL  30 mL Oral Q6H PRN Rosario Adie, Juel Burrow, FNP      . [START ON 09/16/2018] divalproex (DEPAKOTE) DR tablet 250 mg  250 mg Oral BH-q7a Micheal Likens, MD       And  . divalproex  (DEPAKOTE) DR tablet 500 mg  500 mg Oral QHS Micheal Likens, MD      . hydrOXYzine (ATARAX/VISTARIL) tablet 50 mg  50 mg Oral Q6H PRN Micheal Likens, MD   50 mg at 09/14/18 2041  . LORazepam (ATIVAN) tablet 2 mg  2 mg Oral Q6H PRN Micheal Likens, MD       Or  . LORazepam (ATIVAN) injection 2 mg  2 mg Intramuscular Q6H PRN Micheal Likens, MD      . magnesium hydroxide (MILK OF MAGNESIA) suspension 30 mL  30 mL Oral Daily PRN Starkes-Perry, Juel Burrow, FNP      . methadone (DOLOPHINE) tablet 97.5 mg  97.5 mg Oral Daily Micheal Likens, MD   97.5 mg at 09/15/18  1610  . nicotine (NICODERM CQ - dosed in mg/24 hours) patch 14 mg  14 mg Transdermal Once Maryagnes Amos, FNP      . ondansetron (ZOFRAN) tablet 4 mg  4 mg Oral Q8H PRN Maryagnes Amos, FNP      . sertraline (ZOLOFT) tablet 50 mg  50 mg Oral Daily Micheal Likens, MD   50 mg at 09/15/18 9604  . traZODone (DESYREL) tablet 50 mg  50 mg Oral QHS,MR X 1 [name] A, NP   50 mg at 09/14/18 2041    Lab Results:  Results for orders placed or performed during the hospital encounter of 09/13/18 (from the past 48 hour(s))  Hemoglobin A1c     Status: None   Collection Time: 09/14/18  6:34 AM  Result Value Ref Range   Hgb A1c MFr Bld 5.0 4.8 - 5.6 %    Comment: (NOTE) Pre diabetes:          5.7%-6.4% Diabetes:              >6.4% Glycemic control for   <7.0% adults with diabetes    Mean Plasma Glucose 96.8 mg/dL    Comment: Performed at [facility], [address] [place], [place] [address]  Lipid panel     Status: None   Collection Time: 09/14/18  6:34 AM  Result Value Ref Range   Cholesterol 162 0 - 200 mg/dL   Triglycerides 119 <147 mg/dL   HDL 47 >82 mg/dL   Total CHOL/HDL Ratio 3.4 RATIO   VLDL 29 0 - 40 mg/dL   LDL Cholesterol 86 0 - 99 mg/dL    Comment:        Total Cholesterol/HDL:CHD Risk Coronary Heart Disease Risk Table                     Men    Women  1/2 Average Risk   3.4   3.3  Average Risk       5.0   4.4  2 X Average Risk   9.6   7.1  3 X Average Risk  23.4   11.0        Use the calculated Patient Ratio above and the CHD Risk Table to determine the patient's CHD Risk.        ATP III CLASSIFICATION (LDL):  <100     mg/dL   Optimal  956-213  mg/dL   Near or Above                    Optimal  130-159  mg/dL   Borderline  086-578  mg/dL   High  >469     mg/dL   Very High Performed at [facility], [address] [place], [place] [address]   TSH     Status: None   Collection Time: 09/14/18  6:34 AM  Result Value Ref Range   TSH 1.141 0.350 - 4.500 uIU/mL    Comment: Performed by a 3rd Generation assay with a functional sensitivity of <=0.01 uIU/mL. Performed at [facility], [address] [place], [place] [address]     Blood Alcohol level:  Lab Results  Component Value Date   [facility] <10 09/12/2018   ETH <10 09/09/2018    Metabolic Disorder Labs: Lab Results  Component Value Date   HGBA1C 5.0 09/14/2018   MPG 96.8 09/14/2018   No results found for: PROLACTIN Lab Results  Component Value Date   CHOL 162 09/14/2018   TRIG 146 09/14/2018   HDL 47 09/14/2018   CHOLHDL 3.4 09/14/2018   VLDL 29 09/14/2018   LDLCALC 86 09/14/2018    Physical Findings: AIMS: Facial and Oral Movements Muscles of Facial Expression: None, normal Lips and Perioral Area: None, normal Jaw: None, normal Tongue: None, normal,Extremity Movements Upper (arms, wrists, hands, fingers): None, normal Lower (legs, knees, ankles, toes): None, normal, Trunk Movements Neck, shoulders, hips: None, normal, Overall Severity Severity of abnormal movements (highest score from questions above): None, normal Incapacitation due to abnormal movements: None, normal Patient's awareness of abnormal movements (rate only patient's report): No Awareness, Dental Status Current problems with teeth and/or dentures?:  No Does patient usually wear dentures?: No  CIWA:    COWS:     Musculoskeletal: Strength & Muscle Tone: within normal limits Gait & Station: normal Patient leans: N/A  Psychiatric Specialty Exam: Physical Exam  Nursing note and vitals reviewed.   Review of Systems  Constitutional: Negative for chills and fever.  Respiratory: Negative for cough and shortness of breath.   Cardiovascular: Negative for chest pain.  Gastrointestinal: Negative for abdominal pain, heartburn, nausea and vomiting.  Psychiatric/Behavioral: Negative for depression, hallucinations and suicidal ideas. The patient is not nervous/anxious and does not have insomnia.     Blood pressure (!) 126/101, pulse 94, temperature 98.8 F (37.1 C), temperature source Oral, resp. rate 18, height 5\' 6"  (1.676 m), weight 77.1 kg, last menstrual period 09/12/2018.Body mass index is 27.44 kg/m.  General Appearance: Casual  Eye Contact:  Good  Speech:  Clear and Coherent and Normal Rate  Volume:  Normal  Mood:  Euthymic  Affect:  Congruent, Depressed and Flat  Thought Process:  Coherent and Goal Directed  Orientation:  Full (Time, Place, and Person)  Thought Content:  Logical  Suicidal Thoughts:  No  Homicidal Thoughts:  No  Memory:  Immediate;   Fair Recent;   Fair Remote;   Fair  Judgement:  Fair  Insight:  Fair  Psychomotor Activity:  Normal  Concentration:  Concentration: Fair  Recall:  [employer] of Knowledge:  Fair  Language:  Fair  Akathisia:  No  Handed:    AIMS (if indicated):     Assets:  Resilience Social Support  ADL's:  Intact  Cognition:  WNL  Sleep:  Number of Hours: 6.75     Treatment Plan Summary: Daily contact with patient to assess and evaluate symptoms and progress in treatment and Medication management   -Continue inpatient hospitalization  -Bipolar I, current episode manic with psychosis and PTSD   -Continue zoloft 50mg  po qDay   -Change depakote DR 250mg  po BID to depakote DR 250mg   po qAM + 500mg  po qhs   -Depakote level on 09/18/18  -anxiety   -Continue vistaril 50mg  po q6h prn anxiety  -opioid use disorder in sustained remission (on opioid maintenance therapy)   -Continue methadone 97.5mg  po qDay  -Agitation    -Continue ativan 2mg  po/IM q6h prn agitation  -insomnia   -Continue trazodone 50mg  po qhs prn insomnia  -Encourage participation in groups and therapeutic milieu  -disposition planning will be ongoing  Micheal Likens, MD 09/15/2018, 1:50 PM

## 2018-09-15 NOTE — Progress Notes (Signed)
Recreation Therapy Notes  Date: 10.10.19 Time: 1000 Location: 500 Hall Dayroom  Group Topic: Coping Skills  Goal Area(s) Addresses:  Patient will identify positive coping skills. Patient will identify benefits of coping skills.  Intervention: Worksheet  Activity: Coping Skills.  Patients were given a worksheet to identify instances in which coping skills would be needed.  Patients were to then come up with coping skills to deal with that situation.  Education: Coping Skills, Discharge Planning.   Education Outcome: Acknowledges understanding/In group clarification offered/Needs additional education.   Clinical Observations/Feedback: Pt did not attend group.    [name], LRT/CTRS         [name] 09/15/2018 10:50 AM 

## 2018-09-15 NOTE — Progress Notes (Signed)
Adult Psychoeducational Group Note  Date:  09/15/2018 Time:  8:35 PM  Group Topic/Focus:  Wrap-Up Group:   The focus of this group is to help patients review their daily goal of treatment and discuss progress on daily workbooks.  Participation Level:  Did Not Attend  Participation Quality:  Did not attend  Affect:  Did not attend  Cognitive:  Did not attend  Insight: None  Engagement in Group:  Did not attend  Modes of Intervention:  Did not attend  Additional Comments:  Patient did not attend wrap up group tonight.   Ovida Delagarza L Murray Guzzetta 09/15/2018, 8:35 PM

## 2018-09-16 MED ORDER — NICOTINE 21 MG/24HR TD PT24
21.0000 mg | MEDICATED_PATCH | Freq: Every day | TRANSDERMAL | Status: DC
  Administered 2018-09-16 – 2018-09-19 (×4): 21 mg via TRANSDERMAL
  Filled 2018-09-16 (×6): qty 1

## 2018-09-16 NOTE — Progress Notes (Signed)
D: Patient cautious on approach with this Clinical research associate. Observed to be restless and fidgety. Patient states her day has been "ok". Patient's affect anxious, preoccupied with congruent mood. Complained of generalized tooth pain ofa  5/10 but no other physical complaints.   A: Medicated per orders, prn tylenol and vistaril given. Medication education provided. Level III obs in place for safety. Emotional support offered. Patient encouraged to attend and participate in unit programming.    R: Patient verbalizes understanding of POC. On reassess, patient was asleep. Patient denies SI/HI/AVH and remains safe on level III obs. Will continue to monitor throughout the night.

## 2018-09-16 NOTE — Plan of Care (Signed)
Problem: Education: Goal: Knowledge of [place] [facility] Education information/materials will improve 09/16/2018 2255 by Janne Lab, RN Outcome: Progressing 09/16/2018 2111 by Janne Lab, RN Outcome: Progressing Goal: Emotional status will improve 09/16/2018 2255 by Janne Lab, RN Outcome: Progressing 09/16/2018 2111 by Janne Lab, RN Outcome: Progressing Goal: Mental status will improve 09/16/2018 2255 by Janne Lab, RN Outcome: Progressing 09/16/2018 2111 by Janne Lab, RN Outcome: Progressing Goal: Verbalization of understanding the information provided will improve 09/16/2018 2255 by Janne Lab, RN Outcome: Progressing 09/16/2018 2111 by Janne Lab, RN Outcome: Progressing   Problem: Activity: Goal: Interest or engagement in activities will improve 09/16/2018 2255 by Janne Lab, RN Outcome: Progressing 09/16/2018 2111 by Janne Lab, RN Outcome: Progressing Goal: Sleeping patterns will improve 09/16/2018 2255 by Janne Lab, RN Outcome: Progressing 09/16/2018 2111 by Janne Lab, RN Outcome: Progressing   Problem: Coping: Goal: Ability to verbalize frustrations and anger appropriately will improve 09/16/2018 2255 by Janne Lab, RN Outcome: Progressing 09/16/2018 2111 by Janne Lab, RN Outcome: Progressing Goal: Ability to demonstrate self-control will improve 09/16/2018 2255 by Janne Lab, RN Outcome: Progressing 09/16/2018 2111 by Janne Lab, RN Outcome: Progressing   Problem: Health Behavior/Discharge Planning: Goal: Identification of resources available to assist in meeting health care needs will improve 09/16/2018 2255 by Janne Lab, RN Outcome: Progressing 09/16/2018 2111 by Janne Lab, RN Outcome: Progressing Goal: Compliance with treatment plan for underlying cause of condition will improve 09/16/2018 2255 by  Janne Lab, RN Outcome: Progressing 09/16/2018 2111 by Janne Lab, RN Outcome: Progressing   Problem: Physical Regulation: Goal: Ability to maintain clinical measurements within normal limits will improve 09/16/2018 2255 by Janne Lab, RN Outcome: Progressing 09/16/2018 2111 by Janne Lab, RN Outcome: Progressing   Problem: Safety: Goal: Periods of time without injury will increase 09/16/2018 2255 by Janne Lab, RN Outcome: Progressing 09/16/2018 2111 by Janne Lab, RN Outcome: Progressing   Problem: Education: Goal: Utilization of techniques to improve thought processes will improve 09/16/2018 2255 by Janne Lab, RN Outcome: Progressing 09/16/2018 2111 by Janne Lab, RN Outcome: Progressing Goal: Knowledge of the prescribed therapeutic regimen will improve 09/16/2018 2255 by Janne Lab, RN Outcome: Progressing 09/16/2018 2111 by Janne Lab, RN Outcome: Progressing   Problem: Activity: Goal: Interest or engagement in leisure activities will improve 09/16/2018 2255 by Janne Lab, RN Outcome: Progressing 09/16/2018 2111 by Janne Lab, RN Outcome: Progressing Goal: Imbalance in normal sleep/wake cycle will improve 09/16/2018 2255 by Janne Lab, RN Outcome: Progressing 09/16/2018 2111 by Janne Lab, RN Outcome: Progressing   Problem: Coping: Goal: Coping ability will improve 09/16/2018 2255 by Janne Lab, RN Outcome: Progressing 09/16/2018 2111 by Janne Lab, RN Outcome: Progressing Goal: Will verbalize feelings 09/16/2018 2255 by Janne Lab, RN Outcome: Progressing 09/16/2018 2111 by Janne Lab, RN Outcome: Progressing   Problem: Health Behavior/Discharge Planning: Goal: Ability to make decisions will improve 09/16/2018 2255 by Janne Lab, RN Outcome: Progressing 09/16/2018 2111 by Janne Lab,  RN Outcome: Progressing Goal: Compliance with therapeutic regimen will improve 09/16/2018 2255 by Janne Lab, RN Outcome: Progressing 09/16/2018 2111 by Janne Lab, RN Outcome: Progressing   Problem: Role Relationship: Goal: Will demonstrate positive changes in social behaviors and relationships 09/16/2018 2255 by Janne Lab, RN Outcome: Progressing 09/16/2018 2111 by Janne Lab, RN Outcome:  Progressing   Problem: Safety: Goal: Ability to disclose and discuss suicidal ideas will improve 09/16/2018 2255 by Janne Lab, RN Outcome: Progressing 09/16/2018 2111 by Janne Lab, RN Outcome: Progressing Goal: Ability to identify and utilize support systems that promote safety will improve 09/16/2018 2255 by Janne Lab, RN Outcome: Progressing 09/16/2018 2111 by Janne Lab, RN Outcome: Progressing   Problem: Self-Concept: Goal: Will verbalize positive feelings about self 09/16/2018 2255 by Janne Lab, RN Outcome: Progressing 09/16/2018 2111 by Janne Lab, RN Outcome: Progressing Goal: Level of anxiety will decrease 09/16/2018 2255 by Janne Lab, RN Outcome: Progressing 09/16/2018 2111 by Janne Lab, RN Outcome: Progressing   Problem: Education: Goal: Ability to make informed decisions regarding treatment will improve 09/16/2018 2255 by Janne Lab, RN Outcome: Progressing 09/16/2018 2111 by Janne Lab, RN Outcome: Progressing   Problem: Coping: Goal: Coping ability will improve 09/16/2018 2255 by Janne Lab, RN Outcome: Progressing 09/16/2018 2111 by Janne Lab, RN Outcome: Progressing   Problem: Health Behavior/Discharge Planning: Goal: Identification of resources available to assist in meeting health care needs will improve 09/16/2018 2255 by Janne Lab, RN Outcome: Progressing 09/16/2018 2111 by Janne Lab, RN Outcome:  Progressing   Problem: Medication: Goal: Compliance with prescribed medication regimen will improve 09/16/2018 2255 by Janne Lab, RN Outcome: Progressing 09/16/2018 2111 by Janne Lab, RN Outcome: Progressing   Problem: Self-Concept: Goal: Ability to disclose and discuss suicidal ideas will improve 09/16/2018 2255 by Janne Lab, RN Outcome: Progressing 09/16/2018 2111 by Janne Lab, RN Outcome: Progressing Goal: Will verbalize positive feelings about self 09/16/2018 2255 by Janne Lab, RN Outcome: Progressing 09/16/2018 2111 by Janne Lab, RN Outcome: Progressing   Problem: Activity: Goal: Will verbalize the importance of balancing activity with adequate rest periods 09/16/2018 2255 by Janne Lab, RN Outcome: Progressing 09/16/2018 2111 by Janne Lab, RN Outcome: Progressing   Problem: Education: Goal: Will be free of psychotic symptoms 09/16/2018 2255 by Janne Lab, RN Outcome: Progressing 09/16/2018 2111 by Janne Lab, RN Outcome: Progressing Goal: Knowledge of the prescribed therapeutic regimen will improve 09/16/2018 2255 by Janne Lab, RN Outcome: Progressing 09/16/2018 2111 by Janne Lab, RN Outcome: Progressing   Problem: Coping: Goal: Coping ability will improve 09/16/2018 2255 by Janne Lab, RN Outcome: Progressing 09/16/2018 2111 by Janne Lab, RN Outcome: Progressing Goal: Will verbalize feelings 09/16/2018 2255 by Janne Lab, RN Outcome: Progressing 09/16/2018 2111 by Janne Lab, RN Outcome: Progressing   Problem: Health Behavior/Discharge Planning: Goal: Compliance with prescribed medication regimen will improve 09/16/2018 2255 by Janne Lab, RN Outcome: Progressing 09/16/2018 2111 by Janne Lab, RN Outcome: Progressing   Problem: Nutritional: Goal: Ability to achieve adequate nutritional intake will  improve 09/16/2018 2255 by Janne Lab, RN Outcome: Progressing 09/16/2018 2111 by Janne Lab, RN Outcome: Progressing   Problem: Role Relationship: Goal: Ability to communicate needs accurately will improve 09/16/2018 2255 by Janne Lab, RN Outcome: Progressing 09/16/2018 2111 by Janne Lab, RN Outcome: Progressing Goal: Ability to interact with others will improve 09/16/2018 2255 by Janne Lab, RN Outcome: Progressing 09/16/2018 2111 by Janne Lab, RN Outcome: Progressing   Problem: Safety: Goal: Ability to redirect hostility and anger into socially appropriate behaviors will improve 09/16/2018 2255 by Janne Lab, RN Outcome: Progressing 09/16/2018 2111 by Janne Lab, RN Outcome: Progressing Goal: Ability to  remain free from injury will improve 09/16/2018 2255 by Janne Lab, RN Outcome: Progressing 09/16/2018 2111 by Janne Lab, RN Outcome: Progressing   Problem: Self-Care: Goal: Ability to participate in self-care as condition permits will improve 09/16/2018 2255 by Janne Lab, RN Outcome: Progressing 09/16/2018 2111 by Janne Lab, RN Outcome: Progressing   Problem: Self-Concept: Goal: Will verbalize positive feelings about self 09/16/2018 2255 by Janne Lab, RN Outcome: Progressing 09/16/2018 2111 by Janne Lab, RN Outcome: Progressing   Problem: Education: Goal: Knowledge of General Education information will improve Description Including pain rating scale, medication(s)/side effects and non-pharmacologic comfort measures 09/16/2018 2255 by Janne Lab, RN Outcome: Progressing 09/16/2018 2111 by Janne Lab, RN Outcome: Progressing   Problem: Health Behavior/Discharge Planning: Goal: Ability to manage health-related needs will improve 09/16/2018 2255 by Janne Lab, RN Outcome: Progressing 09/16/2018 2111 by Janne Lab, RN Outcome: Progressing   Problem: Coping: Goal: Level of anxiety will decrease 09/16/2018 2255 by Janne Lab, RN Outcome: Progressing 09/16/2018 2111 by Janne Lab, RN Outcome: Progressing   Problem: Safety: Goal: Ability to remain free from injury will improve 09/16/2018 2255 by Janne Lab, RN Outcome: Progressing 09/16/2018 2111 by Janne Lab, RN Outcome: Progressing

## 2018-09-16 NOTE — Progress Notes (Signed)
Recreation Therapy Notes  Date: 10.11.19 Time: 1000 Location: 500 Hall Dayroom   Group Topic: Communication, Team Building, Problem Solving  Goal Area(s) Addresses:  Patient will effectively work with peer towards shared goal.  Patient will identify skills used to make activity successful.  Patient will identify how skills used during activity can be used to reach post d/c goals.   Behavioral Response: Engaged  Intervention: STEM Activity, Music  Activity: Stage manager. In teams patients were given 12 plastic drinking straws and a length of masking tape. Using the materials provided patients were asked to build a landing pad to catch a golf ball dropped from approximately 6 feet in the air.   Education: Pharmacist, community, Discharge Planning   Education Outcome: Acknowledges education/In group clarification offered/Needs additional education.   Clinical Observations/Feedback: Pt was smiling and bright.  Pt worked well with her peers to complete their landing pad.  Pt was observed giving suggestions and taking suggestions from peer.  Pt expressed the group used creativity to complete activity.  Pt also stated when she's going through something, she has to communicate with her support system to let them know what's going on.  Pt was engaged and able to focus on activity.       Caroll Rancher, LRT/CTRS     Lillia Abed, Chao Blazejewski A 09/16/2018 11:15 AM

## 2018-09-16 NOTE — Plan of Care (Signed)
Problem: Education: Goal: Knowledge of [facility] Education information/materials will improve Outcome: Progressing Goal: Emotional status will improve Outcome: Progressing Goal: Mental status will improve Outcome: Progressing Goal: Verbalization of understanding the information provided will improve Outcome: Progressing   Problem: Activity: Goal: Interest or engagement in activities will improve Outcome: Progressing Goal: Sleeping patterns will improve Outcome: Progressing   Problem: Coping: Goal: Ability to verbalize frustrations and anger appropriately will improve Outcome: Progressing Goal: Ability to demonstrate self-control will improve Outcome: Progressing   Problem: Health Behavior/Discharge Planning: Goal: Identification of resources available to assist in meeting health care needs will improve Outcome: Progressing Goal: Compliance with treatment plan for underlying cause of condition will improve Outcome: Progressing   Problem: Physical Regulation: Goal: Ability to maintain clinical measurements within normal limits will improve Outcome: Progressing   Problem: Safety: Goal: Periods of time without injury will increase Outcome: Progressing   Problem: Education: Goal: Utilization of techniques to improve thought processes will improve Outcome: Progressing Goal: Knowledge of the prescribed therapeutic regimen will improve Outcome: Progressing   Problem: Activity: Goal: Interest or engagement in leisure activities will improve Outcome: Progressing Goal: Imbalance in normal sleep/wake cycle will improve Outcome: Progressing   Problem: Coping: Goal: Coping ability will improve Outcome: Progressing Goal: Will verbalize feelings Outcome: Progressing   Problem: Health Behavior/Discharge Planning: Goal: Ability to make decisions will improve Outcome: Progressing Goal: Compliance with therapeutic regimen will improve Outcome: Progressing    Problem: Role Relationship: Goal: Will demonstrate positive changes in social behaviors and relationships Outcome: Progressing   Problem: Safety: Goal: Ability to disclose and discuss suicidal ideas will improve Outcome: Progressing Goal: Ability to identify and utilize support systems that promote safety will improve Outcome: Progressing   Problem: Self-Concept: Goal: Will verbalize positive feelings about self Outcome: Progressing Goal: Level of anxiety will decrease Outcome: Progressing   Problem: Education: Goal: Ability to make informed decisions regarding treatment will improve Outcome: Progressing   Problem: Coping: Goal: Coping ability will improve Outcome: Progressing   Problem: Health Behavior/Discharge Planning: Goal: Identification of resources available to assist in meeting health care needs will improve Outcome: Progressing   Problem: Medication: Goal: Compliance with prescribed medication regimen will improve Outcome: Progressing   Problem: Self-Concept: Goal: Ability to disclose and discuss suicidal ideas will improve Outcome: Progressing Goal: Will verbalize positive feelings about self Outcome: Progressing   Problem: Activity: Goal: Will verbalize the importance of balancing activity with adequate rest periods Outcome: Progressing   Problem: Education: Goal: Will be free of psychotic symptoms Outcome: Progressing Goal: Knowledge of the prescribed therapeutic regimen will improve Outcome: Progressing   Problem: Coping: Goal: Coping ability will improve Outcome: Progressing Goal: Will verbalize feelings Outcome: Progressing   Problem: Health Behavior/Discharge Planning: Goal: Compliance with prescribed medication regimen will improve Outcome: Progressing   Problem: Nutritional: Goal: Ability to achieve adequate nutritional intake will improve Outcome: Progressing   Problem: Role Relationship: Goal: Ability to communicate needs  accurately will improve Outcome: Progressing Goal: Ability to interact with others will improve Outcome: Progressing   Problem: Safety: Goal: Ability to redirect hostility and anger into socially appropriate behaviors will improve Outcome: Progressing Goal: Ability to remain free from injury will improve Outcome: Progressing   Problem: Self-Care: Goal: Ability to participate in self-care as condition permits will improve Outcome: Progressing   Problem: Self-Concept: Goal: Will verbalize positive feelings about self Outcome: Progressing   Problem: Education: Goal: Knowledge of General Education information will improve Description Including pain rating scale, medication(s)/side effects and  non-pharmacologic comfort measures Outcome: Progressing   Problem: Health Behavior/Discharge Planning: Goal: Ability to manage health-related needs will improve Outcome: Progressing   Problem: Coping: Goal: Level of anxiety will decrease Outcome: Progressing   Problem: Safety: Goal: Ability to remain free from injury will improve Outcome: Progressing

## 2018-09-16 NOTE — Progress Notes (Signed)
Nursing note 7p-7a  Pt observed interacting with peers on unit this shift. Displayed a flat affect and bright/ pleasant mood upon interaction with this Clinical research associate. Pt complains of tooth pain 6/10 Tylenol administered per MD order ,denies SI/HI, and also denies any audio or visual hallucinations at this time. Pt also endorses anxiety 8/10. See MAR for prn medication administration. Pt is able to verbally contract for safety with this RN. Goal: "go to groups" Pt is now resting in bed with eyes closed, with no signs or symptoms of pain or distress noted. Pt continues to remain safe on the unit and is observed by rounding every 15 min. RN will continue to monitor.

## 2018-09-16 NOTE — [facility] Group Notes (Signed)
[facility] LCSW Group Therapy  09/16/2018  1:05 PM  Type of Therapy:  Group therapy  Participation Level:  Active  Participation Quality:  Attentive  Affect:  Flat  Cognitive:  Oriented  Insight:  Limited  Engagement in Therapy:  Limited  Modes of Intervention:  Discussion, Socialization  Summary of Progress/Problems:  Chaplain was here to lead a group on themes of hope and courage. Stayed the entire time, engaged throughout. "Hope is not giving up even if you don't have anyone for support.  My family is all alcoholics and pot heads, so they are stuck in negarive.  I think there's sonething to be said for not giving up." Mood neutral, nos signs nor symptoms of psychosis.  Daryel Gerald B 09/16/2018 1:17 PM

## 2018-09-16 NOTE — [facility] Suicide Risk Assessment (Signed)
[facility] INPATIENT:  Family/Significant Other Suicide Prevention Education  Suicide Prevention Education:  Education Completed; No one has been identified by the patient as the family member/significant other with whom the patient will be residing, and identified as the person(s) who will aid the patient in the event of a mental health crisis (suicidal ideations/suicide attempt).  With written consent from the patient, the family member/significant other has been provided the following suicide prevention education, prior to the and/or following the discharge of the patient.  The suicide prevention education provided includes the following:  Suicide risk factors  Suicide prevention and interventions  National Suicide Hotline telephone number  [facility] assessment telephone number  [facility] Emergency Assistance 911  [facility] and/or Residential Mobile Crisis Unit telephone number  Request made of family/significant other to:  Remove weapons (e.g., guns, rifles, knives), all items previously/currently identified as safety concern.    Remove drugs/medications (over-the-counter, prescriptions, illicit drugs), all items previously/currently identified as a safety concern.  The family member/significant other verbalizes understanding of the suicide prevention education information provided.  The family member/significant other agrees to remove the items of safety concern listed. The patient did not endorse SI at the time of admission, nor did the patient c/o SI during the stay here.  SPE not required.  [name] 09/16/2018, 5:03 PM

## 2018-09-16 NOTE — Progress Notes (Signed)
Pt did attend group, and actively participated. 

## 2018-09-16 NOTE — Plan of Care (Signed)
  Problem: Education: Goal: Verbalization of understanding the information provided will improve Outcome: Progressing   Problem: Safety: Goal: Periods of time without injury will increase Outcome: Progressing   

## 2018-09-16 NOTE — Plan of Care (Signed)
Progress Note  D: pt found in bed; compliant with medication administration. Pt states she slept fair last night. Pt rates her depression/hopelessness/anxiety a 5/0/6 out of 10 respectively. Pt describes having headaches and blurry vision this morning, and pain that she rates at 5/10. Pt states her goal for today to is to go to a group. Pt denies any si/hi/ah/vh and verbally agrees to approach staff if these become apparent or before harming herself while at [facility]. A: pt provided support and encouragement. Pt given medications per protocol and standing orders. Q32m safety checks implemented and continued.  R: pt safe on the unit. Will continue to monitor.   Pt progressing in the following metrics  Problem: Education: Goal: Knowledge of [facility] Education information/materials will improve Outcome: Progressing Goal: Emotional status will improve Outcome: Progressing Goal: Mental status will improve Outcome: Progressing   Problem: Activity: Goal: Sleeping patterns will improve Outcome: Progressing   Problem: Coping: Goal: Ability to verbalize frustrations and anger appropriately will improve Outcome: Progressing Goal: Ability to demonstrate self-control will improve Outcome: Progressing

## 2018-09-16 NOTE — Progress Notes (Signed)
[facility] MD Progress Note  09/16/2018 2:17 PM [name]  MRN:  [id number] Subjective:    [name] is a 37 y/o F with history of bipolar, PTSD, and opioid use disorder (now in remission on methadone maintenance therapy) who was admitted voluntarily from [facility] with worsening symptoms of mood lability, decreased need for sleep with no sleep for at least 2 days, disorganized/bizarrre behaviors such as regressing into child-like state, and agitation such as running out of the house yelling and banging on neighbor's doors. Pt had presented to [facility] on 10/3 with similar symptoms, and she was discharged to outpatient level of care, but she continued to have worsening symptoms. Collateral information suggests that pt only takes methadone as her sole outpatient medication, and the dose was recently increased. Pt was medically cleared and then transferred to [facility] for additional treatment and stabilization. She was resumed on home dose of methadone and she was started on trial of depakote and zoloft to address her mood symptoms. She has been reporting incremental improvement of her presenting symptoms.  Today upon evaluation, pt shares, "I'm good - I seem like I'm doing a lot better." Pt appears more engaged with interview and to have brighter affect/smiling during interview, which is an improvement compared to previous encounters. She denies any specific concerns today. She is sleeping well. Her appetite is good. She denies other physical complaints. She reports symptoms of tardive dyskinesia are unchanged and she appears to be tolerating her current medications without any influence on her pre-existing movement symptoms. She denies SI/HI/AH/VH. She is tolerating her medications well overall, and she is in agreement to continue her current regimen without changes. We will obtain depakote level over the weekend. Pt was in agreement with the above plan, and she had no further questions, comments, or concerns.    Principal Problem: Bipolar affective disorder, current episode manic with psychotic symptoms (HCC) Diagnosis:   Patient Active Problem List   Diagnosis Date Noted  . PTSD (post-traumatic stress disorder) [F43.10] 09/14/2018  . Bipolar affective disorder, current episode manic with psychotic symptoms (HCC) [F31.2] 09/13/2018  . Soft tissue infection [L08.9] 08/10/2017  . Polysubstance dependence including opioid type drug, episodic abuse (HCC) [F11.20, F19.20] 06/27/2017  . Substance induced mood disorder (HCC) [F19.94] 06/27/2017  . Anxiety [F41.9]   . Persistent recurrent vomiting [R11.15] 08/26/2013  . Dehydration [E86.0] 08/26/2013  . Acute gastroenteritis [K52.9] 08/26/2013  . Unspecified essential hypertension [I10] 08/26/2013  . Right knee pain [M25.561] 06/27/2013  . URI (upper respiratory infection) [J06.9] 12/02/2012  . Carpal tunnel syndrome, bilateral [G56.03] 04/21/2011  . Vaginitis and vulvovaginitis [616.1] 04/21/2011  . ANXIETY DEPRESSION [F34.1] 08/20/2010  . SUBSTANCE ABUSE, MULTIPLE [F19.10] 08/20/2010  . ADHD [F90.9] 08/20/2010  . UTI [N39.0] 08/20/2010  . 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 DYSURIA [R30.0] 08/20/2010  . ELEVATED BLOOD PRESSURE WITHOUT DIAGNOSIS OF HYPERTENSION [R03.0] 08/20/2010   Total Time spent with patient: 30 minutes  Past Psychiatric History: see H&P  Past Medical History:  Past Medical History:  Diagnosis Date  . ADHD (attention deficit hyperactivity disorder)   . Anxiety   . Borderline systolic HTN   . Depression   . Heroin abuse (HCC)   . Hypertension   . Kidney stone   . Knee pain, bilateral    - patellofemoral syndrome, followed by Dr. [name] at [facility]  . Migraines   . Polysubstance abuse (HCC)    - Hx ETOH, cocaine, THC, IV heroin, narcotics  . Psychiatric care  Previous [facility] H&P notes PMHx Schizophrenia, though patient denies this.  . Seizures (HCC)     Past Surgical History:  Procedure Laterality Date   . KNEE SURGERY    . SHOULDER SURGERY     Family History: History reviewed. No pertinent family history. Family Psychiatric  History: see H&P Social History:  Social History   Substance and Sexual Activity  Alcohol Use No     Social History   Substance and Sexual Activity  Drug Use No  . Types: IV   Comment: none for 8 days    Social History   Socioeconomic History  . Marital status: Single    Spouse name: Not on file  . Number of children: Not on file  . Years of education: Not on file  . Highest education level: Not on file  Occupational History  . Not on file  Social Needs  . Financial resource strain: Not on file  . Food insecurity:    Worry: Not on file    Inability: Not on file  . Transportation needs:    Medical: Not on file    Non-medical: Not on file  Tobacco Use  . Smoking status: Current Every Day Smoker    Packs/day: 1.00    Types: Cigarettes  . Smokeless tobacco: Never Used  Substance and Sexual Activity  . Alcohol use: No  . Drug use: No    Types: IV    Comment: none for 8 days  . Sexual activity: Yes    Birth control/protection: None  Lifestyle  . Physical activity:    Days per week: Not on file    Minutes per session: Not on file  . Stress: Not on file  Relationships  . Social connections:    Talks on phone: Not on file    Gets together: Not on file    Attends religious service: Not on file    Active member of club or organization: Not on file    Attends meetings of clubs or organizations: Not on file    Relationship status: Not on file  Other Topics Concern  . Not on file  Social History Narrative  . Not on file   Additional Social History:                         Sleep: Good  Appetite:  Good  Current Medications: Current Facility-Administered Medications  Medication Dose Route Frequency Provider Last Rate Last Dose  . acetaminophen (TYLENOL) tablet 650 mg  650 mg Oral Q4H PRN Maryagnes Amos, FNP   650 mg  at 09/16/18 1255  . alum & mag hydroxide-simeth (MAALOX/MYLANTA) 200-200-20 MG/5ML suspension 30 mL  30 mL Oral Q6H PRN Starkes-[facility], Juel Burrow, FNP      . divalproex (DEPAKOTE) DR tablet 250 mg  250 mg Oral Billie Lade T, MD   250 mg at 09/16/18 0801   And  . divalproex (DEPAKOTE) DR tablet 500 mg  500 mg Oral QHS Micheal Likens, MD   500 mg at 09/15/18 2104  . hydrOXYzine (ATARAX/VISTARIL) tablet 50 mg  50 mg Oral Q6H PRN Micheal Likens, MD   50 mg at 09/15/18 2104  . LORazepam (ATIVAN) tablet 2 mg  2 mg Oral Q6H PRN Micheal Likens, MD       Or  . LORazepam (ATIVAN) injection 2 mg  2 mg Intramuscular Q6H PRN Micheal Likens, MD      . magnesium  hydroxide (MILK OF MAGNESIA) suspension 30 mL  30 mL Oral Daily PRN Maryagnes Amos, FNP      . methadone (DOLOPHINE) tablet 97.5 mg  97.5 mg Oral Daily Micheal Likens, MD   97.5 mg at 09/16/18 0804  . nicotine (NICODERM CQ - dosed in mg/24 hours) patch 21 mg  21 mg Transdermal Daily Micheal Likens, MD   21 mg at 09/16/18 1300  . ondansetron (ZOFRAN) tablet 4 mg  4 mg Oral Q8H PRN Maryagnes Amos, FNP      . sertraline (ZOLOFT) tablet 50 mg  50 mg Oral Daily Micheal Likens, MD   50 mg at 09/16/18 0802  . traZODone (DESYREL) tablet 50 mg  50 mg Oral QHS,MR X 1 Jackelyn Poling, NP   Stopped at 09/16/18 0201    Lab Results: No results found for this or any previous visit (from the past 48 hour(s)).  Blood Alcohol level:  Lab Results  Component Value Date   ETH <10 09/12/2018   ETH <10 09/09/2018    Metabolic Disorder Labs: Lab Results  Component Value Date   HGBA1C 5.0 09/14/2018   MPG 96.8 09/14/2018   No results found for: PROLACTIN Lab Results  Component Value Date   CHOL 162 09/14/2018   TRIG 146 09/14/2018   HDL 47 09/14/2018   CHOLHDL 3.4 09/14/2018   VLDL 29 09/14/2018   LDLCALC 86 09/14/2018    Physical Findings: AIMS: Facial  and Oral Movements Muscles of Facial Expression: None, normal Lips and Perioral Area: None, normal Jaw: None, normal Tongue: None, normal,Extremity Movements Upper (arms, wrists, hands, fingers): None, normal Lower (legs, knees, ankles, toes): None, normal, Trunk Movements Neck, shoulders, hips: None, normal, Overall Severity Severity of abnormal movements (highest score from questions above): None, normal Incapacitation due to abnormal movements: None, normal Patient's awareness of abnormal movements (rate only patient's report): No Awareness, Dental Status Current problems with teeth and/or dentures?: No Does patient usually wear dentures?: No  CIWA:    COWS:     Musculoskeletal: Strength & Muscle Tone: within normal limits Gait & Station: normal Patient leans: N/A  Psychiatric Specialty Exam: Physical Exam  Nursing note and vitals reviewed.   Review of Systems  Constitutional: Negative for chills and fever.  Respiratory: Negative for cough and shortness of breath.   Cardiovascular: Negative for chest pain.  Gastrointestinal: Negative for abdominal pain, heartburn, nausea and vomiting.  Psychiatric/Behavioral: Negative for depression, hallucinations and suicidal ideas. The patient is not nervous/anxious and does not have insomnia.     Blood pressure 110/85, pulse 85, temperature 98.5 F (36.9 C), temperature source Oral, resp. rate 16, height 5\' 6"  (1.676 m), weight 77.1 kg, last menstrual period 09/12/2018.Body mass index is 27.44 kg/m.  General Appearance: Casual and Fairly Groomed  Eye Contact:  Good  Speech:  Clear and Coherent and Normal Rate  Volume:  Normal  Mood:  Euthymic  Affect:  Blunt and Congruent  Thought Process:  Coherent and Goal Directed  Orientation:  Full (Time, Place, and Person)  Thought Content:  Logical  Suicidal Thoughts:  No  Homicidal Thoughts:  No  Memory:  Immediate;   Fair Recent;   Fair Remote;   Fair  Judgement:  Fair  Insight:  Fair   Psychomotor Activity:  Normal  Concentration:  Concentration: Good  Recall:  Good  Fund of Knowledge:  Fair  Language:  Fair  Akathisia:  No  Handed:    AIMS (if indicated):  Assets:  Resilience Social Support  ADL's:  Intact  Cognition:  WNL  Sleep:  Number of Hours: 6.5   Treatment Plan Summary: Daily contact with patient to assess and evaluate symptoms and progress in treatment and Medication management   -Continue inpatient hospitalization  -Bipolar I, current episode manic with psychosis and PTSD             -Continue zoloft 50mg  po qDay             -Continue depakote DR 250mg  po qAM + 500mg  po qhs             -Depakote level on morning of 09/18/18  -anxiety              -Continue vistaril 50mg  po q6h prn anxiety  -opioid use disorder in sustained remission (on opioid maintenance therapy)             -Continue methadone 97.5mg  po qDay  -Agitation                      -Continue ativan 2mg  po/IM q6h prn agitation  -insomnia             -Continue trazodone 50mg  po qhs prn insomnia  -Encourage participation in groups and therapeutic milieu  -disposition planning will be ongoing  Micheal Likens, MD 09/16/2018, 2:17 PM

## 2018-09-17 DIAGNOSIS — F419 Anxiety disorder, unspecified: Secondary | ICD-10-CM

## 2018-09-17 DIAGNOSIS — F1190 Opioid use, unspecified, uncomplicated: Secondary | ICD-10-CM

## 2018-09-17 DIAGNOSIS — R451 Restlessness and agitation: Secondary | ICD-10-CM

## 2018-09-17 NOTE — Progress Notes (Signed)
Adult Psychoeducational Group Note  Date:  09/17/2018 Time:  9:56 PM  Group Topic/Focus:  Wrap-Up Group:   The focus of this group is to help patients review their daily goal of treatment and discuss progress on daily workbooks.  Participation Level:  Active  Participation Quality:  Appropriate  Affect:  Appropriate  Cognitive:  Appropriate  Insight: Appropriate  Engagement in Group:  Engaged  Modes of Intervention:  Discussion  Additional Comments: The patient expressed that she rates today a 8.The patient also said she attended groups.  Octavio Manns 09/17/2018, 9:56 PM

## 2018-09-17 NOTE — Progress Notes (Signed)
D. Pt presents with an anxious affect congruent with mood- calm and cooperative behavior. Pt observed interacting well with peers in the dayroom Per pt's self inventory, pt rates her depression, hopelessness and anxiety a 0/0/5, respectively. Pt writes that her most important goal today is " go to groups" and "talk to people".  Pt currently denies SI/HI and AV hallucinations A. Labs and vitals monitored. Pt compliant with medications. Pt supported emotionally and encouraged to express concerns and ask questions.   R. Pt remains safe with 15 minute checks. Will continue POC.

## 2018-09-17 NOTE — Progress Notes (Signed)
Patient ID: [name], female   DOB: [birth year], 37 y.o.   MRN: [id number] Per State regulations 482.30 this chart was reviewed for medical necessity with respect to the patient's admission/duration of stay.    Next review date:09/21/18  Thurman Coyer, BSN, RN-BC  Case Manager

## 2018-09-17 NOTE — [facility] Group Notes (Signed)
[facility] Group Notes:  (Nursing)  Date:  09/17/2018  Time: 1000 AM Type of Therapy:  Nurse Education  Participation Level:  Active  Participation Quality:  Appropriate and Attentive  Affect:  Appropriate  Cognitive:  Alert and Appropriate  Insight:  Appropriate  Engagement in Group:  Engaged  Modes of Intervention:  Discussion and Education  Summary of Progress/Problems: Nurse led group played a non competitive learning/communication board game that fosters listening skills as well as self expression  Shela Nevin 09/17/2018, 4:56 PM

## 2018-09-17 NOTE — Progress Notes (Signed)
[facility] MD Progress Note  09/17/2018 12:25 PM [name]  MRN:  [id number]  Subjective:  [name] is a 37 y/o F with history of bipolar, PTSD, and opioid use disorder (now in remission on methadone maintenance therapy) who was admitted voluntarily from [facility] with worsening symptoms of mood lability, decreased need for sleep with no sleep for at least 2 days, disorganized/bizarrre behaviors such as regressing into child-like state, and agitation such as running out of the house yelling and banging on neighbor's doors. Pt had presented to [facility] on 10/3 with similar symptoms, and she was discharged to outpatient level of care, but she continued to have worsening symptoms. Collateral information suggests that pt only takes methadone as her sole outpatient medication, and the dose was recently increased. Pt was medically cleared and then transferred to [facility] for additional treatment and stabilization.She was resumed on home dose of methadone and she was started on trial of depakote and zoloft to address her mood symptoms. She has been reporting incremental improvement of her presenting symptoms.  Patient is seen and examined.  Patient is a 37 year old female with a past psychiatric history significant for bipolar disorder, PTSD as well as opiate use disorder (now in remission on methadone maintenance therapy).  She is seen in follow-up.  She denied any complaint this a.m.  She denied any suicidal ideation.  She has a Depakote level ordered for the a.m.  She denied any problems with akathisia or dyskinesia with her current medications.  Her current medications include Depakote DR 250 mg p.o. every morning and 500 mg p.o. nightly.  She also remains on sertraline 50 mg p.o. daily, trazodone 50 mg p.o. nightly as needed and her methadone at 97.5 mg p.o. daily.  Her vital signs are stable.  Nursing notes reflect that she slept 6.25 hours last night.  Review of her laboratories revealed essentially normal  laboratories, and a negative drug screen. Principal Problem: Bipolar affective disorder, current episode manic with psychotic symptoms (HCC) Diagnosis:   Patient Active Problem List   Diagnosis Date Noted  . PTSD (post-traumatic stress disorder) [F43.10] 09/14/2018  . Bipolar affective disorder, current episode manic with psychotic symptoms (HCC) [F31.2] 09/13/2018  . Soft tissue infection [L08.9] 08/10/2017  . Polysubstance dependence including opioid type drug, episodic abuse (HCC) [F11.20, F19.20] 06/27/2017  . Substance induced mood disorder (HCC) [F19.94] 06/27/2017  . Anxiety [F41.9]   . Persistent recurrent vomiting [R11.15] 08/26/2013  . Dehydration [E86.0] 08/26/2013  . Acute gastroenteritis [K52.9] 08/26/2013  . Unspecified essential hypertension [I10] 08/26/2013  . Right knee pain [M25.561] 06/27/2013  . URI (upper respiratory infection) [J06.9] 12/02/2012  . Carpal tunnel syndrome, bilateral [G56.03] 04/21/2011  . Vaginitis and vulvovaginitis [616.1] 04/21/2011  . ANXIETY DEPRESSION [F34.1] 08/20/2010  . SUBSTANCE ABUSE, MULTIPLE [F19.10] 08/20/2010  . ADHD [F90.9] 08/20/2010  . UTI [N39.0] 08/20/2010  . 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 DYSURIA [R30.0] 08/20/2010  . ELEVATED BLOOD PRESSURE WITHOUT DIAGNOSIS OF HYPERTENSION [R03.0] 08/20/2010   Total Time spent with patient: 20 minutes  Past Psychiatric History: See admission H&P  Past Medical History:  Past Medical History:  Diagnosis Date  . ADHD (attention deficit hyperactivity disorder)   . Anxiety   . Borderline systolic HTN   . Depression   . Heroin abuse (HCC)   . Hypertension   . Kidney stone   . Knee pain, bilateral    - patellofemoral syndrome, followed by Dr. [name] at [facility]  . Migraines   . Polysubstance abuse (  HCC)    - Hx ETOH, cocaine, THC, IV heroin, narcotics  . Psychiatric care    Previous [facility] H&P notes PMHx Schizophrenia, though patient denies this.  . Seizures (HCC)      Past Surgical History:  Procedure Laterality Date  . KNEE SURGERY    . SHOULDER SURGERY     Family History: History reviewed. No pertinent family history. Family Psychiatric  History: See admission H&P Social History:  Social History   Substance and Sexual Activity  Alcohol Use No     Social History   Substance and Sexual Activity  Drug Use No  . Types: IV   Comment: none for 8 days    Social History   Socioeconomic History  . Marital status: Single    Spouse name: Not on file  . Number of children: Not on file  . Years of education: Not on file  . Highest education level: Not on file  Occupational History  . Not on file  Social Needs  . Financial resource strain: Not on file  . Food insecurity:    Worry: Not on file    Inability: Not on file  . Transportation needs:    Medical: Not on file    Non-medical: Not on file  Tobacco Use  . Smoking status: Current Every Day Smoker    Packs/day: 1.00    Types: Cigarettes  . Smokeless tobacco: Never Used  Substance and Sexual Activity  . Alcohol use: No  . Drug use: No    Types: IV    Comment: none for 8 days  . Sexual activity: Yes    Birth control/protection: None  Lifestyle  . Physical activity:    Days per week: Not on file    Minutes per session: Not on file  . Stress: Not on file  Relationships  . Social connections:    Talks on phone: Not on file    Gets together: Not on file    Attends religious service: Not on file    Active member of club or organization: Not on file    Attends meetings of clubs or organizations: Not on file    Relationship status: Not on file  Other Topics Concern  . Not on file  Social History Narrative  . Not on file   Additional Social History:                         Sleep: Good  Appetite:  Good  Current Medications: Current Facility-Administered Medications  Medication Dose Route Frequency Provider Last Rate Last Dose  . acetaminophen (TYLENOL) tablet  650 mg  650 mg Oral Q4H PRN Maryagnes Amos, FNP   650 mg at 09/17/18 1610  . alum & mag hydroxide-simeth (MAALOX/MYLANTA) 200-200-20 MG/5ML suspension 30 mL  30 mL Oral Q6H PRN Starkes-Perry, Juel Burrow, FNP      . divalproex (DEPAKOTE) DR tablet 250 mg  250 mg Oral Veatrice Kells, MD   250 mg at 09/17/18 9604   And  . divalproex (DEPAKOTE) DR tablet 500 mg  500 mg Oral QHS Jolyne Loa T, MD   500 mg at 09/16/18 2037  . hydrOXYzine (ATARAX/VISTARIL) tablet 50 mg  50 mg Oral Q6H PRN Micheal Likens, MD   50 mg at 09/17/18 5409  . LORazepam (ATIVAN) tablet 2 mg  2 mg Oral Q6H PRN Micheal Likens, MD       Or  . LORazepam (ATIVAN)  injection 2 mg  2 mg Intramuscular Q6H PRN Micheal Likens, MD      . magnesium hydroxide (MILK OF MAGNESIA) suspension 30 mL  30 mL Oral Daily PRN Starkes-Perry, Juel Burrow, FNP      . methadone (DOLOPHINE) tablet 97.5 mg  97.5 mg Oral Daily Micheal Likens, MD   97.5 mg at 09/17/18 0820  . nicotine (NICODERM CQ - dosed in mg/24 hours) patch 21 mg  21 mg Transdermal Daily Micheal Likens, MD   21 mg at 09/17/18 0823  . ondansetron (ZOFRAN) tablet 4 mg  4 mg Oral Q8H PRN Maryagnes Amos, FNP      . sertraline (ZOLOFT) tablet 50 mg  50 mg Oral Daily Micheal Likens, MD   50 mg at 09/17/18 0820  . traZODone (DESYREL) tablet 50 mg  50 mg Oral QHS,MR X 1 [name] A, NP   50 mg at 09/16/18 2221    Lab Results: No results found for this or any previous visit (from the past 48 hour(s)).  Blood Alcohol level:  Lab Results  Component Value Date   ETH <10 09/12/2018   ETH <10 09/09/2018    Metabolic Disorder Labs: Lab Results  Component Value Date   HGBA1C 5.0 09/14/2018   MPG 96.8 09/14/2018   No results found for: PROLACTIN Lab Results  Component Value Date   CHOL 162 09/14/2018   TRIG 146 09/14/2018   HDL 47 09/14/2018   CHOLHDL 3.4 09/14/2018   VLDL 29 09/14/2018    LDLCALC 86 09/14/2018    Physical Findings: AIMS: Facial and Oral Movements Muscles of Facial Expression: None, normal Lips and Perioral Area: None, normal Jaw: None, normal Tongue: None, normal,Extremity Movements Upper (arms, wrists, hands, fingers): None, normal Lower (legs, knees, ankles, toes): None, normal, Trunk Movements Neck, shoulders, hips: None, normal, Overall Severity Severity of abnormal movements (highest score from questions above): None, normal Incapacitation due to abnormal movements: None, normal Patient's awareness of abnormal movements (rate only patient's report): No Awareness, Dental Status Current problems with teeth and/or dentures?: No Does patient usually wear dentures?: No  CIWA:    COWS:     Musculoskeletal: Strength & Muscle Tone: within normal limits Gait & Station: normal Patient leans: N/A  Psychiatric Specialty Exam: Physical Exam  Nursing note and vitals reviewed. Constitutional: She is oriented to person, place, and time. She appears well-developed and well-nourished.  HENT:  Head: Normocephalic and atraumatic.  Respiratory: Effort normal.  Neurological: She is alert and oriented to person, place, and time.    ROS  Blood pressure 130/80, pulse 88, temperature 98.6 F (37 C), resp. rate 16, height 5\' 6"  (1.676 m), weight 77.1 kg, last menstrual period 09/12/2018.Body mass index is 27.44 kg/m.  General Appearance: Casual  Eye Contact:  Fair  Speech:  Normal Rate  Volume:  Normal  Mood:  Anxious  Affect:  Congruent  Thought Process:  Coherent and Descriptions of Associations: Intact  Orientation:  Full (Time, Place, and Person)  Thought Content:  Logical  Suicidal Thoughts:  No  Homicidal Thoughts:  No  Memory:  Immediate;   Fair Recent;   Fair Remote;   Fair  Judgement:  Intact  Insight:  Fair  Psychomotor Activity:  Normal  Concentration:  Concentration: Fair and Attention Span: Fair  Recall:  [employer] of Knowledge:  Fair   Language:  Fair  Akathisia:  Negative  Handed:  Right  AIMS (if indicated):  Assets:  Communication Skills Desire for Improvement Housing Physical Health Resilience  ADL's:  Intact  Cognition:  WNL  Sleep:  Number of Hours: 6.25     Treatment Plan Summary: Daily contact with patient to assess and evaluate symptoms and progress in treatment, Medication management and Plan : Patient is seen and examined.  Patient is a 37 year old female with the above-stated past psychiatric history seen in follow-up.  #1 bipolar disorder type I, current episode manic with psychosis and PTSD-continue Zoloft and Depakote DR at current dosages.  Depakote level, CBC with differential and liver function enzymes are due on the morning of 09/18/2018.  No change in his medication.  #2 anxiety-continue Vistaril 50 mg p.o. every 6 hours as needed anxiety.  #3 opioid use disorder in sustained remission on opiate maintenance therapy-no change in methadone dosage.  #4-agitation-continue Ativan 2 mg p.o. or IM every 6 hours as needed agitation #5-continue trazodone 50 mg p.o. nightly as needed insomnia.  #6-disposition planning-ongoing.  Antonieta Pert, MD 09/17/2018, 12:25 PM

## 2018-09-17 NOTE — [facility] Group Notes (Signed)
  [facility] LCSW Group Therapy Note  Date/Time:  09/17/2018 11:15AM-12:00PM  Type of Therapy and Topic:  Group Therapy:  Reasons for Hospitalizations  Participation Level:  Active   Description of Group This process group involved patients discussing their thoughts about why people, in their experiences, may need to be hospitalized or may be seen by family members/people in the community as needing to be hospitalized.   Feelings related to these reasons were discussed and group members were allowed to vent their positive and negative reactions.  The group then brainstormed specific ways in which they could take care of themselves outside of the hospital in order to avoid rehospitalization.  CSW ensured that this list included such items as staying on medications, going to aftercare appointments, taking care of sleep hygiene, using a pillbox, open communication with support systems, and truthfully acknowledging symptoms to doctors, therapists, and supporters.  Therapeutic Goals 1. Patient will identify a variety of reasons that exist for people to become psychiatrically hospitalized, and will discuss why it may be necessary even though it is not desired/liked. 2. Patient will verbalize benefits of hospitalization to those who need it and ways in which those benefits can be replicated in an outpatient setting. 3. Patients will brainstorm together ways they can take care of themselves after discharge in order to avoid rehospitalization.  Summary of Patient Progress:  The patient expressed that she is in the hospital because she has Bipolar disorder and has been going without her medications, became manic and did not sleep "for a long time."  She said that the reason she went off her meds was because of tardive dyskinesia.  She has been involved in the past with a peer counselor and with peer support groups at [facility] in [place], and when asked to do so was able to share these  experiences with the other group members to encourage them to take advantage of the same types of supports.   In response to another patient's question, CSW provided psychoeducation about the diagnoses of Bipolar disorder, Schizophrenia, and Schizoaffective disorder.  She responded positively to this.  Therapeutic Modalities Cognitive Behavioral Therapy Motivational Interviewing    Ambrose Mantle, LCSW 09/17/2018, 8:46 AM

## 2018-09-17 NOTE — Progress Notes (Signed)
D. Pt quiet ,somewhat guarded- took her meds early and went to bed  Pt currently denies SI/HI and AV hallucinations. A. Labs and vitals monitored. Pt compliant with medications. Pt supported emotionally and encouraged to express concerns and ask questions.   R. Pt remains safe with 15 minute checks. Will continue POC.

## 2018-09-18 LAB — CBC WITH DIFFERENTIAL/PLATELET
Abs Immature Granulocytes: 0.02 10*3/uL (ref 0.00–0.07)
Basophils Absolute: 0.1 10*3/uL (ref 0.0–0.1)
Basophils Relative: 1 %
EOS PCT: 5 %
Eosinophils Absolute: 0.3 10*3/uL (ref 0.0–0.5)
HCT: 43 % (ref 36.0–46.0)
HEMOGLOBIN: 13.3 g/dL (ref 12.0–15.0)
Immature Granulocytes: 0 %
LYMPHS ABS: 2.8 10*3/uL (ref 0.7–4.0)
LYMPHS PCT: 50 %
MCH: 28.4 pg (ref 26.0–34.0)
MCHC: 30.9 g/dL (ref 30.0–36.0)
MCV: 91.7 fL (ref 80.0–100.0)
MONO ABS: 0.4 10*3/uL (ref 0.1–1.0)
Monocytes Relative: 6 %
NEUTROS PCT: 38 %
Neutro Abs: 2.2 10*3/uL (ref 1.7–7.7)
Platelets: 348 10*3/uL (ref 150–400)
RBC: 4.69 MIL/uL (ref 3.87–5.11)
RDW: 13.3 % (ref 11.5–15.5)
WBC: 5.7 10*3/uL (ref 4.0–10.5)
nRBC: 0 % (ref 0.0–0.2)

## 2018-09-18 LAB — HEPATIC FUNCTION PANEL
ALBUMIN: 3.6 g/dL (ref 3.5–5.0)
ALK PHOS: 53 U/L (ref 38–126)
ALT: 20 U/L (ref 0–44)
AST: 24 U/L (ref 15–41)
BILIRUBIN TOTAL: 0.3 mg/dL (ref 0.3–1.2)
Bilirubin, Direct: <0.1 mg/dL (ref 0.0–0.2)
Total Protein: 7 g/dL (ref 6.5–8.1)

## 2018-09-18 LAB — VALPROIC ACID LEVEL: VALPROIC ACID LVL: 60 ug/mL (ref 50.0–100.0)

## 2018-09-18 NOTE — Plan of Care (Signed)
Progress note  D: pt found in bed; compliant with medication administration. Pt states she slept fair. Pt rates her depression/hopelessness/anxiety a 0/0/5 out of 10 respectively. Pt denies any physical symptoms or pain, rating her pain a 0/10. Pt states her goal for today is to go to group and will achieve this by talking to people. Pt denies any si/hi/ah/vh and verbally agrees to approach staff if these become apparent or before harming herself while at [facility]. A: pt provided support and encouragement. Pt given medications per protocol and standing orders. Q38m safety checks implemented and continued.  R: pt safe on the unit. Will continue to monitor.   Pt progressing in the following metrics  Problem: Medication: Goal: Compliance with prescribed medication regimen will improve Outcome: Progressing   Problem: Self-Concept: Goal: Ability to disclose and discuss suicidal ideas will improve Outcome: Progressing Goal: Will verbalize positive feelings about self Outcome: Progressing   Problem: Activity: Goal: Will verbalize the importance of balancing activity with adequate rest periods Outcome: Progressing   Problem: Education: Goal: Knowledge of the prescribed therapeutic regimen will improve Outcome: Progressing   Problem: Coping: Goal: Coping ability will improve Outcome: Progressing Goal: Will verbalize feelings Outcome: Progressing

## 2018-09-18 NOTE — Progress Notes (Signed)
[facility] MD Progress Note  09/18/2018 12:33 PM [name]  MRN:  [id number] Subjective: Subjective:  [name] is a 37 y/o F with history of bipolar, PTSD, and opioid use disorder (now in remission on methadone maintenance therapy) who was admitted voluntarily from [facility] with worsening symptoms of mood lability, decreased need for sleep with no sleep for at least 2 days, disorganized/bizarrre behaviors such as regressing into child-like state, and agitation such as running out of the house yelling and banging on neighbor's doors. Pt had presented to [facility] on 10/3 with similar symptoms, and she was discharged to outpatient level of care, but she continued to have worsening symptoms. Collateral information suggests that pt only takes methadone as her sole outpatient medication, and the dose was recently increased. Pt was medically cleared and then transferred to [facility] for additional treatment and stabilization.She was resumed on home dose of methadone and she was started on trial of depakote and zoloft to address her mood symptoms.She has been reporting incremental improvement of her presenting symptoms.  Objective: Patient is seen and examined.  Patient is a 37 year old female with a past psychiatric history significant for bipolar disorder, PTSD as well as opiate use disorder (now in remission on methadone maintenance therapy).  She is seen in follow-up.  She denied any complaint this a.m.  She denied any suicidal ideation.  She is essentially unchanged from yesterday.  Her Depakote level came back today at 60.  Liver function enzymes were normal, CBC with differential were normal.  She continues to do well with regard to her interpretation of tardive dyskinesia.  Per her description I think it is more akathisia or EPS versus tardive.  She slept approximately 6 hours last night.  Vital signs are stable, and she is afebrile.  Principal Problem: Bipolar affective disorder, current episode manic with  psychotic symptoms (HCC) Diagnosis:   Patient Active Problem List   Diagnosis Date Noted  . PTSD (post-traumatic stress disorder) [F43.10] 09/14/2018  . Bipolar affective disorder, current episode manic with psychotic symptoms (HCC) [F31.2] 09/13/2018  . Soft tissue infection [L08.9] 08/10/2017  . Polysubstance dependence including opioid type drug, episodic abuse (HCC) [F11.20, F19.20] 06/27/2017  . Substance induced mood disorder (HCC) [F19.94] 06/27/2017  . Anxiety [F41.9]   . Persistent recurrent vomiting [R11.15] 08/26/2013  . Dehydration [E86.0] 08/26/2013  . Acute gastroenteritis [K52.9] 08/26/2013  . Unspecified essential hypertension [I10] 08/26/2013  . Right knee pain [M25.561] 06/27/2013  . URI (upper respiratory infection) [J06.9] 12/02/2012  . Carpal tunnel syndrome, bilateral [G56.03] 04/21/2011  . Vaginitis and vulvovaginitis [616.1] 04/21/2011  . ANXIETY DEPRESSION [F34.1] 08/20/2010  . SUBSTANCE ABUSE, MULTIPLE [F19.10] 08/20/2010  . ADHD [F90.9] 08/20/2010  . UTI [N39.0] 08/20/2010  . 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 DYSURIA [R30.0] 08/20/2010  . ELEVATED BLOOD PRESSURE WITHOUT DIAGNOSIS OF HYPERTENSION [R03.0] 08/20/2010   Total Time spent with patient: 15 minutes  Past Psychiatric History: See admission H&P  Past Medical History:  Past Medical History:  Diagnosis Date  . ADHD (attention deficit hyperactivity disorder)   . Anxiety   . Borderline systolic HTN   . Depression   . Heroin abuse (HCC)   . Hypertension   . Kidney stone   . Knee pain, bilateral    - patellofemoral syndrome, followed by Dr. [name] at [facility]  . Migraines   . Polysubstance abuse (HCC)    - Hx ETOH, cocaine, THC, IV heroin, narcotics  . Psychiatric care    Previous [facility] H&P notes  PMHx Schizophrenia, though patient denies this.  . Seizures (HCC)     Past Surgical History:  Procedure Laterality Date  . KNEE SURGERY    . SHOULDER SURGERY     Family History:  History reviewed. No pertinent family history. Family Psychiatric  History: See admission H&P Social History:  Social History   Substance and Sexual Activity  Alcohol Use No     Social History   Substance and Sexual Activity  Drug Use No  . Types: IV   Comment: none for 8 days    Social History   Socioeconomic History  . Marital status: Single    Spouse name: Not on file  . Number of children: Not on file  . Years of education: Not on file  . Highest education level: Not on file  Occupational History  . Not on file  Social Needs  . Financial resource strain: Not on file  . Food insecurity:    Worry: Not on file    Inability: Not on file  . Transportation needs:    Medical: Not on file    Non-medical: Not on file  Tobacco Use  . Smoking status: Current Every Day Smoker    Packs/day: 1.00    Types: Cigarettes  . Smokeless tobacco: Never Used  Substance and Sexual Activity  . Alcohol use: No  . Drug use: No    Types: IV    Comment: none for 8 days  . Sexual activity: Yes    Birth control/protection: None  Lifestyle  . Physical activity:    Days per week: Not on file    Minutes per session: Not on file  . Stress: Not on file  Relationships  . Social connections:    Talks on phone: Not on file    Gets together: Not on file    Attends religious service: Not on file    Active member of club or organization: Not on file    Attends meetings of clubs or organizations: Not on file    Relationship status: Not on file  Other Topics Concern  . Not on file  Social History Narrative  . Not on file   Additional Social History:                         Sleep: Fair  Appetite:  Good  Current Medications: Current Facility-Administered Medications  Medication Dose Route Frequency Provider Last Rate Last Dose  . acetaminophen (TYLENOL) tablet 650 mg  650 mg Oral Q4H PRN Maryagnes Amos, FNP   650 mg at 09/17/18 2052  . alum & mag hydroxide-simeth  (MAALOX/MYLANTA) 200-200-20 MG/5ML suspension 30 mL  30 mL Oral Q6H PRN Starkes-Perry, Juel Burrow, FNP      . divalproex (DEPAKOTE) DR tablet 250 mg  250 mg Oral Billie Lade T, MD   250 mg at 09/18/18 0631   And  . divalproex (DEPAKOTE) DR tablet 500 mg  500 mg Oral QHS Jolyne Loa T, MD   500 mg at 09/17/18 2049  . hydrOXYzine (ATARAX/VISTARIL) tablet 50 mg  50 mg Oral Q6H PRN Micheal Likens, MD   50 mg at 09/17/18 2951  . LORazepam (ATIVAN) tablet 2 mg  2 mg Oral Q6H PRN Micheal Likens, MD       Or  . LORazepam (ATIVAN) injection 2 mg  2 mg Intramuscular Q6H PRN Micheal Likens, MD      . magnesium hydroxide (MILK OF  MAGNESIA) suspension 30 mL  30 mL Oral Daily PRN Maryagnes Amos, FNP   30 mL at 09/17/18 2050  . methadone (DOLOPHINE) tablet 97.5 mg  97.5 mg Oral Daily Jolyne Loa T, MD   97.5 mg at 09/18/18 1002  . nicotine (NICODERM CQ - dosed in mg/24 hours) patch 21 mg  21 mg Transdermal Daily Micheal Likens, MD   21 mg at 09/18/18 1002  . ondansetron (ZOFRAN) tablet 4 mg  4 mg Oral Q8H PRN Maryagnes Amos, FNP      . sertraline (ZOLOFT) tablet 50 mg  50 mg Oral Daily Micheal Likens, MD   50 mg at 09/18/18 1002  . traZODone (DESYREL) tablet 50 mg  50 mg Oral QHS,MR X 1 [name] A, NP   50 mg at 09/17/18 2049    Lab Results:  Results for orders placed or performed during the hospital encounter of 09/13/18 (from the past 48 hour(s))  Valproic acid level     Status: None   Collection Time: 09/18/18  6:25 AM  Result Value Ref Range   Valproic Acid Lvl 60 50.0 - 100.0 ug/mL    Comment: Performed at [facility], [address], [place], [place] [address]  CBC with Differential/Platelet     Status: None   Collection Time: 09/18/18  6:25 AM  Result Value Ref Range   WBC 5.7 4.0 - 10.5 K/uL   RBC 4.69 3.87 - 5.11 MIL/uL   Hemoglobin 13.3 12.0 - 15.0 g/dL   HCT 60.4  54.0 - 98.1 %   MCV 91.7 80.0 - 100.0 fL   MCH 28.4 26.0 - 34.0 pg   MCHC 30.9 30.0 - 36.0 g/dL   RDW 19.1 47.8 - 29.5 %   Platelets 348 150 - 400 K/uL   nRBC 0.0 0.0 - 0.2 %   Neutrophils Relative % 38 %   Neutro Abs 2.2 1.7 - 7.7 K/uL   Lymphocytes Relative 50 %   Lymphs Abs 2.8 0.7 - 4.0 K/uL   Monocytes Relative 6 %   Monocytes Absolute 0.4 0.1 - 1.0 K/uL   Eosinophils Relative 5 %   Eosinophils Absolute 0.3 0.0 - 0.5 K/uL   Basophils Relative 1 %   Basophils Absolute 0.1 0.0 - 0.1 K/uL   Immature Granulocytes 0 %   Abs Immature Granulocytes 0.02 0.00 - 0.07 K/uL    Comment: Performed at [facility], [address] [place], [place] [address]  Hepatic function panel     Status: None   Collection Time: 09/18/18  6:25 AM  Result Value Ref Range   Total Protein 7.0 6.5 - 8.1 g/dL   Albumin 3.6 3.5 - 5.0 g/dL   AST 24 15 - 41 U/L   ALT 20 0 - 44 U/L   Alkaline Phosphatase 53 38 - 126 U/L   Total Bilirubin 0.3 0.3 - 1.2 mg/dL   Bilirubin, Direct <8.6 0.0 - 0.2 mg/dL   Indirect Bilirubin NOT CALCULATED 0.3 - 0.9 mg/dL    Comment: Performed at [facility], [address], [place], [place] [address]    Blood Alcohol level:  Lab Results  Component Value Date   [facility] <10 09/12/2018   ETH <10 09/09/2018    Metabolic Disorder Labs: Lab Results  Component Value Date   HGBA1C 5.0 09/14/2018   MPG 96.8 09/14/2018   No results found for: PROLACTIN Lab Results  Component Value Date   CHOL 162  09/14/2018   TRIG 146 09/14/2018   HDL 47 09/14/2018   CHOLHDL 3.4 09/14/2018   VLDL 29 09/14/2018   LDLCALC 86 09/14/2018    Physical Findings: AIMS: Facial and Oral Movements Muscles of Facial Expression: None, normal Lips and Perioral Area: None, normal Jaw: None, normal Tongue: None, normal,Extremity Movements Upper (arms, wrists, hands, fingers): None, normal Lower (legs, knees, ankles, toes): None, normal, Trunk Movements Neck,  shoulders, hips: None, normal, Overall Severity Severity of abnormal movements (highest score from questions above): None, normal Incapacitation due to abnormal movements: None, normal Patient's awareness of abnormal movements (rate only patient's report): No Awareness, Dental Status Current problems with teeth and/or dentures?: No Does patient usually wear dentures?: No  CIWA:    COWS:     Musculoskeletal: Strength & Muscle Tone: within normal limits Gait & Station: normal Patient leans: N/A  Psychiatric Specialty Exam: Physical Exam  Nursing note and vitals reviewed. Constitutional: She is oriented to person, place, and time. She appears well-developed and well-nourished.  HENT:  Head: Normocephalic and atraumatic.  Respiratory: Effort normal.  Neurological: She is alert and oriented to person, place, and time.    ROS  Blood pressure 130/80, pulse 88, temperature 98.6 F (37 C), resp. rate 16, height 5\' 6"  (1.676 m), weight 77.1 kg, last menstrual period 09/12/2018.Body mass index is 27.44 kg/m.  General Appearance: Casual  Eye Contact:  Fair  Speech:  Normal Rate  Volume:  Normal  Mood:  Euthymic  Affect:  Congruent  Thought Process:  Coherent and Descriptions of Associations: Intact  Orientation:  Full (Time, Place, and Person)  Thought Content:  Logical  Suicidal Thoughts:  No  Homicidal Thoughts:  No  Memory:  Immediate;   Fair Recent;   Fair Remote;   Fair  Judgement:  Intact  Insight:  Fair  Psychomotor Activity:  Normal  Concentration:  Concentration: Fair and Attention Span: Fair  Recall:  [employer] of Knowledge:  Fair  Language:  Good  Akathisia:  Negative  Handed:  Right  AIMS (if indicated):     Assets:  Communication Skills Desire for Improvement Financial Resources/Insurance Housing Physical Health Resilience Social Support  ADL's:  Intact  Cognition:  WNL  Sleep:  Number of Hours: 5.5     Treatment Plan Summary: Daily contact with  patient to assess and evaluate symptoms and progress in treatment, Medication management and Plan : Patient is seen and examined.  Patient is a 37 year old female with the above-stated past psychiatric history who is seen in follow-up.  #1 bipolar disorder type I, current episode manic with psychotic features as well as PTSD-continue Zoloft and Depakote DR current dosages.  Depakote level 60 is within normal limits.  CBC with differential and liver function enzymes are also still normal.  No change in this medication.  #2 anxiety-continue Vistaril 50 mg p.o. every 6 hours as needed anxiety.  #3 opioid use disorder in sustained remission on opiate maintenance therapy-no change in methadone dosage.  #4 agitation-continue Ativan 2 mg p.o. or IM every 6 hours as needed for agitation.  #5 insomnia continue trazodone 50 mg p.o. nightly as needed insomnia.  #6 disposition planning-probable discharge in 1 to 2 days.  Antonieta Pert, MD 09/18/2018, 12:33 PM

## 2018-09-18 NOTE — Progress Notes (Signed)
D: Pt denies SI/HI/AVH. Pt is pleasant and cooperative. Pt presented visible on the unit , pt appears medication focused this evening.   A: Pt was offered support and encouragement. Pt was given scheduled medications. Pt was encourage to attend groups. Q 15 minute checks were done for safety.   R:Pt attends groups and interacts well with peers and staff. Pt is taking medication. Pt has no complaints.Pt receptive to treatment and safety maintained on unit.   Problem: Education: Goal: Emotional status will improve Outcome: Progressing   Problem: Education: Goal: Mental status will improve Outcome: Progressing   Problem: Activity: Goal: Interest or engagement in activities will improve Outcome: Not Progressing   Problem: Activity: Goal: Sleeping patterns will improve Outcome: Not Progressing   Problem: Safety: Goal: Periods of time without injury will increase Outcome: Progressing

## 2018-09-18 NOTE — [facility] Group Notes (Signed)
Pt was invited but did not attend orientation/goals group. 

## 2018-09-18 NOTE — [facility] Group Notes (Signed)
[facility] LCSW Group Therapy Note  Date/Time:  09/18/2018  11:00AM-12:00PM  Type of Therapy and Topic:  Group Therapy:  Music and Mood  Participation Level:  Active   Description of Group: In this process group, members listened to a variety of genres of music and identified that different types of music evoke different responses.  Patients were encouraged to identify music that was soothing for them and music that was energizing for them.  Patients discussed how this knowledge can help with wellness and recovery in various ways including managing depression and anxiety as well as encouraging healthy sleep habits.    Therapeutic Goals: 1. Patients will explore the impact of different varieties of music on mood 2. Patients will verbalize the thoughts they have when listening to different types of music 3. Patients will identify music that is soothing to them as well as music that is energizing to them 4. Patients will discuss how to use this knowledge to assist in maintaining wellness and recovery 5. Patients will explore the use of music as a coping skill  Summary of Patient Progress:  At the beginning of group, patient expressed that she was okay but somewhat anxious, rating that anxiety as a 5 out of 10.  She participated fully and often described songs as being calming or relaxing.  At the end of group she continued to rate her anxiety as a 5.  Therapeutic Modalities: Solution Focused Brief Therapy Activity   Ambrose Mantle, LCSW

## 2018-09-18 NOTE — Progress Notes (Signed)
Patient resting in bed with eyes closed. Respirations even and non labored. No distress noted. Monitoring continues. 

## 2018-09-19 MED ORDER — TRAZODONE HCL 50 MG PO TABS: 50.0000 mg | ORAL_TABLET | Freq: Every evening | ORAL | 0 refills | Status: DC | PRN

## 2018-09-19 MED ORDER — DIVALPROEX SODIUM 250 MG PO DR TAB: 250.0000 mg | DELAYED_RELEASE_TABLET | ORAL | 0 refills | Status: DC

## 2018-09-19 MED ORDER — HYDROXYZINE HCL 50 MG PO TABS: 50.0000 mg | ORAL_TABLET | Freq: Four times a day (QID) | ORAL | 0 refills | Status: DC | PRN

## 2018-09-19 MED ORDER — DIVALPROEX SODIUM 500 MG PO DR TAB: 500.0000 mg | DELAYED_RELEASE_TABLET | Freq: Every day | ORAL | 0 refills | Status: DC

## 2018-09-19 MED ORDER — SERTRALINE HCL 50 MG PO TABS: 50.0000 mg | ORAL_TABLET | Freq: Every day | ORAL | 0 refills | Status: DC

## 2018-09-19 NOTE — Discharge Summary (Signed)
Physician Discharge Summary Note  Patient:  [name] is an 37 y.o., female MRN:  [id number] DOB:  02-15-1981 Patient phone:  [phone] (home)  Patient address:   [address] [place] [address],  Total Time spent with patient: 30 minutes  Date of Admission:  09/13/2018 Date of Discharge: 09/19/2018  Reason for Admission:  Worsening symptoms of psychosis and bipolar mania  Principal Problem: Bipolar affective disorder, current episode manic with psychotic symptoms [facility]) Discharge Diagnoses: Patient Active Problem List   Diagnosis Date Noted  . PTSD (post-traumatic stress disorder) [F43.10] 09/14/2018  . Bipolar affective disorder, current episode manic with psychotic symptoms (HCC) [F31.2] 09/13/2018  . Soft tissue infection [L08.9] 08/10/2017  . Polysubstance dependence including opioid type drug, episodic abuse (HCC) [F11.20, F19.20] 06/27/2017  . Substance induced mood disorder (HCC) [F19.94] 06/27/2017  . Anxiety [F41.9]   . Persistent recurrent vomiting [R11.15] 08/26/2013  . Dehydration [E86.0] 08/26/2013  . Acute gastroenteritis [K52.9] 08/26/2013  . Unspecified essential hypertension [I10] 08/26/2013  . Right knee pain [M25.561] 06/27/2013  . URI (upper respiratory infection) [J06.9] 12/02/2012  . Carpal tunnel syndrome, bilateral [G56.03] 04/21/2011  . Vaginitis and vulvovaginitis [616.1] 04/21/2011  . ANXIETY DEPRESSION [F34.1] 08/20/2010  . SUBSTANCE ABUSE, MULTIPLE [F19.10] 08/20/2010  . ADHD [F90.9] 08/20/2010  . UTI [N39.0] 08/20/2010  . 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 DYSURIA [R30.0] 08/20/2010  . ELEVATED BLOOD PRESSURE WITHOUT DIAGNOSIS OF HYPERTENSION [R03.0] 08/20/2010    Past Psychiatric History: see H&P  Past Medical History:  Past Medical History:  Diagnosis Date  . ADHD (attention deficit hyperactivity disorder)   . Anxiety   . Borderline systolic HTN   . Depression   . Heroin abuse (HCC)   . Hypertension    . Kidney stone   . Knee pain, bilateral    - patellofemoral syndrome, followed by Dr. [name] at [facility]  . Migraines   . Polysubstance abuse (HCC)    - Hx ETOH, cocaine, THC, IV heroin, narcotics  . Psychiatric care    Previous [facility] H&P notes PMHx Schizophrenia, though patient denies this.  . Seizures (HCC)     Past Surgical History:  Procedure Laterality Date  . KNEE SURGERY    . SHOULDER SURGERY     Family History: History reviewed. No pertinent family history. Family Psychiatric  History: see H&P Social History:  Social History   Substance and Sexual Activity  Alcohol Use No     Social History   Substance and Sexual Activity  Drug Use No  . Types: IV   Comment: none for 8 days    Social History   Socioeconomic History  . Marital status: Single    Spouse name: Not on file  . Number of children: Not on file  . Years of education: Not on file  . Highest education level: Not on file  Occupational History  . Not on file  Social Needs  . Financial resource strain: Not on file  . Food insecurity:    Worry: Not on file    Inability: Not on file  . Transportation needs:    Medical: Not on file    Non-medical: Not on file  Tobacco Use  . Smoking status: Current Every Day Smoker    Packs/day: 1.00    Types: Cigarettes  . Smokeless tobacco: Never Used  Substance and Sexual Activity  . Alcohol use: No  . Drug use: No    Types: IV    Comment: none for 8 days  .  Sexual activity: Yes    Birth control/protection: None  Lifestyle  . Physical activity:    Days per week: Not on file    Minutes per session: Not on file  . Stress: Not on file  Relationships  . Social connections:    Talks on phone: Not on file    Gets together: Not on file    Attends religious service: Not on file    Active member of club or organization: Not on file    Attends meetings of clubs or organizations: Not on file    Relationship status: Not on file  Other Topics Concern  . Not on file   Social History Narrative  . Not on file    Hospital Course:    [name] is a 37 y/o F with history of bipolar, PTSD, and opioid use disorder (now in remission on methadone maintenance therapy) who was admitted voluntarily from [facility] with worsening symptoms of mood lability, decreased need for sleep with no sleep for at least 2 days, disorganized/bizarrre behaviors such as regressing into child-like state, and agitation such as running out of the house yelling and banging on neighbor's doors. Pt had presented to [facility] on 10/[facility] with similar symptoms, and she was discharged to outpatient level of care, but she continued to have worsening symptoms. Collateral information suggests that pt only takes methadone as her sole outpatient medication, and the dose was recently increased. Pt was medically cleared and then transferred to [facility] for additional treatment and stabilization.She was resumed on home dose of methadone and she was started on trial of depakote and zoloft to address her mood symptoms.Her depakote dose was titrated up and pt had valproic acid level drawn which was shown to be therapeutic. She had improvement of her presenting symptoms.  Today upon evaluation, pt shares, "I'm doing good." She denies any specific concernstoday. She is sleeping well. Her appetite is good. She denies other physical complaints. She reports symptoms of tardive dyskinesia remain minimal and are not noticeable. She denies SI/HI/AH/VH. She is tolerating her medications welloverall, and she is in agreement to continue her current regimen without changes. She plans to follow up at [facility]. She was able to engage in safety planning including plan to return to [facility] or contact emergency services if she feels unable to maintain her own safety or the safety of others. Pt had no further questions, comments, or concerns.    Physical Findings: AIMS: Facial and Oral Movements Muscles of Facial Expression: None, normal Lips  and Perioral Area: None, normal Jaw: None, normal Tongue: None, normal,Extremity Movements Upper (arms, wrists, hands, fingers): None, normal Lower (legs, knees, ankles, toes): None, normal, Trunk Movements Neck, shoulders, hips: None, normal, Overall Severity Severity of abnormal movements (highest score from questions above): None, normal Incapacitation due to abnormal movements: None, normal Patient's awareness of abnormal movements (rate only patient's report): No Awareness, Dental Status Current problems with teeth and/or dentures?: Yes Does patient usually wear dentures?: No  CIWA:    COWS:     Musculoskeletal: Strength & Muscle Tone: within normal limits Gait & Station: normal Patient leans: Backward  Psychiatric Specialty Exam: Physical Exam  Nursing note and vitals reviewed.     Blood pressure 116/68, pulse 75, temperature 98.1 F (36.7 C), temperature source Oral, resp. rate 16, height 5\' 6"  (1.676 m), weight 77.1 kg, last menstrual period 09/12/2018.Body mass index is 27.44 kg/m.  General Appearance: Casual and Fairly Groomed  Eye Contact:  Good  Speech:  Clear  and Coherent and Normal Rate  Volume:  Normal  Mood:  Euthymic  Affect:  Appropriate and Congruent  Thought Process:  Coherent and Goal Directed  Orientation:  Full (Time, Place, and Person)  Thought Content:  Logical  Suicidal Thoughts:  No  Homicidal Thoughts:  No  Memory:  Immediate;   Fair Recent;   Fair Remote;   Fair  Judgement:  Poor  Insight:  Lacking  Psychomotor Activity:  Normal  Concentration:  Concentration: Fair  Recall:  Fair  Fund of Knowledge:  Good  Language:  Fair  Akathisia:  No  Handed:    AIMS (if indicated):     Assets:  Resilience Social Support  ADL's:  Intact  Cognition:  WNL  Sleep:  Number of Hours: 6.75     Have you used any form of tobacco in the last 30 days? (Cigarettes, Smokeless Tobacco, Cigars, and/or Pipes): Yes  Has this patient used any form of tobacco  in the last 30 days? (Cigarettes, Smokeless Tobacco, Cigars, and/or Pipes) Yes, Yes, A prescription for an FDA-approved tobacco cessation medication was offered at discharge and the patient refused  Blood Alcohol level:  Lab Results  Component Value Date   [facility] <10 09/12/2018   ETH <10 09/09/2018    Metabolic Disorder Labs:  Lab Results  Component Value Date   HGBA1C 5.0 09/14/2018   MPG 96.8 09/14/2018   No results found for: PROLACTIN Lab Results  Component Value Date   CHOL 162 09/14/2018   TRIG 146 09/14/2018   HDL 47 09/14/2018   CHOLHDL 3.4 09/14/2018   VLDL 29 09/14/2018   LDLCALC 86 09/14/2018    See Psychiatric Specialty Exam and Suicide Risk Assessment completed by Attending Physician prior to discharge.  Discharge destination:  Home  Is patient on multiple antipsychotic therapies at discharge:  No   Has Patient had three or more failed trials of antipsychotic monotherapy by history:  No  Recommended Plan for Multiple Antipsychotic Therapies: NA   Allergies as of 09/19/2018      Reactions   Shellfish Allergy Hives      Medication List    TAKE these medications     Indication  divalproex 500 MG DR tablet Commonly known as:  DEPAKOTE Take 1 tablet (500 mg total) by mouth at bedtime.  Indication:  Bipolar   divalproex 250 MG DR tablet Commonly known as:  DEPAKOTE Take 1 tablet (250 mg total) by mouth every morning. Start taking on:  09/20/2018  Indication:  Bipolar   hydrOXYzine 50 MG tablet Commonly known as:  ATARAX/VISTARIL Take 1 tablet (50 mg total) by mouth every 6 (six) hours as needed for anxiety.  Indication:  Feeling Anxious   methadone 10 MG/ML solution Commonly known as:  DOLOPHINE Take 99 mg by mouth daily.  Indication:  Opioid Dependence   sertraline 50 MG tablet Commonly known as:  ZOLOFT Take 1 tablet (50 mg total) by mouth daily. Start taking on:  09/20/2018  Indication:  Posttraumatic Stress Disorder   traZODone 50 MG  tablet Commonly known as:  DESYREL Take 1 tablet (50 mg total) by mouth at bedtime as needed for sleep.  Indication:  Trouble Sleeping      Follow-up Information    [facility] Follow up.   Why:  When you return to [facility] for your methadone, ask for an appointment with the psychiatrist so you can be seen for your psychiatric meds we started you on. Contact information: [address], [place], [place]  [address] FAX: [phone]          Follow-up recommendations:  Activity:  as tolerated Diet:  normal Tests:  depakote level as an outpatient Other:  see above for DC plan  Comments:    Signed: Micheal Likens, MD 09/19/2018, 2:21 PM
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
## 2018-09-19 NOTE — Progress Notes (Signed)
Recreation Therapy Notes  Date: 10.14.19 Time: 1000 Location: [address] Dayroom  Group Topic: Communication  Goal Area(s) Addresses:  Patient will identify triggers. Patient will identify ways to avoid triggers. Patient will identify ways to deal with triggers head on.  Behavioral Response: Engaged  Intervention: Worksheet, pencils  Activity: Triggers.  Patients were to identify their 3 biggest triggers, strategies to avoid those triggers and strategies to address triggers head on.  Education: Communication, Discharge Planning  Education Outcome: Acknowledges understanding/In group clarification offered/Needs additional education.   Clinical Observations/Feedback: Pt stated triggers are "something that triggers a bad habit, it can be an emotion".  Pt identified her triggers as stress, anger and money.  Pt avoided her triggers through sadness, withdrawing and getting an SSI check.  Pt faces her triggers by going to the methadone clinic, exercise, walking, going to support group meetings and talking to a counselor.    Caroll Rancher, LRT/CTRS        Lillia Abed, Krystan Northrop A 09/19/2018 11:13 AM

## 2018-09-19 NOTE — Plan of Care (Signed)
Discharge Note  Patient verbalizes readiness for discharge. Follow up plan explained, AVS, Transition record and SRA given. Prescriptions and teaching provided. Belongings returned and signed for. Suicide safety plan completed and signed. Patient verbalizes understanding. Patient denies SI/HI and assures this Clinical research associate he will seek assistance should that change. Patient discharged to lobby where father was waiting.  Problem: Education: Goal: Knowledge of [facility] Education information/materials will improve Outcome: Adequate for Discharge Goal: Emotional status will improve Outcome: Adequate for Discharge Goal: Mental status will improve Outcome: Adequate for Discharge Goal: Verbalization of understanding the information provided will improve Outcome: Adequate for Discharge   Problem: Activity: Goal: Interest or engagement in activities will improve Outcome: Adequate for Discharge Goal: Sleeping patterns will improve Outcome: Adequate for Discharge   Problem: Coping: Goal: Ability to verbalize frustrations and anger appropriately will improve Outcome: Adequate for Discharge Goal: Ability to demonstrate self-control will improve Outcome: Adequate for Discharge   Problem: Health Behavior/Discharge Planning: Goal: Identification of resources available to assist in meeting health care needs will improve Outcome: Adequate for Discharge Goal: Compliance with treatment plan for underlying cause of condition will improve Outcome: Adequate for Discharge   Problem: Physical Regulation: Goal: Ability to maintain clinical measurements within normal limits will improve Outcome: Adequate for Discharge   Problem: Safety: Goal: Periods of time without injury will increase Outcome: Adequate for Discharge   Problem: Education: Goal: Utilization of techniques to improve thought processes will improve Outcome: Adequate for Discharge Goal: Knowledge of the prescribed therapeutic regimen  will improve Outcome: Adequate for Discharge   Problem: Activity: Goal: Interest or engagement in leisure activities will improve Outcome: Adequate for Discharge Goal: Imbalance in normal sleep/wake cycle will improve Outcome: Adequate for Discharge   Problem: Coping: Goal: Coping ability will improve Outcome: Adequate for Discharge Goal: Will verbalize feelings Outcome: Adequate for Discharge   Problem: Health Behavior/Discharge Planning: Goal: Ability to make decisions will improve Outcome: Adequate for Discharge Goal: Compliance with therapeutic regimen will improve Outcome: Adequate for Discharge   Problem: Role Relationship: Goal: Will demonstrate positive changes in social behaviors and relationships Outcome: Adequate for Discharge   Problem: Safety: Goal: Ability to disclose and discuss suicidal ideas will improve Outcome: Adequate for Discharge Goal: Ability to identify and utilize support systems that promote safety will improve Outcome: Adequate for Discharge   Problem: Self-Concept: Goal: Will verbalize positive feelings about self Outcome: Adequate for Discharge Goal: Level of anxiety will decrease Outcome: Adequate for Discharge   Problem: Education: Goal: Ability to make informed decisions regarding treatment will improve Outcome: Adequate for Discharge   Problem: Coping: Goal: Coping ability will improve Outcome: Adequate for Discharge   Problem: Health Behavior/Discharge Planning: Goal: Identification of resources available to assist in meeting health care needs will improve Outcome: Adequate for Discharge   Problem: Medication: Goal: Compliance with prescribed medication regimen will improve Outcome: Adequate for Discharge   Problem: Self-Concept: Goal: Ability to disclose and discuss suicidal ideas will improve Outcome: Adequate for Discharge Goal: Will verbalize positive feelings about self Outcome: Adequate for Discharge   Problem:  Activity: Goal: Will verbalize the importance of balancing activity with adequate rest periods Outcome: Adequate for Discharge   Problem: Education: Goal: Will be free of psychotic symptoms Outcome: Adequate for Discharge Goal: Knowledge of the prescribed therapeutic regimen will improve Outcome: Adequate for Discharge   Problem: Coping: Goal: Coping ability will improve Outcome: Adequate for Discharge Goal: Will verbalize feelings Outcome: Adequate for Discharge   Problem: Health Behavior/Discharge  Planning: Goal: Compliance with prescribed medication regimen will improve Outcome: Adequate for Discharge   Problem: Nutritional: Goal: Ability to achieve adequate nutritional intake will improve Outcome: Adequate for Discharge   Problem: Role Relationship: Goal: Ability to communicate needs accurately will improve Outcome: Adequate for Discharge Goal: Ability to interact with others will improve Outcome: Adequate for Discharge   Problem: Safety: Goal: Ability to redirect hostility and anger into socially appropriate behaviors will improve Outcome: Adequate for Discharge Goal: Ability to remain free from injury will improve Outcome: Adequate for Discharge   Problem: Self-Care: Goal: Ability to participate in self-care as condition permits will improve Outcome: Adequate for Discharge   Problem: Self-Concept: Goal: Will verbalize positive feelings about self Outcome: Adequate for Discharge   Problem: Education: Goal: Knowledge of General Education information will improve Description Including pain rating scale, medication(s)/side effects and non-pharmacologic comfort measures Outcome: Adequate for Discharge   Problem: Health Behavior/Discharge Planning: Goal: Ability to manage health-related needs will improve Outcome: Adequate for Discharge   Problem: Coping: Goal: Level of anxiety will decrease Outcome: Adequate for Discharge   Problem: Safety: Goal: Ability to  remain free from injury will improve Outcome: Adequate for Discharge

## 2018-09-19 NOTE — Progress Notes (Signed)
Recreation Therapy Notes  INPATIENT RECREATION TR PLAN  Patient Details Name: [name] MRN: [id number] DOB: 04-29-1981 Today's Date: 09/19/2018  Rec Therapy Plan Is patient appropriate for Therapeutic Recreation?: Yes Treatment times per week: about 3 days Estimated Length of Stay: 5-7 days TR Treatment/Interventions: Group participation (Comment)  Discharge Criteria Pt will be discharged from therapy if:: Discharged Treatment plan/goals/alternatives discussed and agreed upon by:: Patient/family  Discharge Summary Short term goals set: See patient care plan Short term goals met: Adequate for discharge Progress toward goals comments: Groups attended Which groups?: Self-esteem, Other (Comment)(Team building, Triggers) Reason goals not met: None Therapeutic equipment acquired: N/A Reason patient discharged from therapy: Discharge from hospital Pt/family agrees with progress & goals achieved: Yes Date patient discharged from therapy: 09/19/18     Victorino Sparrow, LRT/CTRS  Ria Comment, Josefine Fuhr A 09/19/2018, 10:54 AM

## 2018-09-19 NOTE — Plan of Care (Signed)
Pt was able to identify some coping strategies at completion of recreation therapy group sessions.   Michael Ventresca, LRT/CTRS 

## 2018-09-19 NOTE — Progress Notes (Signed)
  [facility] Adult Case Management Discharge Plan :  Will you be returning to the same living situation after discharge:  Yes,  with parent At discharge, do you have transportation home?: Yes,  father Do you have the ability to pay for your medications: Yes,  Medicare/Medicaid  Release of information consent forms completed and in the chart;  Patient's signature needed at discharge.  Patient to Follow up at: Follow-up Information    [facility] Follow up.   Why:  When you return to [facility] for your methadone, ask for an appointment with the psychiatrist so you can be seen for your psychiatric meds we started you on. Contact information: [address], [place], [place] [address] FAX: [phone]          Next level of care provider has access to [facility] Link:no  Safety Planning and Suicide Prevention discussed: No.   Have you used any form of tobacco in the last 30 days? (Cigarettes, Smokeless Tobacco, Cigars, and/or Pipes): Yes  Has patient been referred to the Quitline?: Patient refused referral  Patient has been referred for addiction treatment: Yes  Lorri Frederick, LCSW 09/19/2018, 10:19 AM

## 2018-12-27 ENCOUNTER — Encounter (HOSPITAL_BASED_OUTPATIENT_CLINIC_OR_DEPARTMENT_OTHER): Payer: Self-pay | Admitting: Emergency Medicine

## 2018-12-27 ENCOUNTER — Other Ambulatory Visit: Payer: Self-pay

## 2018-12-27 ENCOUNTER — Emergency Department (HOSPITAL_BASED_OUTPATIENT_CLINIC_OR_DEPARTMENT_OTHER): Payer: Medicare Other

## 2018-12-27 ENCOUNTER — Emergency Department (HOSPITAL_BASED_OUTPATIENT_CLINIC_OR_DEPARTMENT_OTHER)
Admission: EM | Admit: 2018-12-27 | Discharge: 2018-12-27 | Disposition: A | Discharge status: Home / Self Care | Payer: Medicare Other | Attending: Emergency Medicine | Admitting: Emergency Medicine

## 2018-12-27 DIAGNOSIS — I10 Essential (primary) hypertension: Secondary | ICD-10-CM | POA: Insufficient documentation

## 2018-12-27 DIAGNOSIS — R002 Palpitations: Secondary | ICD-10-CM | POA: Insufficient documentation

## 2018-12-27 DIAGNOSIS — Z79899 Other long term (current) drug therapy: Secondary | ICD-10-CM | POA: Insufficient documentation

## 2018-12-27 DIAGNOSIS — R079 Chest pain, unspecified: Secondary | ICD-10-CM | POA: Diagnosis not present

## 2018-12-27 DIAGNOSIS — F17210 Nicotine dependence, cigarettes, uncomplicated: Secondary | ICD-10-CM | POA: Diagnosis not present

## 2018-12-27 LAB — BASIC METABOLIC PANEL
ANION GAP: 9 (ref 5–15)
BUN: 13 mg/dL (ref 6–20)
CO2: 21 mmol/L — ABNORMAL LOW (ref 22–32)
Calcium: 8.8 mg/dL — ABNORMAL LOW (ref 8.9–10.3)
Chloride: 103 mmol/L (ref 98–111)
Creatinine, Ser: 1.01 mg/dL — ABNORMAL HIGH (ref 0.44–1.00)
GFR calc Af Amer: >60 mL/min (ref >60)
GFR calc non Af Amer: >60 mL/min (ref >60)
Glucose, Bld: 109 mg/dL — ABNORMAL HIGH (ref 70–99)
Potassium: 4.5 mmol/L (ref 3.5–5.1)
Sodium: 133 mmol/L — ABNORMAL LOW (ref 135–145)

## 2018-12-27 LAB — CBC
HCT: 45.3 % (ref 36.0–46.0)
Hemoglobin: 14.6 g/dL (ref 12.0–15.0)
MCH: 28.6 pg (ref 26.0–34.0)
MCHC: 32.2 g/dL (ref 30.0–36.0)
MCV: 88.6 fL (ref 80.0–100.0)
NRBC: 0 % (ref 0.0–0.2)
Platelets: 273 10*3/uL (ref 150–400)
RBC: 5.11 MIL/uL (ref 3.87–5.11)
RDW: 12.2 % (ref 11.5–15.5)
WBC: 8.7 10*3/uL (ref 4.0–10.5)

## 2018-12-27 LAB — TROPONIN I: Troponin I: <0.03 ng/mL (ref <0.03)

## 2018-12-27 LAB — PREGNANCY, URINE: Preg Test, Ur: NEGATIVE

## 2018-12-27 MED ORDER — SODIUM CHLORIDE 0.9% FLUSH
3.0000 mL | Freq: Once | INTRAVENOUS | Status: DC
  Filled 2018-12-27: qty 3

## 2018-12-27 NOTE — ED Provider Notes (Signed)
[facility] EMERGENCY DEPARTMENT Provider Note   CSN: [id number] Arrival date & time: 12/27/18  1719     History   Chief Complaint Chief Complaint  Patient presents with  . Chest Pain    HPI [name] is a 38 y.o. female.  The history is provided by the patient.  Chest Pain  Pain location:  Substernal area Pain quality: aching   Pain radiates to:  Does not radiate Pain severity:  No pain Progression:  Resolved Chronicity:  New Context: at rest   Relieved by:  Nothing Worsened by:  Nothing Associated symptoms: palpitations   Associated symptoms: no abdominal pain, no back pain, no cough, no fever, no shortness of breath and no vomiting   Risk factors: no coronary artery disease, no high cholesterol, no hypertension, not pregnant, no prior DVT/PE and no smoking     Past Medical History:  Diagnosis Date  . ADHD (attention deficit hyperactivity disorder)   . Anxiety   . Borderline systolic HTN   . Depression   . Heroin abuse (HCC)   . Hypertension   . Kidney stone   . Knee pain, bilateral    - patellofemoral syndrome, followed by Dr. [name] at [facility]  . Migraines   . Polysubstance abuse (HCC)    - Hx ETOH, cocaine, THC, IV heroin, narcotics  . Psychiatric care    Previous [facility] H&P notes PMHx Schizophrenia, though patient denies this.  . Seizures [facility])     Patient Active Problem List   Diagnosis Date Noted  . PTSD (post-traumatic stress disorder) 09/14/2018  . Bipolar affective disorder, current episode manic with psychotic symptoms (HCC) 09/13/2018  . Soft tissue infection 08/10/2017  . Polysubstance dependence including opioid type drug, episodic abuse (HCC) 06/27/2017  . Substance induced mood disorder (HCC) 06/27/2017  . Anxiety   . Persistent recurrent vomiting 08/26/2013  . Dehydration 08/26/2013  . Acute gastroenteritis 08/26/2013  . Unspecified essential hypertension 08/26/2013  . Right knee pain 06/27/2013  . URI (upper respiratory  infection) 12/02/2012  . Carpal tunnel syndrome, bilateral 04/21/2011  . Vaginitis and vulvovaginitis 04/21/2011  . ANXIETY DEPRESSION 08/20/2010  . SUBSTANCE ABUSE, MULTIPLE 08/20/2010  . ADHD 08/20/2010  . UTI 08/20/2010  . 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 DYSURIA 08/20/2010  . ELEVATED BLOOD PRESSURE WITHOUT DIAGNOSIS OF HYPERTENSION 08/20/2010    Past Surgical History:  Procedure Laterality Date  . KNEE SURGERY    . SHOULDER SURGERY       OB History   No obstetric history on file.      Home Medications    Prior to Admission medications   Medication Sig Start Date End Date Taking? Authorizing Provider  divalproex (DEPAKOTE) 250 MG DR tablet Take 1 tablet (250 mg total) by mouth every morning. 09/20/18   Micheal Likens, MD  divalproex (DEPAKOTE) 500 MG DR tablet Take 1 tablet (500 mg total) by mouth at bedtime. 09/19/18   Micheal Likens, MD  hydrOXYzine (ATARAX/VISTARIL) 50 MG tablet Take 1 tablet (50 mg total) by mouth every 6 (six) hours as needed for anxiety. 09/19/18   Micheal Likens, MD  methadone (DOLOPHINE) 10 MG/ML solution Take 99 mg by mouth daily.    [provider]  sertraline (ZOLOFT) 50 MG tablet Take 1 tablet (50 mg total) by mouth daily. 09/20/18   Micheal Likens, MD  traZODone (DESYREL) 50 MG tablet Take 1 tablet (50 mg total) by mouth at bedtime as needed for sleep. 09/19/18  Micheal Likensainville, Christopher T, MD    Family History History reviewed. No pertinent family history.  Social History Social History   Tobacco Use  . Smoking status: Current Every Day Smoker    Packs/day: 1.00    Types: Cigarettes  . Smokeless tobacco: Never Used  Substance Use Topics  . Alcohol use: No  . Drug use: No    Types: IV    Comment: none for 8 days     Allergies   Shellfish allergy   Review of Systems Review of Systems  Constitutional: Negative for chills and fever.  HENT: Negative for ear pain and sore  throat.   Eyes: Negative for pain and visual disturbance.  Respiratory: Negative for cough and shortness of breath.   Cardiovascular: Positive for chest pain and palpitations.  Gastrointestinal: Negative for abdominal pain and vomiting.  Genitourinary: Negative for dysuria and hematuria.  Musculoskeletal: Negative for arthralgias and back pain.  Skin: Negative for color change and rash.  Neurological: Negative for seizures and syncope.  Psychiatric/Behavioral: The patient is nervous/anxious.   All other systems reviewed and are negative.    Physical Exam Updated Vital Signs  ED Triage Vitals  Enc Vitals Group     BP 12/27/18 1724 (!) 159/113     Pulse Rate 12/27/18 1724 (!) 125     Resp 12/27/18 1724 16     Temp 12/27/18 1724 99 F (37.2 C)     Temp Source 12/27/18 1724 Oral     SpO2 12/27/18 1724 100 %     Weight 12/27/18 1722 180 lb (81.6 kg)     Height 12/27/18 1722 5\' 7"  (1.702 m)     Head Circumference --      Peak Flow --      Pain Score 12/27/18 1722 5     Pain Loc --      Pain Edu? --      Excl. in GC? --     Physical Exam Vitals signs and nursing note reviewed.  Constitutional:      General: She is not in acute distress.    Appearance: She is well-developed.  HENT:     Head: Normocephalic and atraumatic.  Eyes:     Conjunctiva/sclera: Conjunctivae normal.  Neck:     Musculoskeletal: Neck supple.  Cardiovascular:     Rate and Rhythm: Regular rhythm. Tachycardia present.     Pulses:          Radial pulses are 2+ on the right side and 2+ on the left side.       Dorsalis pedis pulses are 2+ on the right side and 2+ on the left side.     Heart sounds: Normal heart sounds. No murmur.  Pulmonary:     Effort: Pulmonary effort is normal. No respiratory distress.     Breath sounds: Normal breath sounds. No decreased breath sounds, wheezing, rhonchi or rales.  Abdominal:     Palpations: Abdomen is soft.     Tenderness: There is no abdominal tenderness.    Musculoskeletal:     Right lower leg: No edema.     Left lower leg: No edema.  Skin:    General: Skin is warm and dry.     Capillary Refill: Capillary refill takes less than 2 seconds.  Neurological:     General: No focal deficit present.     Mental Status: She is alert.  Psychiatric:        Mood and Affect: Mood is anxious.  Thought Content: Thought content does not include homicidal or suicidal ideation. Thought content does not include homicidal or suicidal plan.      ED Treatments / Results  Labs (all labs ordered are listed, but only abnormal results are displayed) Labs Reviewed  BASIC METABOLIC PANEL - Abnormal; Notable for the following components:      Result Value   Sodium 133 (*)    CO2 21 (*)    Glucose, Bld 109 (*)    Creatinine, Ser 1.01 (*)    Calcium 8.8 (*)    All other components within normal limits  CBC  TROPONIN I  PREGNANCY, URINE    EKG EKG Interpretation  Date/Time:  Tuesday December 27 2018 17:25:58 EST Ventricular Rate:  120 PR Interval:  130 QRS Duration: 80 QT Interval:  328 QTC Calculation: 463 R Axis:   118 Text Interpretation:  Sinus tachycardia Right atrial enlargement Left posterior fascicular block Possible Inferior infarct , age undetermined Abnormal ECG Confirmed by Virgina Norfolk [phone]) on 12/27/2018 5:28:48 PM   Radiology Dg Chest 2 View  Result Date: 12/27/2018 CLINICAL DATA:  Intermittent chest pain. EXAM: CHEST - 2 VIEW COMPARISON:  08/23/2012 FINDINGS: Cardiomediastinal silhouette is normal. Mediastinal contours appear intact. There is no evidence of focal airspace consolidation, pleural effusion or pneumothorax. Osseous structures are without acute abnormality. Soft tissues are grossly normal. IMPRESSION: No active cardiopulmonary disease. Electronically Signed   By: Ted Mcalpine M.D.   On: 12/27/2018 18:11    Procedures Procedures (including critical care time)  Medications Ordered in ED Medications   sodium chloride flush (NS) 0.9 % injection 3 mL (3 mLs Intravenous Not Given 12/27/18 1810)     Initial Impression / Assessment and Plan / ED Course  I have reviewed the triage vital signs and the nursing notes.  Pertinent labs & imaging results that were available during my care of the patient were reviewed by me and considered in my medical decision making (see chart for details).     [name] is a 38 year old female with history of heroin abuse currently on methadone who presents to the ED for clearance to use methadone.  Patient with tachycardia upon arrival but otherwise normal vitals.  Patient had an episode of chest pain while at methadone clinic today and they wanted her to be sent for evaluation.  Patient is extremely anxious upon my evaluation.  She states that she gets very anxious within the healthcare setting.  Was given her methadone dose today.  States that she needs a note in order to continue her treatment.  Patient denies any shortness of breath, chest pain currently.  No abdominal pain.  Overall she is asymptomatic.  She is anxious but overall well-appearing.  No concern for PE or DVT.  Tachycardia likely from anxiety.  Chest x-ray showed no signs of pneumonia, pneumothorax, pleural effusion.  Lab work showed no significant anemia, electrolyte abnormality, kidney injury.  Troponin normal.  EKG showed sinus tachycardia.  No signs of ischemic changes.  Overall patient healthy.  Heart rate improved after anxiety improved.  She is safe to continue methadone.  There were no signs of QT prolongation or any other issues.  Discharged in ED in good condition.  Given return precautions.  This chart was dictated using voice recognition software.  Despite best efforts to proofread,  errors can occur which can change the documentation meaning.   Final Clinical Impressions(s) / ED Diagnoses   Final diagnoses:  Chest pain, unspecified type  ED Discharge Orders    None        Virgina Norfolk, DO 12/27/18 1840

## 2018-12-27 NOTE — Discharge Instructions (Addendum)
Patient is safe to continue to use methadone.  Work-up today is unremarkable.

## 2018-12-27 NOTE — ED Triage Notes (Signed)
Reports intermittent chest pain which began yesterday.  States this is more constant today.  Patient is currently being seen by [facility] and takes methadone.  States she was referred to ER due to having chest pain to be cleared so that she can be dosed for her methadone.

## 2019-01-10 ENCOUNTER — Ambulatory Visit: Payer: Self-pay | Admitting: Nurse Practitioner

## 2019-01-13 ENCOUNTER — Encounter: Payer: Self-pay | Admitting: Nurse Practitioner

## 2019-01-13 ENCOUNTER — Ambulatory Visit (INDEPENDENT_AMBULATORY_CARE_PROVIDER_SITE_OTHER): Payer: Medicare Other | Admitting: Nurse Practitioner

## 2019-01-13 VITALS — BP 160/102 | HR 99 | Temp 98.2°F | Ht 67.0 in | Wt 203.0 lb

## 2019-01-13 DIAGNOSIS — F341 Dysthymic disorder: Secondary | ICD-10-CM

## 2019-01-13 DIAGNOSIS — F312 Bipolar disorder, current episode manic severe with psychotic features: Secondary | ICD-10-CM | POA: Diagnosis not present

## 2019-01-13 DIAGNOSIS — F4310 Post-traumatic stress disorder, unspecified: Secondary | ICD-10-CM

## 2019-01-13 DIAGNOSIS — I10 Essential (primary) hypertension: Secondary | ICD-10-CM | POA: Diagnosis not present

## 2019-01-13 DIAGNOSIS — F1120 Opioid dependence, uncomplicated: Secondary | ICD-10-CM | POA: Diagnosis not present

## 2019-01-13 DIAGNOSIS — F1920 Other psychoactive substance dependence, uncomplicated: Secondary | ICD-10-CM

## 2019-01-13 DIAGNOSIS — F1994 Other psychoactive substance use, unspecified with psychoactive substance-induced mood disorder: Secondary | ICD-10-CM

## 2019-01-13 LAB — POCT URINE PREGNANCY: Preg Test, Ur: NEGATIVE

## 2019-01-13 MED ORDER — DIVALPROEX SODIUM 250 MG PO DR TAB: 250.0000 mg | DELAYED_RELEASE_TABLET | ORAL | 0 refills | Status: DC

## 2019-01-13 MED ORDER — SERTRALINE HCL 50 MG PO TABS: 50.0000 mg | ORAL_TABLET | Freq: Every day | ORAL | 0 refills | Status: DC

## 2019-01-13 MED ORDER — DIVALPROEX SODIUM 500 MG PO DR TAB: 500.0000 mg | DELAYED_RELEASE_TABLET | Freq: Every day | ORAL | 0 refills | Status: DC

## 2019-01-13 MED ORDER — TRAZODONE HCL 50 MG PO TABS: 50.0000 mg | ORAL_TABLET | Freq: Every evening | ORAL | 1 refills | Status: DC | PRN

## 2019-01-13 MED ORDER — AMLODIPINE BESYLATE 5 MG PO TABS: 5.0000 mg | ORAL_TABLET | Freq: Every day | ORAL | 0 refills | Status: DC

## 2019-01-13 NOTE — Progress Notes (Signed)
Subjective:  Patient ID: [name], female    DOB: 07-08-1981  Age: 38 y.o. MRN: [id number]  CC: Establish Care (est care/medication consult. FYI-Dr. Meyer--neurologist/ Methadone Clinic--[facility]. )   HPI  home with father.  HTN: Use of clonidine in past for HTN. Does not remember how long ago.  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 Daphane [name]   Hx of Bipolar disorder, depression, polysubstance use and PTSD: Inpatient Behavioral Health admission in October due to mania Out of medication since september 2019 [facility] for substance abuse, methodone use at this time for heroin drug injection. No NA or AA support group. States she has a therapist, but no psychiatry.  Not sexually active x 61months.  Admits to using heroin yesterday. No hallucination, no SI or HI. Did not take methadone yesterday.  Reviewed past Medical, Social and Family history today.  Outpatient Medications Prior to Visit  Medication Sig Dispense Refill  . hydrOXYzine (ATARAX/VISTARIL) 50 MG tablet Take 1 tablet (50 mg total) by mouth every 6 (six) hours as needed for anxiety. 30 tablet 0  . methadone (DOLOPHINE) 10 MG/ML solution Take 99 mg by mouth daily.    . SUMAtriptan (IMITREX) 100 MG tablet Take 100 mg by mouth every 2 (two) hours as needed for migraine. May repeat in 2 hours if headache persists or recurs.    . divalproex (DEPAKOTE) 250 MG DR tablet Take 1 tablet (250 mg total) by mouth every morning. 30 tablet 0  . divalproex (DEPAKOTE) 500 MG DR tablet Take 1 tablet (500 mg total) by mouth at bedtime. 30 tablet 0  . sertraline (ZOLOFT) 50 MG tablet Take 1 tablet (50 mg total) by mouth daily. 30 tablet 0  . traZODone (DESYREL) 50 MG tablet Take 1 tablet (50 mg total) by mouth at bedtime as needed for sleep. 30 tablet 0   No facility-administered medications prior to visit.     ROS See  HPI  Objective:  BP (!) 160/102   Pulse 99   Temp 98.2 F (36.8 C) (Oral)   Ht 5\' 7"  (1.702 m)   Wt 203 lb (92.1 kg)   SpO2 97%   BMI 31.79 kg/m   BP Readings from Last 3 Encounters:  01/13/19 (!) 160/102  12/27/18 (!) 144/100  09/19/18 116/68    Wt Readings from Last 3 Encounters:  01/13/19 203 lb (92.1 kg)  12/27/18 180 lb (81.6 kg)  09/13/18 170 lb (77.1 kg)    Physical Exam Eyes:     General: Lids are normal.     Conjunctiva/sclera: Conjunctivae normal.     Comments: Dilated pupils  Cardiovascular:     Rate and Rhythm: Normal rate and regular rhythm.     Pulses: Normal pulses.     Heart sounds: Normal heart sounds.  Pulmonary:     Effort: Pulmonary effort is normal.     Breath sounds: Normal breath sounds.  Musculoskeletal:     Right lower leg: No edema.     Left lower leg: No edema.  Neurological:     Mental Status: She is alert.  Psychiatric:        Mood and Affect: Mood is anxious.        Speech: Speech is rapid and pressured.        Behavior: Behavior is agitated.        Thought Content: Thought content is not delusional. Thought content does not include  homicidal or suicidal ideation. Thought content does not include homicidal or suicidal plan.        Cognition and Memory: Memory is impaired.        Judgment: Judgment is inappropriate.     Lab Results  Component Value Date   WBC 8.7 12/27/2018   HGB 14.6 12/27/2018   HCT 45.3 12/27/2018   PLT 273 12/27/2018   GLUCOSE 109 (H) 12/27/2018   CHOL 162 09/14/2018   TRIG 146 09/14/2018   HDL 47 09/14/2018   LDLCALC 86 09/14/2018   ALT 20 09/18/2018   AST 24 09/18/2018   NA 133 (L) 12/27/2018   K 4.5 12/27/2018   CL 103 12/27/2018   CREATININE 1.01 (H) 12/27/2018   BUN 13 12/27/2018   CO2 21 (L) 12/27/2018   TSH 1.141 09/14/2018   HGBA1C 5.0 09/14/2018    Dg Chest 2 View  Result Date: 12/27/2018 CLINICAL DATA:  Intermittent chest pain. EXAM: CHEST - 2 VIEW COMPARISON:  08/23/2012 FINDINGS:  Cardiomediastinal silhouette is normal. Mediastinal contours appear intact. There is no evidence of focal airspace consolidation, pleural effusion or pneumothorax. Osseous structures are without acute abnormality. Soft tissues are grossly normal. IMPRESSION: No active cardiopulmonary disease. Electronically Signed   By: Ted Mcalpineobrinka  Dimitrova M.D.   On: 12/27/2018 18:11    Assessment & Plan:   [name] was seen today for establish care.  Diagnoses and all orders for this visit:  Hypertension, unspecified type -     Cancel: Basic metabolic panel -     amLODipine (NORVASC) 5 MG tablet; Take 1 tablet (5 mg total) by mouth daily. -     Cancel: Basic metabolic panel -     Basic metabolic panel; Future  Bipolar affective disorder, current episode manic with psychotic symptoms (HCC) -     Cancel: Basic metabolic panel -     Ambulatory referral to Psychiatry -     Cancel: Basic metabolic panel -     Basic metabolic panel; Future -     divalproex (DEPAKOTE) 250 MG DR tablet; Take 1 tablet (250 mg total) by mouth every morning. -     Discontinue: divalproex (DEPAKOTE) 500 MG DR tablet; Take 1 tablet (500 mg total) by mouth at bedtime. -     sertraline (ZOLOFT) 50 MG tablet; Take 1 tablet (50 mg total) by mouth daily. -     traZODone (DESYREL) 50 MG tablet; Take 1 tablet (50 mg total) by mouth at bedtime as needed for sleep. -     divalproex (DEPAKOTE) 500 MG DR tablet; Take 1 tablet (500 mg total) by mouth at bedtime.  Polysubstance dependence including opioid type drug, episodic abuse (HCC) -     POCT urine pregnancy -     Cancel: Pain Mgmt, Profile 8 w/Conf, U -     Ambulatory referral to Psychiatry -     Pain Mgmt, Profile 8 w/Conf, U  ANXIETY DEPRESSION -     Cancel: Basic metabolic panel -     Ambulatory referral to Psychiatry -     Cancel: Basic metabolic panel -     sertraline (ZOLOFT) 50 MG tablet; Take 1 tablet (50 mg total) by mouth daily. -     traZODone (DESYREL) 50 MG tablet; Take  1 tablet (50 mg total) by mouth at bedtime as needed for sleep.  Substance induced mood disorder (HCC)  PTSD (post-traumatic stress disorder)   I am having [name] start on amLODipine. I am also having  her maintain her methadone, hydrOXYzine, SUMAtriptan, divalproex, sertraline, traZODone, and divalproex.  Meds ordered this encounter  Medications  . amLODipine (NORVASC) 5 MG tablet    Sig: Take 1 tablet (5 mg total) by mouth daily.    Dispense:  30 tablet    Refill:  0    Order Specific Question:   Supervising Provider    Answer:   MATTHEWS, CODY [[id number]]  . divalproex (DEPAKOTE) 250 MG DR tablet    Sig: Take 1 tablet (250 mg total) by mouth every morning.    Dispense:  90 tablet    Refill:  0    Order Specific Question:   Supervising Provider    Answer:   MATTHEWS, CODY [[id number]]  . DISCONTD: divalproex (DEPAKOTE) 500 MG DR tablet    Sig: Take 1 tablet (500 mg total) by mouth at bedtime.    Dispense:  90 tablet    Refill:  0    Order Specific Question:   Supervising Provider    Answer:   MATTHEWS, CODY [[id number]]  . sertraline (ZOLOFT) 50 MG tablet    Sig: Take 1 tablet (50 mg total) by mouth daily.    Dispense:  90 tablet    Refill:  0    Order Specific Question:   Supervising Provider    Answer:   MATTHEWS, CODY [[id number]]  . traZODone (DESYREL) 50 MG tablet    Sig: Take 1 tablet (50 mg total) by mouth at bedtime as needed for sleep.    Dispense:  30 tablet    Refill:  1    Order Specific Question:   Supervising Provider    Answer:   MATTHEWS, CODY [[id number]]  . divalproex (DEPAKOTE) 500 MG DR tablet    Sig: Take 1 tablet (500 mg total) by mouth at bedtime.    Dispense:  90 tablet    Refill:  0    Order Specific Question:   Supervising Provider    Answer:   MATTHEWS, CODY [[id number]]    Problem List Items Addressed This Visit      Other   ANXIETY DEPRESSION   Relevant Medications   sertraline (ZOLOFT) 50 MG tablet   traZODone (DESYREL) 50 MG tablet   Other Relevant  Orders   Ambulatory referral to Psychiatry   Bipolar affective disorder, current episode manic with psychotic symptoms (HCC)   Relevant Medications   divalproex (DEPAKOTE) 250 MG DR tablet   sertraline (ZOLOFT) 50 MG tablet   traZODone (DESYREL) 50 MG tablet   divalproex (DEPAKOTE) 500 MG DR tablet   Other Relevant Orders   Ambulatory referral to Psychiatry   Basic metabolic panel   Polysubstance dependence including opioid type drug, episodic abuse (HCC)   Relevant Orders   POCT urine pregnancy (Completed)   Ambulatory referral to Psychiatry   Pain Mgmt, Profile 8 w/Conf, U   PTSD (post-traumatic stress disorder)   Relevant Medications   sertraline (ZOLOFT) 50 MG tablet   traZODone (DESYREL) 50 MG tablet   Substance induced mood disorder (HCC)    Other Visit Diagnoses    Hypertension, unspecified type    -  Primary   Relevant Medications   amLODipine (NORVASC) 5 MG tablet   Other Relevant Orders   Basic metabolic panel       Follow-up: Return in about 1 week (around 01/20/2019) for HTN ( ).  Alysia Penna, NP

## 2019-01-13 NOTE — Patient Instructions (Signed)
Will send medication after review of lab results.  Go to lab for blood draw and urine collection.

## 2019-01-16 ENCOUNTER — Other Ambulatory Visit: Payer: Medicare Other

## 2019-01-16 ENCOUNTER — Encounter: Payer: Self-pay | Admitting: Nurse Practitioner

## 2019-01-16 DIAGNOSIS — F312 Bipolar disorder, current episode manic severe with psychotic features: Secondary | ICD-10-CM

## 2019-01-16 DIAGNOSIS — I10 Essential (primary) hypertension: Secondary | ICD-10-CM

## 2019-01-19 LAB — BASIC METABOLIC PANEL

## 2019-01-19 LAB — PAIN MGMT, PROFILE 8 W/CONF, U
6 Acetylmorphine: 42 ng/mL — ABNORMAL HIGH (ref <10)
6 Acetylmorphine: POSITIVE ng/mL — AB (ref <10)
Alcohol Metabolites: NEGATIVE ng/mL (ref <500)
Amphetamine: NEGATIVE ng/mL (ref <250)
Amphetamines: POSITIVE ng/mL — AB (ref <500)
BUPRENORPHINE, URINE: NEGATIVE ng/mL (ref <5)
Benzodiazepines: NEGATIVE ng/mL (ref <100)
Benzoylecgonine: 737 ng/mL — ABNORMAL HIGH (ref <100)
Cocaine Metabolite: POSITIVE ng/mL — AB (ref <150)
Codeine: 1030 ng/mL — ABNORMAL HIGH (ref <50)
Creatinine: 227 mg/dL
HYDROCODONE: NEGATIVE ng/mL (ref <50)
Hydromorphone: NEGATIVE ng/mL (ref <50)
MDMA: NEGATIVE ng/mL (ref <500)
Marijuana Metabolite: NEGATIVE ng/mL (ref <20)
Methamphetamine: 6891 ng/mL — ABNORMAL HIGH (ref <250)
Morphine: 19312 ng/mL — ABNORMAL HIGH (ref <50)
Norhydrocodone: NEGATIVE ng/mL (ref <50)
Opiates: POSITIVE ng/mL — AB (ref <100)
Oxidant: NEGATIVE ug/mL (ref <200)
Oxycodone: NEGATIVE ng/mL (ref <100)
pH: 7.41 (ref 4.5–9.0)

## 2019-01-20 ENCOUNTER — Ambulatory Visit: Payer: Self-pay | Admitting: Nurse Practitioner

## 2019-02-04 ENCOUNTER — Other Ambulatory Visit: Payer: Self-pay | Admitting: Nurse Practitioner

## 2019-02-04 DIAGNOSIS — I10 Essential (primary) hypertension: Secondary | ICD-10-CM

## 2019-03-16 ENCOUNTER — Other Ambulatory Visit: Payer: Self-pay | Admitting: Nurse Practitioner

## 2019-03-16 DIAGNOSIS — F341 Dysthymic disorder: Secondary | ICD-10-CM

## 2019-03-16 DIAGNOSIS — F312 Bipolar disorder, current episode manic severe with psychotic features: Secondary | ICD-10-CM

## 2019-03-16 NOTE — Telephone Encounter (Signed)
Left vm for the pt to call back.  

## 2019-03-20 NOTE — Telephone Encounter (Signed)
Left vm for the pt to call back need to make an appt with PCP.

## 2019-03-20 NOTE — Telephone Encounter (Signed)
[name] please advise, last ov was 0207/2020 and pt was suppose to come back in 1 wk for BP follow up. Do you want to see her for this or refill can be send? Please advise.

## 2019-03-20 NOTE — Telephone Encounter (Signed)
yes

## 2019-04-07 ENCOUNTER — Other Ambulatory Visit: Payer: Self-pay | Admitting: Nurse Practitioner

## 2019-04-07 ENCOUNTER — Telehealth: Payer: Self-pay | Admitting: Nurse Practitioner

## 2019-04-07 DIAGNOSIS — F312 Bipolar disorder, current episode manic severe with psychotic features: Secondary | ICD-10-CM

## 2019-04-07 DIAGNOSIS — F341 Dysthymic disorder: Secondary | ICD-10-CM

## 2019-04-07 NOTE — Telephone Encounter (Signed)
Please call and schedule video appt today

## 2019-04-07 NOTE — Telephone Encounter (Signed)
Called and talked with patient father (on [place].) He states that the patient does not want to schedule a virtual visit with [place] at this time.

## 2019-04-07 NOTE — Telephone Encounter (Signed)
Pt will call back

## 2019-04-10 NOTE — Telephone Encounter (Signed)
Left another vm for the pt to call back.  

## 2019-06-26 DIAGNOSIS — F5101 Primary insomnia: Secondary | ICD-10-CM | POA: Insufficient documentation

## 2019-06-26 DIAGNOSIS — F909 Attention-deficit hyperactivity disorder, unspecified type: Secondary | ICD-10-CM | POA: Insufficient documentation

## 2019-06-26 DIAGNOSIS — B1920 Unspecified viral hepatitis C without hepatic coma: Secondary | ICD-10-CM | POA: Insufficient documentation

## 2020-06-24 ENCOUNTER — Encounter (HOSPITAL_COMMUNITY): Payer: Self-pay

## 2020-06-24 ENCOUNTER — Other Ambulatory Visit: Payer: Self-pay

## 2020-06-24 ENCOUNTER — Ambulatory Visit (HOSPITAL_COMMUNITY)
Admission: EM | Admit: 2020-06-24 | Discharge: 2020-06-24 | Disposition: A | Discharge status: Home / Self Care | Payer: Medicare Other | Attending: Family Medicine | Admitting: Family Medicine

## 2020-06-24 DIAGNOSIS — N390 Urinary tract infection, site not specified: Secondary | ICD-10-CM | POA: Insufficient documentation

## 2020-06-24 DIAGNOSIS — Z3202 Encounter for pregnancy test, result negative: Secondary | ICD-10-CM | POA: Diagnosis not present

## 2020-06-24 LAB — POCT URINALYSIS DIP (DEVICE)
Glucose, UA: NEGATIVE mg/dL
Nitrite: NEGATIVE
Protein, ur: 100 mg/dL — AB
Specific Gravity, Urine: >=1.03 (ref 1.005–1.030)
Urobilinogen, UA: 0.2 mg/dL (ref 0.0–1.0)
pH: 5.5 (ref 5.0–8.0)

## 2020-06-24 LAB — POC URINE PREG, ED: Preg Test, Ur: NEGATIVE

## 2020-06-24 MED ORDER — NITROFURANTOIN MONOHYD MACRO 100 MG PO CAPS: 100.0000 mg | ORAL_CAPSULE | Freq: Two times a day (BID) | ORAL | 0 refills | Status: AC

## 2020-06-24 NOTE — ED Provider Notes (Signed)
[facility]    CSN: [id number] Arrival date & time: 06/24/20  7939      History   Chief Complaint Chief Complaint  Patient presents with   Urinary Tract Infection    HPI [name] is a 39 y.o. female.   [name] presents with complaints of pain with urination which has been worsening over the past 1.5 weeks. No frequency. No abdominal, pelvic or back pain. No fevers. Denies any vaginal symptoms. LMP >1 month ago, which can be normal for her. Sexually active, uses condoms. Has history of UTI's, states typically gets around 3-4/ year. Has not had to follow with urology in the past. Denies any known antibiotic resistance.    ROS per HPI, negative if not otherwise mentioned.      Past Medical History:  Diagnosis Date   ADHD (attention deficit hyperactivity disorder)    Alcohol abuse    Anxiety    Bipolar 1 disorder (HCC)    Borderline systolic HTN    Depression    GERD (gastroesophageal reflux disease)    Heroin abuse (HCC)    Hypertension    Kidney stone    Knee pain, bilateral    - patellofemoral syndrome, followed by Dr. [name] at [facility]   Migraines    Migraines    Polysubstance abuse (HCC)    - Hx ETOH, cocaine, THC, IV heroin, narcotics   Psychiatric care    Previous BH H&P notes PMHx Schizophrenia, though patient denies this.   Seizures [facility])     Patient Active Problem List   Diagnosis Date Noted   PTSD (post-traumatic stress disorder) 09/14/2018   Bipolar affective disorder, current episode manic with psychotic symptoms (HCC) 09/13/2018   Tardive dyskinesia 01/07/2018   Polysubstance dependence including opioid type drug, episodic abuse (HCC) 06/27/2017   Substance induced mood disorder (HCC) 06/27/2017   Anxiety    Herpes 09/21/2016   Dehydration 08/26/2013   Right knee pain 06/27/2013   Carpal tunnel syndrome, bilateral 04/21/2011   ANXIETY DEPRESSION 08/20/2010   SUBSTANCE ABUSE, MULTIPLE  08/20/2010   ADHD 08/20/2010   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      Home Medications    Prior to Admission medications   Medication Sig Start Date End Date Taking? Authorizing Provider  metoprolol succinate (TOPROL-XL) 25 MG 24 hr tablet Take 25 mg by mouth daily.   Yes [provider]  amLODipine (NORVASC) 5 MG tablet Take 1 tablet (5 mg total) by mouth daily. Need office visit for additional refills 02/06/19   Nche, Bonna Gains, NP  divalproex (DEPAKOTE) 250 MG DR tablet Take 1 tablet (250 mg total) by mouth every morning. 01/13/19   Nche, Bonna Gains, NP  divalproex (DEPAKOTE) 500 MG DR tablet Take 1 tablet (500 mg total) by mouth at bedtime. 01/13/19   Nche, Bonna Gains, NP  famotidine (PEPCID) 40 MG tablet Take 40 mg by mouth at bedtime. 06/08/20   [provider]  hydrOXYzine (ATARAX/VISTARIL) 50 MG tablet Take 1 tablet (50 mg total) by mouth every 6 (six) hours as needed for anxiety. 09/19/18   Micheal Likens, MD  methadone (DOLOPHINE) 10 MG/ML solution Take 90 mg by mouth daily.     [provider]  nitrofurantoin, macrocrystal-monohydrate, (MACROBID) 100 MG capsule Take 1 capsule (100 mg total) by mouth 2 (two) times daily for 5 days. 06/24/20 06/29/20  Georgetta Haber, NP  sertraline (ZOLOFT) 50 MG tablet Take 1 tablet (50 mg total) by mouth daily. 01/13/19   Nche, Bonna Gains, NP  SUMAtriptan (IMITREX) 100 MG tablet Take 100 mg by mouth every 2 (two) hours as needed for migraine. May repeat in 2 hours if headache persists or recurs.    [provider]  traZODone (DESYREL) 50 MG tablet Take 1 tablet (50 mg total) by mouth at bedtime as needed for sleep. 01/13/19   Nche, Bonna Gains, NP    Family History Family  History  Problem Relation Age of Onset   Hypertension Father     Social History Social History   Tobacco Use   Smoking status: Former Smoker    Packs/day: 0.50    Types: Cigarettes   Smokeless tobacco: Never Used  Building services engineer Use: Never used  Substance Use Topics   Alcohol use: No   Drug use: No    Types: IV    Comment: none for 8 days     Allergies   Shellfish allergy   Review of Systems Review of Systems   Physical Exam Triage Vital Signs ED Triage Vitals [06/24/20 0906]  Enc Vitals Group     BP 136/85     Pulse Rate 68     Resp 16     Temp 99 F (37.2 C)     Temp Source Oral     SpO2 96 %     Weight 220 lb (99.8 kg)     Height 5\' 7"  (1.702 m)     Head Circumference      Peak Flow      Pain Score 0     Pain Loc      Pain Edu?      Excl. in GC?    No data found.  Updated Vital Signs BP 136/85    Pulse 68    Temp 99 F (37.2 C) (Oral)    Resp 16    Ht 5\' 7"  (1.702 m)    Wt 220 lb (99.8 kg)    SpO2 96%    BMI 34.46 kg/m   Visual Acuity Right Eye Distance:   Left Eye Distance:   Bilateral Distance:    Right Eye Near:   Left Eye Near:    Bilateral Near:     Physical Exam Constitutional:      General: She is not in acute distress.    Appearance: She is well-developed.  Cardiovascular:     Rate and Rhythm: Normal rate.  Pulmonary:     Effort: Pulmonary effort is normal.  Abdominal:     Tenderness: There is no abdominal tenderness. There is no right CVA tenderness or left CVA tenderness.  Skin:    General: Skin is warm and dry.  Neurological:     Mental Status: She is alert and oriented to person, place, and time.      UC Treatments / Results  Labs (all labs ordered are listed, but only abnormal results are displayed) Labs Reviewed  POCT URINALYSIS DIP (DEVICE) - Abnormal; Notable for the following components:      Result Value   Bilirubin Urine SMALL (*)    Ketones, ur TRACE (*)    Hgb urine dipstick MODERATE (*)     Protein, ur 100 (*)    Leukocytes,Ua  LARGE (*)    All other components within normal limits  POC URINE PREG, ED    EKG   Radiology No results found.  Procedures Procedures (including critical care time)  Medications Ordered in UC Medications - No data to display  Initial Impression / Assessment and Plan / UC Course  I have reviewed the triage vital signs and the nursing notes.  Pertinent labs & imaging results that were available during my care of the patient were reviewed by me and considered in my medical decision making (see chart for details).     Large leuks and moderate hgb with dysuria. macrobid initiated pending urine culture. Follow up recommended and return precautions provided. Patient verbalized understanding and agreeable to plan.   Final Clinical Impressions(s) / UC Diagnoses   Final diagnoses:  Lower urinary tract infectious disease     Discharge Instructions     Drink plenty of water to empty bladder regularly. Avoid alcohol and caffeine as these may irritate the bladder.   Complete course of antibiotics.  If symptoms worsen or do not improve in the next week to return to be seen or to follow up with PCP.     ED Prescriptions    Medication Sig Dispense Auth. Provider   nitrofurantoin, macrocrystal-monohydrate, (MACROBID) 100 MG capsule Take 1 capsule (100 mg total) by mouth 2 (two) times daily for 5 days. 10 capsule Georgetta Haber, NP     PDMP not reviewed this encounter.   Georgetta Haber, NP 06/24/20 1027

## 2020-06-24 NOTE — Discharge Instructions (Signed)
Drink plenty of water to empty bladder regularly. Avoid alcohol and caffeine as these may irritate the bladder.   Complete course of antibiotics.  If symptoms worsen or do not improve in the next week to return to be seen or to follow up with PCP.   

## 2020-06-24 NOTE — ED Triage Notes (Signed)
Pt c/o dysuria and frequencyx1.5 wks

## 2020-06-25 LAB — URINE CULTURE

## 2020-07-26 ENCOUNTER — Ambulatory Visit (HOSPITAL_COMMUNITY)
Admission: EM | Admit: 2020-07-26 | Discharge: 2020-07-26 | Disposition: A | Discharge status: Home / Self Care | Payer: Medicare Other

## 2020-07-26 ENCOUNTER — Other Ambulatory Visit: Payer: Self-pay

## 2020-07-26 NOTE — Care Management (Signed)
Patient wants to receive services with a psychiatrist and a therapist on an outpatient basis.  Patient was provided a list of outpatient resource that accept Medicaid and a city bus transit one ride bus ticket  Patient denies SI/HI/Psychosis/.  Patient denies MSE Exam.  Patient reports that she receives services for substance abuse (Methadone) at [facility]

## 2020-07-27 ENCOUNTER — Emergency Department (HOSPITAL_COMMUNITY)
Admission: EM | Admit: 2020-07-27 | Discharge: 2020-07-29 | Disposition: A | Discharge status: Other Acute Inpatient Hospital | Payer: Medicare Other | Attending: Emergency Medicine | Admitting: Emergency Medicine

## 2020-07-27 ENCOUNTER — Other Ambulatory Visit: Payer: Self-pay

## 2020-07-27 ENCOUNTER — Emergency Department (HOSPITAL_COMMUNITY): Payer: Medicare Other

## 2020-07-27 DIAGNOSIS — I10 Essential (primary) hypertension: Secondary | ICD-10-CM | POA: Insufficient documentation

## 2020-07-27 DIAGNOSIS — F419 Anxiety disorder, unspecified: Secondary | ICD-10-CM | POA: Insufficient documentation

## 2020-07-27 DIAGNOSIS — Z79899 Other long term (current) drug therapy: Secondary | ICD-10-CM | POA: Insufficient documentation

## 2020-07-27 DIAGNOSIS — Z008 Encounter for other general examination: Secondary | ICD-10-CM | POA: Diagnosis present

## 2020-07-27 DIAGNOSIS — Z20822 Contact with and (suspected) exposure to covid-19: Secondary | ICD-10-CM | POA: Diagnosis not present

## 2020-07-27 DIAGNOSIS — F312 Bipolar disorder, current episode manic severe with psychotic features: Secondary | ICD-10-CM | POA: Insufficient documentation

## 2020-07-27 DIAGNOSIS — Z87891 Personal history of nicotine dependence: Secondary | ICD-10-CM | POA: Diagnosis not present

## 2020-07-27 LAB — COMPREHENSIVE METABOLIC PANEL
ALT: 51 U/L — ABNORMAL HIGH (ref 0–44)
AST: 48 U/L — ABNORMAL HIGH (ref 15–41)
Albumin: 4.2 g/dL (ref 3.5–5.0)
Alkaline Phosphatase: 66 U/L (ref 38–126)
Anion gap: 16 — ABNORMAL HIGH (ref 5–15)
BUN: 20 mg/dL (ref 6–20)
CO2: 21 mmol/L — ABNORMAL LOW (ref 22–32)
Calcium: 9.7 mg/dL (ref 8.9–10.3)
Chloride: 104 mmol/L (ref 98–111)
Creatinine, Ser: 0.65 mg/dL (ref 0.44–1.00)
GFR calc Af Amer: >60 mL/min (ref >60)
GFR calc non Af Amer: >60 mL/min (ref >60)
Glucose, Bld: 119 mg/dL — ABNORMAL HIGH (ref 70–99)
Potassium: 4.1 mmol/L (ref 3.5–5.1)
Sodium: 141 mmol/L (ref 135–145)
Total Bilirubin: 0.7 mg/dL (ref 0.3–1.2)
Total Protein: 8.1 g/dL (ref 6.5–8.1)

## 2020-07-27 LAB — CBC WITH DIFFERENTIAL/PLATELET
Abs Immature Granulocytes: 0.11 10*3/uL — ABNORMAL HIGH (ref 0.00–0.07)
Basophils Absolute: 0.1 10*3/uL (ref 0.0–0.1)
Basophils Relative: 0 %
Eosinophils Absolute: 0 10*3/uL (ref 0.0–0.5)
Eosinophils Relative: 0 %
HCT: 41.7 % (ref 36.0–46.0)
Hemoglobin: 13.4 g/dL (ref 12.0–15.0)
Immature Granulocytes: 1 %
Lymphocytes Relative: 16 %
Lymphs Abs: 1.9 10*3/uL (ref 0.7–4.0)
MCH: 26.9 pg (ref 26.0–34.0)
MCHC: 32.1 g/dL (ref 30.0–36.0)
MCV: 83.6 fL (ref 80.0–100.0)
Monocytes Absolute: 0.6 10*3/uL (ref 0.1–1.0)
Monocytes Relative: 5 %
Neutro Abs: 8.9 10*3/uL — ABNORMAL HIGH (ref 1.7–7.7)
Neutrophils Relative %: 78 %
Platelets: 360 10*3/uL (ref 150–400)
RBC: 4.99 MIL/uL (ref 3.87–5.11)
RDW: 13.8 % (ref 11.5–15.5)
WBC: 11.6 10*3/uL — ABNORMAL HIGH (ref 4.0–10.5)
nRBC: 0 % (ref 0.0–0.2)

## 2020-07-27 LAB — SARS CORONAVIRUS 2 BY RT PCR (HOSPITAL ORDER, PERFORMED IN ~~LOC~~ HOSPITAL LAB): SARS Coronavirus 2: NEGATIVE

## 2020-07-27 LAB — I-STAT BETA HCG BLOOD, ED (MC, WL, AP ONLY): I-stat hCG, quantitative: <5 m[IU]/mL (ref <5)

## 2020-07-27 LAB — ACETAMINOPHEN LEVEL: Acetaminophen (Tylenol), Serum: 12 ug/mL (ref 10–30)

## 2020-07-27 LAB — RAPID URINE DRUG SCREEN, HOSP PERFORMED
Amphetamines: NOT DETECTED
Barbiturates: NOT DETECTED
Benzodiazepines: POSITIVE — AB
Cocaine: NOT DETECTED
Opiates: NOT DETECTED
Tetrahydrocannabinol: NOT DETECTED

## 2020-07-27 LAB — SALICYLATE LEVEL: Salicylate Lvl: <7 mg/dL — ABNORMAL LOW (ref 7.0–30.0)

## 2020-07-27 LAB — ETHANOL: Alcohol, Ethyl (B): <10 mg/dL (ref <10)

## 2020-07-27 IMAGING — CR DG CHEST 2V
2 series · 2 of 2 positions shown · non-contrast
Comparison: 08/23/2012

CLINICAL DATA: Intermittent chest pain.

EXAM:
CHEST - 2 VIEW

[w chest pa]
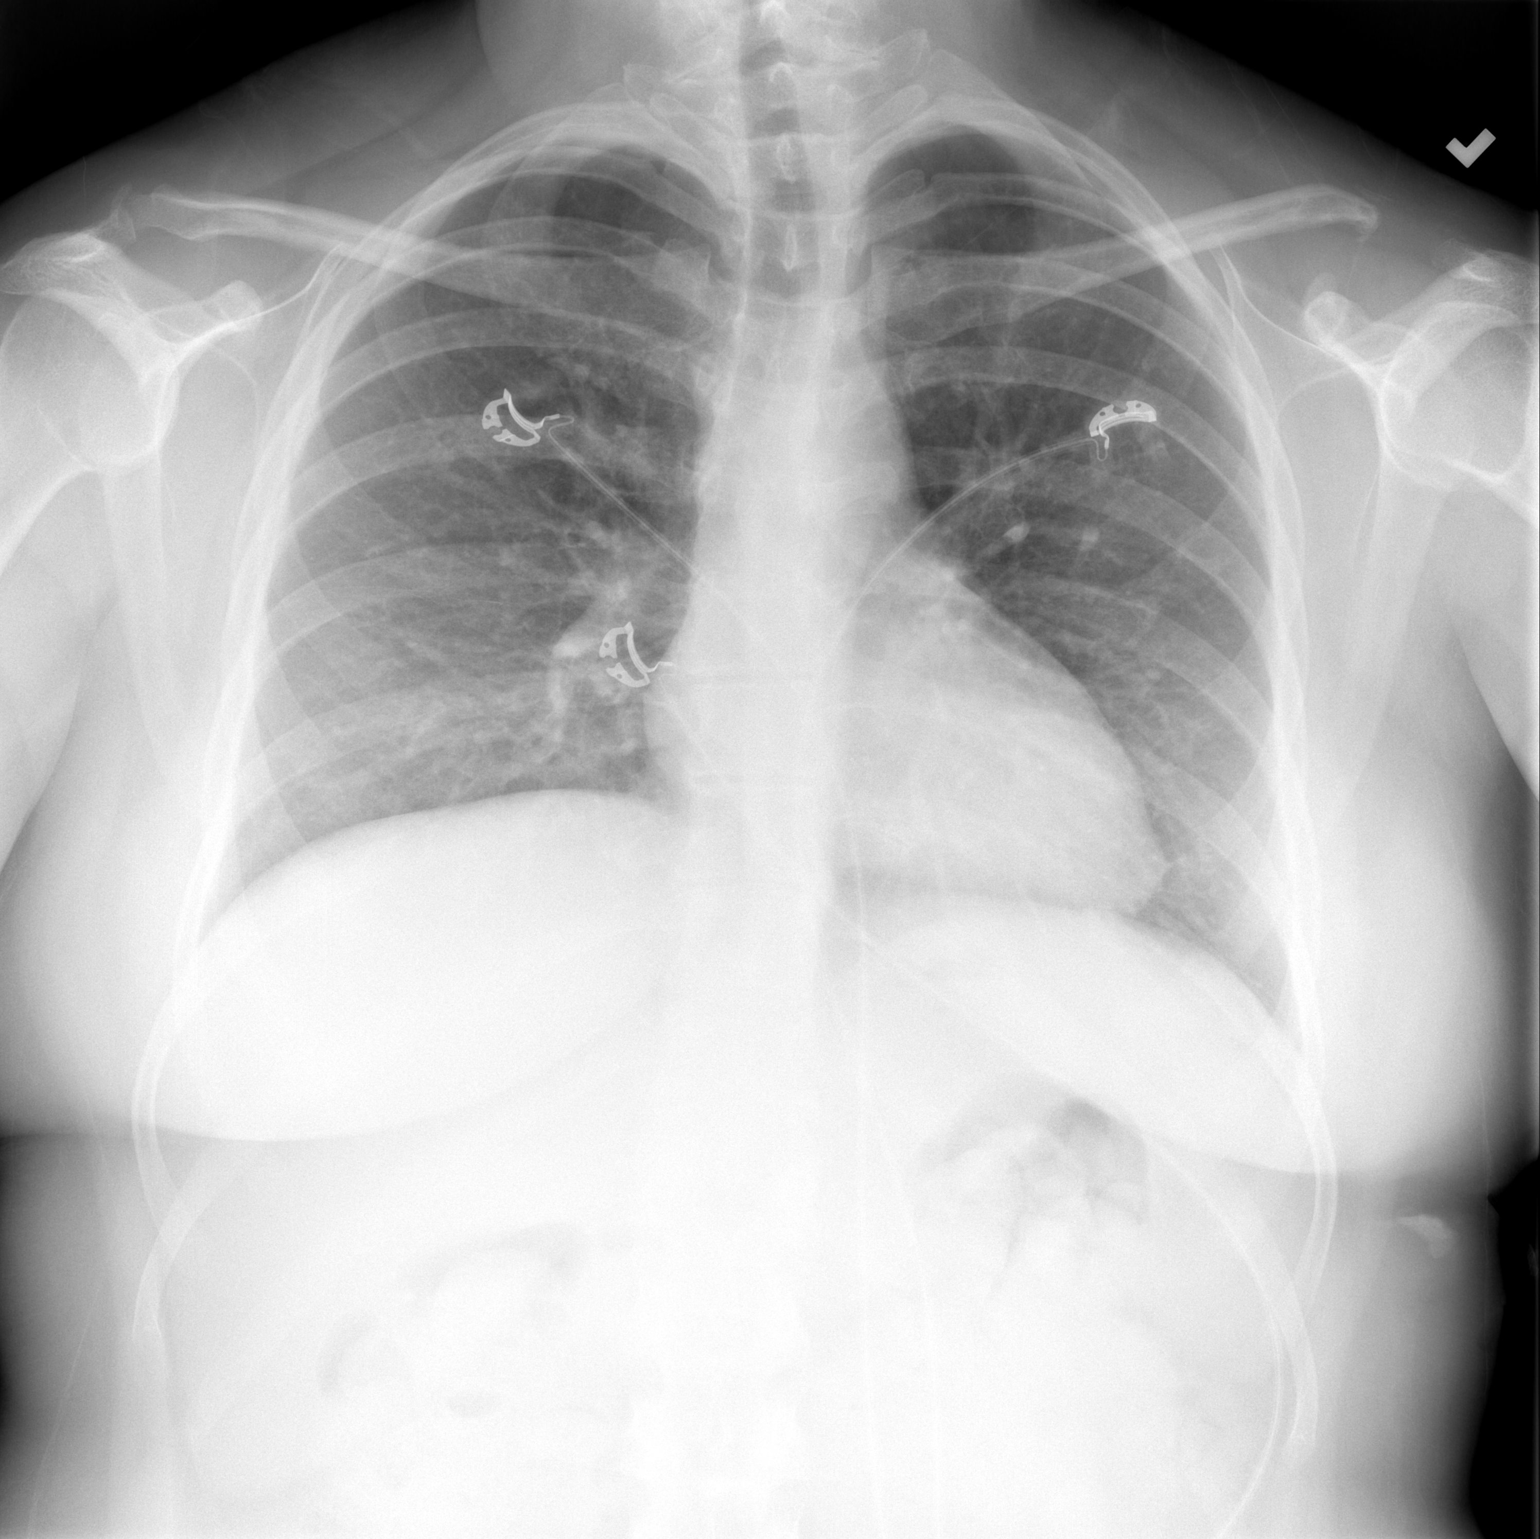

[w chest lat]
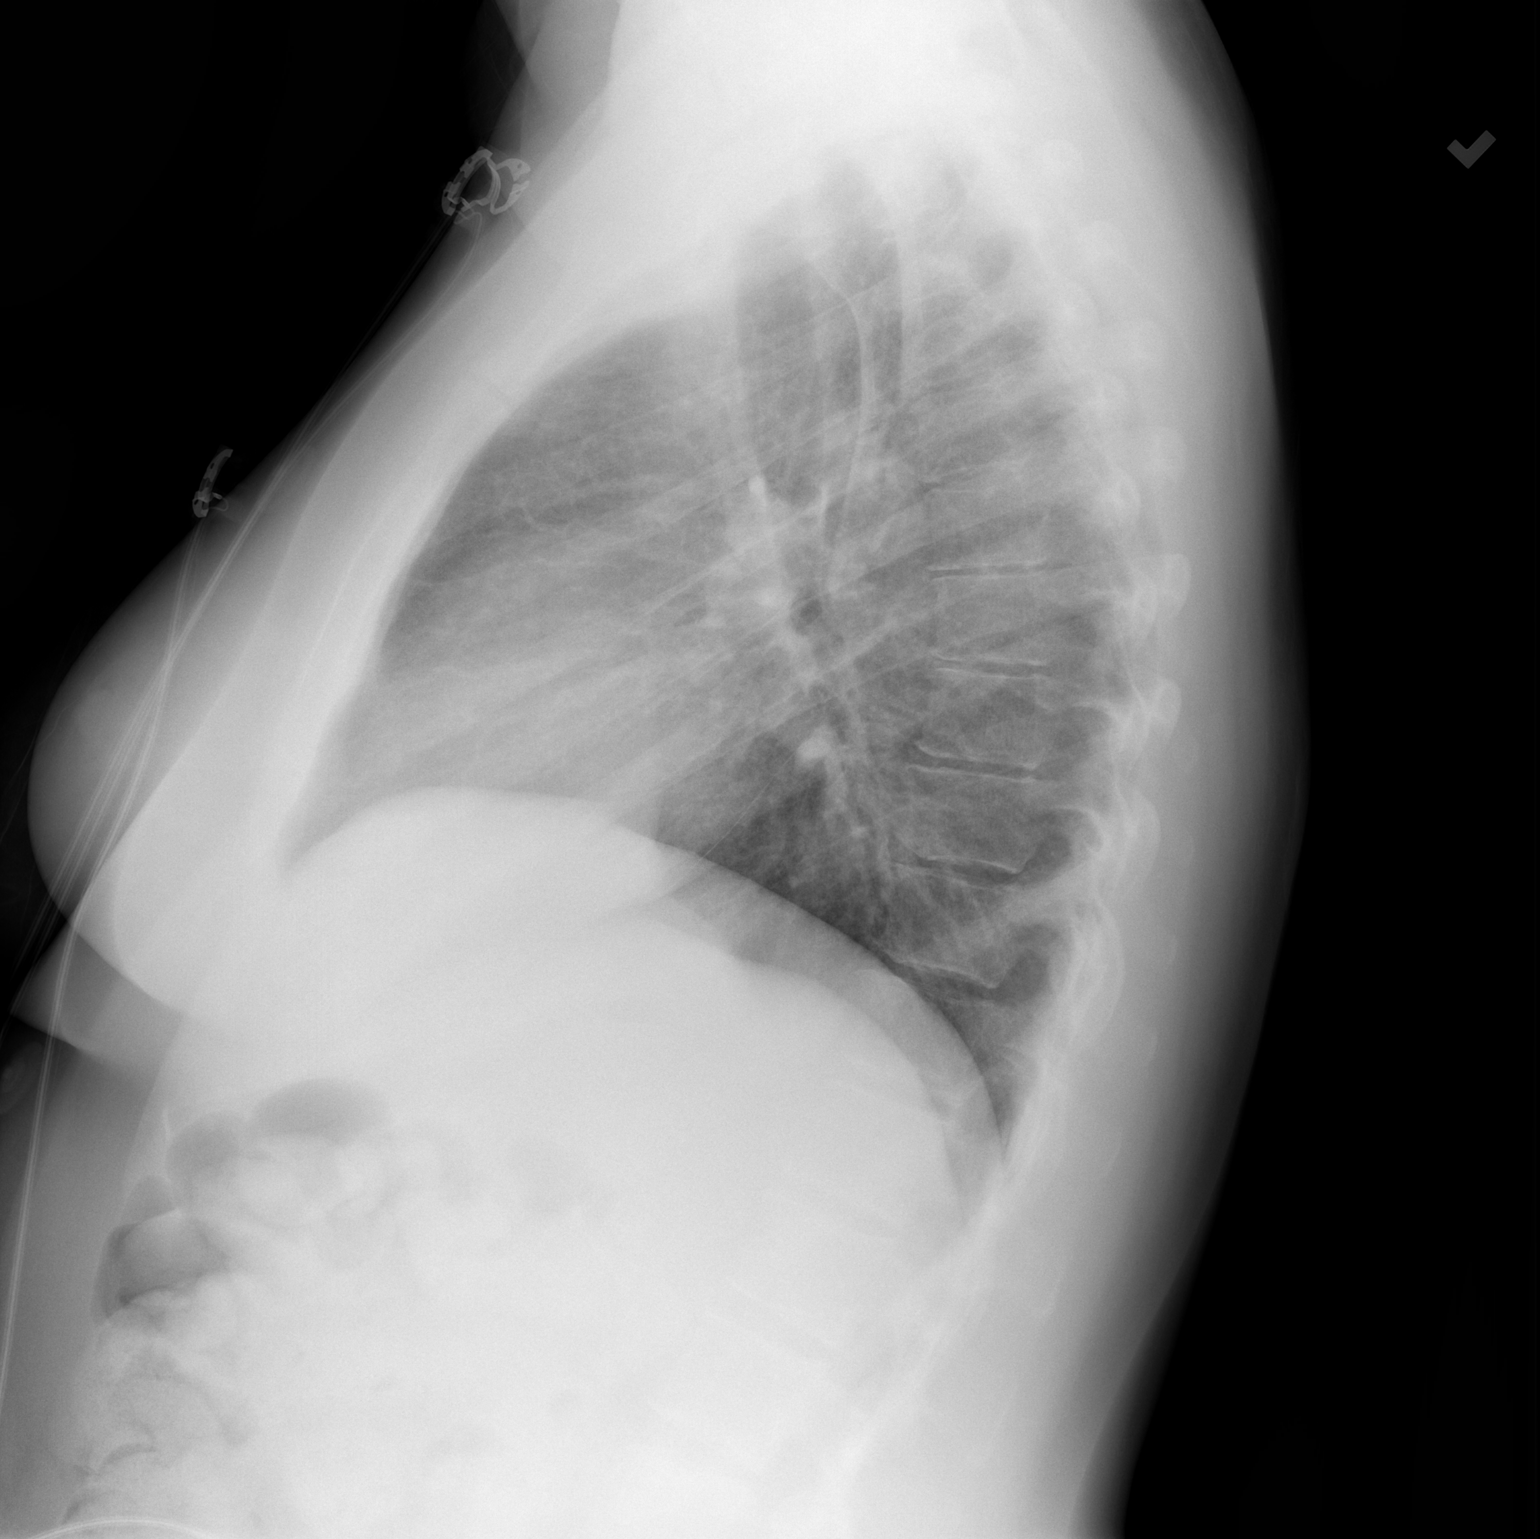

[2 of 2 positions shown; findings below may reference images not displayed]

FINDINGS: Cardiomediastinal silhouette is normal. Mediastinal contours appear
intact.

There is no evidence of focal airspace consolidation, pleural
effusion or pneumothorax.

Osseous structures are without acute abnormality. Soft tissues are
grossly normal.
IMPRESSION: No active cardiopulmonary disease.

## 2020-07-27 MED ORDER — PANTOPRAZOLE SODIUM 40 MG PO TBEC
40.0000 mg | DELAYED_RELEASE_TABLET | Freq: Every day | ORAL | Status: DC
  Administered 2020-07-27 – 2020-07-29 (×3): 40 mg via ORAL
  Filled 2020-07-27 (×3): qty 1

## 2020-07-27 MED ORDER — LORAZEPAM 2 MG/ML IJ SOLN: 1.0000 mg | Freq: Once | INTRAMUSCULAR | Status: DC

## 2020-07-27 MED ORDER — METHADONE HCL 10 MG/ML PO CONC
100.0000 mg | Freq: Every day | ORAL | Status: DC
  Administered 2020-07-27 – 2020-07-29 (×3): 100 mg via ORAL
  Filled 2020-07-27 (×4): qty 10

## 2020-07-27 MED ORDER — HYDROXYZINE HCL 25 MG PO TABS
25.0000 mg | ORAL_TABLET | Freq: Four times a day (QID) | ORAL | Status: DC | PRN
  Administered 2020-07-29: 25 mg via ORAL
  Filled 2020-07-27: qty 1

## 2020-07-27 MED ORDER — SODIUM CHLORIDE 0.9 % IV BOLUS: 500.0000 mL | Freq: Once | INTRAVENOUS | Status: DC

## 2020-07-27 MED ORDER — METOPROLOL TARTRATE 25 MG PO TABS
25.0000 mg | ORAL_TABLET | Freq: Two times a day (BID) | ORAL | Status: DC
  Administered 2020-07-27 – 2020-07-29 (×4): 25 mg via ORAL
  Filled 2020-07-27 (×4): qty 1

## 2020-07-27 MED ORDER — TRAZODONE HCL 50 MG PO TABS
50.0000 mg | ORAL_TABLET | Freq: Every evening | ORAL | Status: DC | PRN
  Administered 2020-07-28: 50 mg via ORAL
  Filled 2020-07-27: qty 1

## 2020-07-27 NOTE — BH Assessment (Signed)
Case was staffed with Arvilla Market NP who recommended patient be observed and monitored.

## 2020-07-27 NOTE — ED Notes (Signed)
PT HAS 1 PLASTIC BELONGING BAG IN LOCKER 31

## 2020-07-27 NOTE — BH Assessment (Addendum)
Comprehensive Clinical Assessment (CCA) Screening, Triage and Referral Note  07/27/2020 [name] [id number] Patient presents this date voluntary with altered mental state. Patient is brought in by her father Elizibeth Breau [phone] who provides history. Patient is not oriented and memory is recent impaired. Patient provides limited history stating she "isn't right." Patient's father states patient currently resides with him and she has been in a methadone program at [facility] for the last two years where she receives 100 mg of methadone daily. Father states patient was not given that medication this date due to patient presenting with altered state. Patient is very tangential and difficult to redirect. Patient does deny any S/I or H/I. Patient does report active AVH although does not elaborate on content. This Clinical research associate is uncertain if patient is processing the content of this writer's questions at times. Patient is denying any ingestion of any substances and states she has been maintaining her sobriety for two years since she has entered into her harm reduction program. Patient is vague on her history in reference to SA issues prior to that program stating "she just used drugs." Patient was last seen per chart review on on 09/08/18 presenting with similar symptoms. Patient at that time was [facility] where she was in the harm reduction program to include methadone maintenance and intensive outpatient. Patient per that note had a extensive SA history to include prior use of Heroin, Amphetamines, Cocaine and Cannabis. Patient's UDS is pending this date. It is unclear this date what medications patient is currently receiving for mental health issues.   Per admission note this date: Patient sent in today from methadone clinic for manic behavior. Patient reports of the last 2 days she has had increased anxiety, "delusions" and racing thoughts. She is uncertain why  this has been occurring she reports that she is very worried about her family and their wellbeing as well as about herself. She is very tangential when she speaks and easily distracted by RNs walking around in the hallway. She apologizes frequently during our conversation as she is aware she is having a hard time of keeping track of our discussion.  Patient is not oriented and is tangential. Patient's memory is impaired and thoughts disorganized. It is unclear if patient is currently responding to internal stimuli. Case was staffed with Arvilla Market NP who recommended patient be observed and monitored.   Visit Diagnosis: Altered mental state    ICD-10-CM   1. Anxiety  F41.9   2. Medical clearance for psychiatric admission  Z00.8     Patient Reported Information How did you hear about [place]? Self   Referral name: No data recorded  Referral phone number: No data recorded Whom do you see for routine medical problems? I don't have a doctor   Practice/Facility Name: No data recorded  Practice/Facility Phone Number: No data recorded  Name of Contact: No data recorded  Contact Number: No data recorded  Contact Fax Number: No data recorded  Prescriber Name: No data recorded  Prescriber Address (if known): No data recorded What Is the Reason for Your Visit/Call Today? Ongoing mania  How Long Has This Been Causing You Problems? <Week  Have You Recently Been in Any Inpatient Treatment (Hospital/Detox/Crisis Center/28-Day Program)? No   Name/Location of Program/Hospital:No data recorded  How Long Were You There? No data recorded  When Were You Discharged? No data recorded Have You Ever Received Services From [facility] Before? No   Who Do You  See at [facility]? No data recorded Have You Recently Had Any Thoughts About Hurting Yourself? No   Are You Planning to Commit Suicide/Harm Yourself At This time?  No  Have you Recently Had Thoughts About Hurting Someone Karolee Ohs? No   Explanation: No data  recorded Have You Used Any Alcohol or Drugs in the Past 24 Hours? No   How Long Ago Did You Use Drugs or Alcohol?  No data recorded  What Did You Use and How Much? No data recorded What Do You Feel Would Help You the Most Today? Therapy;Medication  Do You Currently Have a Therapist/Psychiatrist? No   Name of Therapist/Psychiatrist: No data recorded  Have You Been Recently Discharged From Any Office Practice or Programs? No   Explanation of Discharge From Practice/Program:  No data recorded    CCA Screening Triage Referral Assessment Type of Contact: Face-to-Face   Is this Initial or Reassessment? No data recorded  Date Telepsych consult ordered in [facility]:  No data recorded  Time Telepsych consult ordered in [facility]:  No data recorded Patient Reported Information Reviewed? Yes   Patient Left Without Being Seen? No   Reason for Not Completing Assessment: No data recorded Collateral Involvement: No data recorded Does Patient Have a Court Appointed Legal Guardian? No data recorded  Name and Contact of Legal Guardian:  No data recorded If Minor and Not Living with Parent(s), Who has Custody? No data recorded Is CPS involved or ever been involved? Never  Is APS involved or ever been involved? Never  Patient Determined To Be At Risk for Harm To Self or Others Based on Review of Patient Reported Information or Presenting Complaint? No   Method: No data recorded  Availability of Means: No data recorded  Intent: No data recorded  Notification Required: No data recorded  Additional Information for Danger to Others Potential:  No data recorded  Additional Comments for Danger to Others Potential:  No data recorded  Are There Guns or Other Weapons in Your Home?  No data recorded   Types of Guns/Weapons: No data recorded   Are These Weapons Safely Secured?                              No data recorded   Who Could Verify You Are Able To Have These Secured:    No data recorded Do You Have any  Outstanding Charges, Pending Court Dates, Parole/Probation? No data recorded Contacted To Inform of Risk of Harm To Self or Others: No data recorded Location of Assessment: [facility] ED  Does Patient Present under Involuntary Commitment? No   IVC Papers Initial File Date: No data recorded  [place] of Residence: [place]  Patient Currently Receiving the Following Services: Medication Management   Determination of Need: No data recorded  Options For Referral: Outpatient Therapy   Alfredia Ferguson, LCAS

## 2020-07-27 NOTE — BH Assessment (Signed)
This Clinical research associate contacted [facility] after hours number [phone] and confirmed patient's methadone dose to be 100 mg. Patient was not dosed this date on 07/27/20.

## 2020-07-27 NOTE — ED Notes (Signed)
Fluid bolus is finished.

## 2020-07-27 NOTE — ED Triage Notes (Signed)
Patient here after going to methadone clinic this morning and they suggested she needed psychiatric attention.  She is very anxious and somewhat manic.  Unable to remember if she is taking her medication regularly.

## 2020-07-27 NOTE — ED Provider Notes (Signed)
[facility]-EMERGENCY DEPT Provider Note   CSN: [id number] Arrival date & time: 07/27/20  1022     History Chief Complaint  Patient presents with  . Medical Clearance    Anxiety/manic    [name] is a 39 y.o. female history of polysubstance abuse, hypertension, migraines, bipolar, GERD, seizures.  Patient sent in today from methadone clinic for manic behavior.  Patient reports of the last 2 days she has had increased anxiety, "delusions" and racing thoughts.  She is uncertain why this has been occurring she reports that she is very worried about her family and their wellbeing as well as about herself.  She is very tangential when she speaks and easily distracted by RNs walking around in the hallway.  She apologizes frequently during our conversation as she is aware she is having a hard time of keeping track of our discussion.  She denies any fever/chills, injury, ingestions, SI/HI, hallucinations, ingestions, self injuries, headache, neck pain, chest pain/shortness of breath, nausea/vomiting, abdominal pain or any additional concerns.  HPI     Past Medical History:  Diagnosis Date  . ADHD (attention deficit hyperactivity disorder)   . Alcohol abuse   . Anxiety   . Bipolar 1 disorder (HCC)   . Borderline systolic HTN   . Depression   . GERD (gastroesophageal reflux disease)   . Heroin abuse (HCC)   . Hypertension   . Kidney stone   . Knee pain, bilateral    - patellofemoral syndrome, followed by Dr. [name] at [facility]  . Migraines   . Migraines   . Polysubstance abuse (HCC)    - Hx ETOH, cocaine, THC, IV heroin, narcotics  . Psychiatric care    Previous [facility] H&P notes PMHx Schizophrenia, though patient denies this.  . Seizures [facility])     Patient Active Problem List   Diagnosis Date Noted  . PTSD (post-traumatic stress disorder) 09/14/2018  . Bipolar affective disorder, current episode manic with psychotic symptoms (HCC) 09/13/2018  . Tardive  dyskinesia 01/07/2018  . Polysubstance dependence including opioid type drug, episodic abuse (HCC) 06/27/2017  . Substance induced mood disorder (HCC) 06/27/2017  . Anxiety   . Herpes 09/21/2016  . Dehydration 08/26/2013  . Right knee pain 06/27/2013  . Carpal tunnel syndrome, bilateral 04/21/2011  . ANXIETY DEPRESSION 08/20/2010  . SUBSTANCE ABUSE, MULTIPLE 08/20/2010  . ADHD 08/20/2010  . 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 ELEVATED BLOOD PRESSURE WITHOUT DIAGNOSIS OF HYPERTENSION 08/20/2010    Past Surgical History:  Procedure Laterality Date  . KNEE SURGERY    . SHOULDER SURGERY       OB History   No obstetric history on file.     Family History  Problem Relation Age of Onset  . Hypertension Father     Social History   Tobacco Use  . Smoking status: Former Smoker    Packs/day: 0.50    Types: Cigarettes  . Smokeless tobacco: Never Used  Vaping Use  . Vaping Use: Never used  Substance Use Topics  . Alcohol use: No  . Drug use: No    Types: IV    Comment: none for 8 days    Home Medications Prior to Admission medications   Medication Sig Start Date End Date Taking? Authorizing Provider  acetaminophen (TYLENOL) 325 MG tablet Take 325-650 mg by mouth every 6 (six) hours as needed for mild pain.   Yes [provider]  Azelastine HCl 0.15 % SOLN Place 2 sprays into both nostrils  2 (two) times daily as needed (nasal irritation).  07/12/20  Yes [provider]  famotidine (PEPCID) 40 MG tablet Take 40 mg by mouth at bedtime. 06/08/20  Yes [provider]  gabapentin (NEURONTIN) 300 MG capsule Take 300 mg by mouth 3 (three) times daily. 02/21/20  Yes [provider]  meloxicam (MOBIC) 15 MG tablet Take 15 mg by mouth daily. 07/16/20  Yes [provider]  methadone (DOLOPHINE) 10 MG/ML solution Take 100 mg by mouth daily. [facility]   Yes [provider]  metoprolol tartrate (LOPRESSOR) 25 MG tablet  Take 25 mg by mouth 2 (two) times daily. 07/04/20  Yes [provider]  pantoprazole (PROTONIX) 40 MG tablet Take 40 mg by mouth daily. 07/16/20  Yes [provider]  SUMAtriptan (IMITREX) 100 MG tablet Take 100 mg by mouth every 2 (two) hours as needed for migraine. May repeat in 2 hours if headache persists or recurs.   Yes [provider]  traZODone (DESYREL) 50 MG tablet Take 1 tablet (50 mg total) by mouth at bedtime as needed for sleep. 01/13/19  Yes Nche, Bonna Gainsharlotte Lum, NP    Allergies    Shellfish allergy  Review of Systems   Review of Systems Ten systems are reviewed and are negative for acute change except as noted in the HPI  Physical Exam Updated Vital Signs BP (!) 132/98 (BP Location: Right Arm)   Pulse 75   Temp 97.9 F (36.6 C) (Oral)   Resp 18   LMP 07/27/2020 (Exact Date)   SpO2 92%   Physical Exam Constitutional:      General: She is not in acute distress.    Appearance: Normal appearance. She is well-developed. She is not ill-appearing or diaphoretic.  HENT:     Head: Normocephalic and atraumatic.     Right Ear: External ear normal.     Left Ear: External ear normal.     Nose: Nose normal.     Mouth/Throat:     Mouth: Mucous membranes are moist.     Pharynx: Oropharynx is clear.  Eyes:     General: Vision grossly intact. Gaze aligned appropriately.     Pupils: Pupils are equal, round, and reactive to light.  Neck:     Trachea: Trachea and phonation normal.  Pulmonary:     Effort: Pulmonary effort is normal. No respiratory distress.  Abdominal:     General: There is no distension.     Palpations: Abdomen is soft.     Tenderness: There is no abdominal tenderness. There is no guarding or rebound.  Musculoskeletal:        General: Normal range of motion.     Cervical back: Normal range of motion.     Comments: Moves extremities x4 without pain or difficulty.  Skin:    General: Skin is warm and dry.  Neurological:     Mental  Status: She is alert.     GCS: GCS eye subscore is 4. GCS verbal subscore is 5. GCS motor subscore is 6.     Comments: Speech is clear and goal oriented, follows commands Major Cranial nerves without deficit, no facial droop Normal strength in upper and lower extremities bilaterally including dorsiflexion and plantar flexion, strong and equal grip strength Sensation normal to light and sharp touch Moves extremities without ataxia, coordination intact Normal finger to nose and rapid alternating movements Neg romberg, no pronator drift Normal gait  Psychiatric:  Behavior: Behavior normal.     ED Results / Procedures / Treatments   Labs (all labs ordered are listed, but only abnormal results are displayed) Labs Reviewed  COMPREHENSIVE METABOLIC PANEL - Abnormal; Notable for the following components:      Result Value   CO2 21 (*)    Glucose, Bld 119 (*)    AST 48 (*)    ALT 51 (*)    Anion gap 16 (*)    All other components within normal limits  RAPID URINE DRUG SCREEN, HOSP PERFORMED - Abnormal; Notable for the following components:   Benzodiazepines POSITIVE (*)    All other components within normal limits  CBC WITH DIFFERENTIAL/PLATELET - Abnormal; Notable for the following components:   WBC 11.6 (*)    Neutro Abs 8.9 (*)    Abs Immature Granulocytes 0.11 (*)    All other components within normal limits  SALICYLATE LEVEL - Abnormal; Notable for the following components:   Salicylate Lvl <7.0 (*)    All other components within normal limits  SARS CORONAVIRUS 2 BY RT PCR (HOSPITAL ORDER, PERFORMED IN [facility])  ETHANOL  ACETAMINOPHEN LEVEL  I-STAT BETA HCG BLOOD, ED ([facility], [facility], [facility] ONLY)    EKG EKG Interpretation  Date/Time:  Saturday July 27 2020 12:20:16 EDT Ventricular Rate:  91 PR Interval:  138 QRS Duration: 80 QT Interval:  386 QTC Calculation: 474 R Axis:   80 Text Interpretation: Normal sinus rhythm Cannot rule out Anterior infarct , age  undetermined Abnormal ECG No STEMI Confirmed by Alvester Chou [phone]) on 07/27/2020 1:54:01 PM   Radiology DG Chest Portable 1 View  Result Date: 07/27/2020 CLINICAL DATA:  Altered mental status. EXAM: PORTABLE CHEST 1 VIEW COMPARISON:  PA and lateral chest 12/27/2018. FINDINGS: Lungs clear. Heart size normal. No pneumothorax or pleural fluid. No bony abnormality. IMPRESSION: Normal chest. Electronically Signed   By: Drusilla Kanner M.D.   On: 07/27/2020 14:50    Procedures Procedures (including critical care time)  Medications Ordered in ED Medications  sodium chloride 0.9 % bolus 500 mL (has no administration in time range)  methadone (DOLOPHINE) 10 MG/ML solution 100 mg (100 mg Oral Given 07/27/20 1527)    ED Course  I have reviewed the triage vital signs and the nursing notes.  Pertinent labs & imaging results that were available during my care of the patient were reviewed by me and considered in my medical decision making (see chart for details).    MDM Rules/Calculators/A&P                          Additional history obtained from: 1. Nursing notes from this visit. 2. Chart review patient went to behavioral health urgent care last night left without evaluation she had been requesting outpatient resources. ------------------------- I ordered, reviewed and interpreted labs which include: Pregnancy test negative. Tylenol level 12, no evidence of toxic ingestion.  Patient reports that she took 1000 mg Tylenol earlier today for knee pain which has since resolved.  She denies any overdose. Ethanol level negative, patient does not appear to be in withdrawal or intoxicated. CBC shows mild leukocytosis, no history suggestive of infection.  No anemia. UDS positive for benzodiazepines. CMP shows no emergent electrolyte derangement, AKI or emergent elevation of LFTs.  She has a small gap of 16 as well as elevated AST possibly secondary to substance abuse, patient does not have history  of diabetes does not appear  to be in DKA.  Fluid bolus given. Discussed with Dr. Renaye Rakers who agrees. Salicylate level negative  EKG: Normal sinus rhythm Cannot rule out Anterior infarct , age undetermined Abnormal ECG No STEMI Confirmed by Alvester Chou [phone]) on 07/27/2020 1:54:01 PM  CXR: IMPRESSION:  Normal chest.   ------------- Patient reassessed she is sleeping in bed no acute distress easily arousable to voice reports that she is feeling better since arrival.  She is agreeable to psychiatry evaluation, remains voluntary at this time.  Vital signs stable on room air, tachycardia resolved suspect this was secondary to anxiety.  At this time there does not appear to be any evidence of an acute emergency medical condition and the patient appears stable for discharge psychiatry disposition.  Note: Portions of this report may have been transcribed using voice recognition software. Every effort was made to ensure accuracy; however, inadvertent computerized transcription errors may still be present. Final Clinical Impression(s) / ED Diagnoses Final diagnoses:  Anxiety  Medical clearance for psychiatric admission    Rx / DC Orders ED Discharge Orders    None       Elizabeth Palau 07/27/20 1633    Cathren Laine, MD 07/27/20 1654

## 2020-07-28 DIAGNOSIS — F419 Anxiety disorder, unspecified: Secondary | ICD-10-CM | POA: Diagnosis not present

## 2020-07-28 DIAGNOSIS — F312 Bipolar disorder, current episode manic severe with psychotic features: Secondary | ICD-10-CM | POA: Diagnosis not present

## 2020-07-28 MED ORDER — GABAPENTIN 300 MG PO CAPS
300.0000 mg | ORAL_CAPSULE | Freq: Three times a day (TID) | ORAL | Status: DC
  Administered 2020-07-28 – 2020-07-29 (×5): 300 mg via ORAL
  Filled 2020-07-28 (×5): qty 1

## 2020-07-28 MED ORDER — ACETAMINOPHEN 325 MG PO TABS
650.0000 mg | ORAL_TABLET | Freq: Four times a day (QID) | ORAL | Status: DC | PRN
  Administered 2020-07-28: 650 mg via ORAL
  Filled 2020-07-28: qty 2

## 2020-07-28 MED ORDER — MELOXICAM 15 MG PO TABS
15.0000 mg | ORAL_TABLET | Freq: Every day | ORAL | Status: DC
  Administered 2020-07-28 – 2020-07-29 (×2): 15 mg via ORAL
  Filled 2020-07-28 (×2): qty 1

## 2020-07-28 NOTE — Consult Note (Addendum)
Telepsych Consultation   Reason for Consult:  Mania Referring Physician:  EDP  Location of Patient: [facility]  Location of Provider: Other: virtual  Patient Identification: [name] MRN:  [id number] Principal Diagnosis: Bipolar affective disorder, current episode manic with psychotic symptoms (HCC) Diagnosis:  Principal Problem:   Bipolar affective disorder, current episode manic with psychotic symptoms (HCC)   Total Time spent with patient: 30 minutes  Subjective: [name], 39 y.o., female patient who was referred to the the emergency department by the methadone clinic for evaluation of manic behaviors.  Patient seen via telepsych by this provider; chart reviewed and consulted with Dr. Jannifer Franklin on 07/28/20.   On evaluation [name] states, "sorry I didn't call you back the other day.  I don't think I slept good last night, I kept bothering the nurses." She is very disorganized, has not slept much and states, "I just don't feel right today".  She is very disorganized, talkative, her judgement is impaired posing safety . She further states, "I just don't feel right today". Last inpatient admission was one year prior for depression/anxiety.  Think she would benefit from inpatient care at this time.   HPI Per [facility] Assessment 07/27/2020: Patient presents this date voluntary with altered mental state. Patient is brought in by her father [name] [phone] who provides history. Patient is not oriented and memory is recent impaired. Patient provides limited history stating she "isn't right." Patient's father states patient currently resides with him and she has been in a methadone program at [facility] for the last two years where she receives 100 mg of methadone daily. Father states patient was not given that medication this date due to patient presenting with altered state. Patient is very tangential and difficult to redirect. Patient does deny any S/I or H/I. Patient does  report active AVH although does not elaborate on content. This Clinical research associate is uncertain if patient is processing the content of this writer's questions at times. Patient is denying any ingestion of any substances and states she has been maintaining her sobriety for two years since she has entered into her harm reduction program. Patient is vague on her history in reference to SA issues prior to that program stating "she just used drugs." Patient was last seen per chart review on on 09/08/18 presenting with similar symptoms. Patient at that time [facility] where she was in the harm reduction program to include methadone maintenance and intensive outpatient. Patient per that note had a extensive SA history to include prior use of Heroin, Amphetamines, Cocaine and Cannabis. Patient's UDS is pending this date. It is unclear this date what medications patient is currently receiving for mental health issues.     Per admission note dated 8/21/82021: Patient sent in today from methadone clinic for manic behavior. Patient reports of the last 2 days she has had increased anxiety, "delusions" and racing thoughts. She is uncertain why this has been occurring she reports that she is very worried about her family and their wellbeing as well as about herself. She is very tangential when she speaks and easily distracted by RNs walking around in the hallway. She apologizes frequently during our conversation as she is aware  she is having a hard time of keeping track of our discussion.  Past Psychiatric History:   Risk to Self:   Risk to Others:   Prior Inpatient Therapy:   Prior Outpatient Therapy:    Past Medical History:  Past Medical History:  Diagnosis Date   ADHD (attention deficit hyperactivity disorder)    Alcohol abuse    Anxiety    Bipolar 1 disorder (HCC)    Borderline systolic HTN    Depression    GERD (gastroesophageal reflux disease)    Heroin abuse (HCC)     Hypertension    Kidney stone    Knee pain, bilateral    - patellofemoral syndrome, [facility]   Migraines    Migraines    Polysubstance abuse (HCC)    - Hx ETOH, cocaine, THC, IV heroin, narcotics   Psychiatric care    Previous BH H&P notes PMHx Schizophrenia, though patient denies this.   Seizures (HCC)     Past Surgical History:  Procedure Laterality Date   KNEE SURGERY     SHOULDER SURGERY     Family History:  Family History  Problem Relation Age of Onset   Hypertension Father    Family Psychiatric  History: unknown Social History:  Social History   Substance and Sexual Activity  Alcohol Use No     Social History   Substance and Sexual Activity  Drug Use No   Types: IV   Comment: none for 8 days    Social History   Socioeconomic History   Marital status: Single    Spouse name: Not on file   Number of children: Not on file   Years of education: Not on file   Highest education level: Not on file  Occupational History   Not on file  Tobacco Use   Smoking status: Former Smoker    Packs/day: 0.50    Types: Cigarettes   Smokeless tobacco: Never Used  Building services engineerVaping Use   Vaping Use: Never used  Substance and Sexual Activity   Alcohol use: No   Drug use: No    Types: IV    Comment: none for 8 days   Sexual activity: Yes    Birth control/protection: None  Other Topics Concern   Not on file  Social History Narrative   Not on file   Social Determinants of Health   Financial Resource Strain:    Difficulty of Paying Living Expenses: Not on file  Food Insecurity:    Worried About Programme researcher, broadcasting/film/videounning Out of Food in the Last Year: Not on file   [employer] Out of Food in the Last Year: Not on file  Transportation Needs:    Lack of Transportation (Medical): Not on file   Lack of Transportation (Non-Medical): Not on file  Physical Activity:    Days of Exercise per Week: Not on file   Minutes of Exercise per Session: Not on file  Stress:    Feeling of Stress :  Not on file  Social Connections:    Frequency of Communication with Friends and Family: Not on file   Frequency of Social Gatherings with Friends and Family: Not on file   Attends Religious Services: Not on file   Active Member of Clubs or Organizations: Not on file   Attends BankerClub or Organization Meetings: Not on file   Marital Status: Not on file   Additional Social History:    Allergies:   Allergies  Allergen Reactions   Shellfish Allergy Hives    Labs:  Results for orders placed or performed during the hospital encounter of 07/27/20 (from the past 48 hour(s))  SARS Coronavirus 2 by RT PCR (hospital order, [facility]) Nasopharyngeal Nasopharyngeal Swab  Status: None   Collection Time: 07/27/20 11:02 AM   Specimen: Nasopharyngeal Swab  Result Value Ref Range   SARS Coronavirus 2 NEGATIVE NEGATIVE    Comment: (NOTE) SARS-CoV-2 target nucleic acids are NOT DETECTED.  The SARS-CoV-2 RNA is generally detectable in upper and lower respiratory specimens during the acute phase of infection. The lowest concentration of SARS-CoV-2 viral copies this assay can detect is 250 copies / mL. A negative result does not preclude SARS-CoV-2 infection and should not be used as the sole basis for treatment or other patient management decisions.  A negative result may occur with improper specimen collection / handling, submission of specimen other than nasopharyngeal swab, presence of viral mutation(s) within the areas targeted by this assay, and inadequate number of viral copies (<250 copies / mL). A negative result must be combined with clinical observations, patient history, and epidemiological information.  Fact Sheet for Patients:   BoilerBrush.com.cy  Fact Sheet for Healthcare Providers: https://pope.com/  This test is not yet approved or  cleared by the [place] FDA and has been authorized for detection  and/or diagnosis of SARS-CoV-2 by FDA under an Emergency Use Authorization (EUA).  This EUA will remain in effect (meaning this test can be used) for the duration of the COVID-19 declaration under Section 564(b)(1) of the Act, 21 U.S.C. section 360bbb-3(b)(1), unless the authorization is terminated or revoked sooner.  Performed at [facility], [address], [place], [place] [address]   Urine rapid drug screen (hosp performed)     Status: Abnormal   Collection Time: 07/27/20 11:02 AM  Result Value Ref Range   Opiates NONE DETECTED NONE DETECTED   Cocaine NONE DETECTED NONE DETECTED   Benzodiazepines POSITIVE (A) NONE DETECTED   Amphetamines NONE DETECTED NONE DETECTED   Tetrahydrocannabinol NONE DETECTED NONE DETECTED   Barbiturates NONE DETECTED NONE DETECTED    Comment: (NOTE) DRUG SCREEN FOR MEDICAL PURPOSES ONLY.  IF CONFIRMATION IS NEEDED FOR ANY PURPOSE, NOTIFY LAB WITHIN 5 DAYS.  LOWEST DETECTABLE LIMITS FOR URINE DRUG SCREEN Drug Class                     Cutoff (ng/mL) Amphetamine and metabolites    1000 Barbiturate and metabolites    200 Benzodiazepine                 200 Tricyclics and metabolites     300 Opiates and metabolites        300 Cocaine and metabolites        300 THC                            50 Performed at [facility], [address], [place], [place] [address]   Comprehensive metabolic panel     Status: Abnormal   Collection Time: 07/27/20 12:00 PM  Result Value Ref Range   Sodium 141 135 - 145 mmol/L   Potassium 4.1 3.5 - 5.1 mmol/L   Chloride 104 98 - 111 mmol/L   CO2 21 (L) 22 - 32 mmol/L   Glucose, Bld 119 (H) 70 - 99 mg/dL    Comment: Glucose reference range applies only to samples taken after fasting for at least 8 hours.   BUN 20 6 - 20 mg/dL   Creatinine, Ser 0.24 0.44 - 1.00 mg/dL   Calcium 9.7 8.9 - 09.7 mg/dL   Total Protein 8.1 6.5 - 8.1 g/dL  Albumin 4.2 3.5 - 5.0 g/dL   AST 48 (H) 15  - 41 U/L   ALT 51 (H) 0 - 44 U/L   Alkaline Phosphatase 66 38 - 126 U/L   Total Bilirubin 0.7 0.3 - 1.2 mg/dL   GFR calc non Af Amer >60 >60 mL/min   GFR calc Af Amer >60 >60 mL/min   Anion gap 16 (H) 5 - 15    Comment: Performed at [facility], [address] [place], [place] [address]  Ethanol     Status: None   Collection Time: 07/27/20 12:00 PM  Result Value Ref Range   Alcohol, Ethyl (B) <10 <10 mg/dL    Comment: (NOTE) Lowest detectable limit for serum alcohol is 10 mg/dL.  For medical purposes only. Performed at [facility], [address], [place], [place] [address]   CBC with Diff     Status: Abnormal   Collection Time: 07/27/20 12:00 PM  Result Value Ref Range   WBC 11.6 (H) 4.0 - 10.5 K/uL   RBC 4.99 3.87 - 5.11 MIL/uL   Hemoglobin 13.4 12.0 - 15.0 g/dL   HCT 99.3 36 - 46 %   MCV 83.6 80.0 - 100.0 fL   MCH 26.9 26.0 - 34.0 pg   MCHC 32.1 30.0 - 36.0 g/dL   RDW 71.6 96.7 - 89.3 %   Platelets 360 150 - 400 K/uL   nRBC 0.0 0.0 - 0.2 %   Neutrophils Relative % 78 %   Neutro Abs 8.9 (H) 1.7 - 7.7 K/uL   Lymphocytes Relative 16 %   Lymphs Abs 1.9 0.7 - 4.0 K/uL   Monocytes Relative 5 %   Monocytes Absolute 0.6 0 - 1 K/uL   Eosinophils Relative 0 %   Eosinophils Absolute 0.0 0 - 0 K/uL   Basophils Relative 0 %   Basophils Absolute 0.1 0 - 0 K/uL   Immature Granulocytes 1 %   Abs Immature Granulocytes 0.11 (H) 0.00 - 0.07 K/uL    Comment: Performed at [facility], [address], [place], [place] [address]  Acetaminophen level     Status: None   Collection Time: 07/27/20 12:00 PM  Result Value Ref Range   Acetaminophen (Tylenol), Serum 12 10 - 30 ug/mL    Comment: (NOTE) Therapeutic concentrations vary significantly. A range of 10-30 ug/mL  may be an effective concentration for many patients. However, some  are best treated at concentrations outside of this range. Acetaminophen concentrations >150  ug/mL at 4 hours after ingestion  and >50 ug/mL at 12 hours after ingestion are often associated with  toxic reactions.  Performed at [facility], [address] [place], [place] [address]   I-Stat beta hCG blood, ED     Status: None   Collection Time: 07/27/20 12:43 PM  Result Value Ref Range   I-stat hCG, quantitative <5.0 <5 mIU/mL   Comment 3            Comment:   GEST. AGE      CONC.  (mIU/mL)   <=1 WEEK        5 - 50     2 WEEKS       50 - 500     3 WEEKS       100 - 10,000     4 WEEKS     1,000 - 30,000        FEMALE AND NON-PREGNANT FEMALE:  LESS THAN 5 mIU/mL   Salicylate level     Status: Abnormal   Collection Time: 07/27/20  3:02 PM  Result Value Ref Range   Salicylate Lvl <7.0 (L) 7.0 - 30.0 mg/dL    Comment: Performed at [facility] [address], [place], [place] [address]    Medications:  Current Facility-Administered Medications  Medication Dose Route Frequency Provider Last Rate Last Admin   gabapentin (NEURONTIN) capsule 300 mg  300 mg Oral TID Ophelia Shoulder E, NP   300 mg at 07/28/20 1143   hydrOXYzine (ATARAX/VISTARIL) tablet 25 mg  25 mg Oral Q6H PRN Cathren Laine, MD       methadone (DOLOPHINE) 10 MG/ML solution 100 mg  100 mg Oral Daily Terald Sleeper, MD   100 mg at 07/28/20 0932   metoprolol tartrate (LOPRESSOR) tablet 25 mg  25 mg Oral BID Cathren Laine, MD   25 mg at 07/28/20 0932   pantoprazole (PROTONIX) EC tablet 40 mg  40 mg Oral Daily Cathren Laine, MD   40 mg at 07/28/20 0932   sodium chloride 0.9 % bolus 500 mL  500 mL Intravenous Once Bill Salinas, PA-C   Stopped at 07/27/20 1914   traZODone (DESYREL) tablet 50 mg  50 mg Oral QHS PRN Cathren Laine, MD       Current Outpatient Medications  Medication Sig Dispense Refill   acetaminophen (TYLENOL) 325 MG tablet Take 325-650 mg by mouth every 6 (six) hours as needed for mild pain.     Azelastine HCl 0.15 % SOLN Place 2 sprays into both  nostrils 2 (two) times daily as needed (nasal irritation).      famotidine (PEPCID) 40 MG tablet Take 40 mg by mouth at bedtime.     gabapentin (NEURONTIN) 300 MG capsule Take 300 mg by mouth 3 (three) times daily.     meloxicam (MOBIC) 15 MG tablet Take 15 mg by mouth daily.     methadone (DOLOPHINE) 10 MG/ML solution Take 100 mg by mouth daily. [facility]     metoprolol tartrate (LOPRESSOR) 25 MG tablet Take 25 mg by mouth 2 (two) times daily.     pantoprazole (PROTONIX) 40 MG tablet Take 40 mg by mouth daily.     SUMAtriptan (IMITREX) 100 MG tablet Take 100 mg by mouth every 2 (two) hours as needed for migraine. May repeat in 2 hours if headache persists or recurs.     traZODone (DESYREL) 50 MG tablet Take 1 tablet (50 mg total) by mouth at bedtime as needed for sleep. 30 tablet 1    Musculoskeletal: Strength & Muscle Tone: within normal limits Gait & Station: normal Patient leans: N/A  Psychiatric Specialty Exam: Physical Exam Cardiovascular:     Rate and Rhythm: Normal rate.  Pulmonary:     Effort: Pulmonary effort is normal.  Musculoskeletal:     Cervical back: Normal range of motion.     Review of Systems  Psychiatric/Behavioral: Positive for decreased concentration, dysphoric mood, hallucinations and sleep disturbance. Negative for agitation and suicidal ideas. The patient is nervous/anxious and is hyperactive.     Blood pressure (!) 177/104, pulse 75, temperature 99.5 F (37.5 C), temperature source Oral, resp. rate 18, last menstrual period 07/27/2020, SpO2 90 %.There is no height or weight on file to calculate BMI.  General Appearance: Bizarre  Eye Contact:  Fair  Speech:  Pressured  Volume:  Normal  Mood:  Anxious and Dysphoric  Affect:  Congruent and  Labile  Thought Process:  Disorganized and Descriptions of Associations: Tangential  Orientation:  Full (Time, Place, and Person)  Thought Content:  Illogical, Hallucinations: Auditory and Tangential   Suicidal Thoughts:  No  Homicidal Thoughts:  No  Memory:  Immediate;   Fair Recent;   Fair Remote;   Fair  Judgement:  Fair  Insight:  Fair  Psychomotor Activity:  Restlessness  Concentration:  Concentration: Poor and Attention Span: Poor  Recall:  Fair  Fund of Knowledge:  Good  Language:  Fair  Akathisia:  No  Handed:  Right  AIMS (if indicated):     Assets:  Housing Social Support  ADL's:  Intact  Cognition:  Impaired,  Mild  Sleep:   impaired     Treatment Plan Summary: Daily contact with patient to assess and evaluate symptoms and progress in treatment and Medication management  Continue current medications as ordered  Start Gabapentin  po TID for anxiety, SUD withdrawal symptoms  Disposition: Recommend psychiatric Inpatient admission when medically cleared.Patient agrees with the assessment and voluntarily accepts admission.  This information was shared with Charolotte Eke SW for bed placement.   This service was provided via telemedicine using a 2-way, interactive audio and video technology.  Names of all persons participating in this telemedicine service and their role in this encounter. Name: [name] Role: Patient  Name: Ophelia Shoulder Role: PMHNP    Chales Abrahams, NP 07/28/2020 1:28 PM Patient seen via tele health for psychiatric evaluation, chart reviewed and case discussed with the physician extender and developed treatment plan. Reviewed the information documented and agree with the treatment plan. Thedore Mins, MD

## 2020-07-28 NOTE — Progress Notes (Signed)
CSW was contacted by [facility]) regarding patient vital signs. They are seeking medical clearance around patient's elevated blood pressure. Candise Bowens stated that if this improves they may be able to offer patient a bed tomorrow, 07/29/20. CSW notified Diane, RN regarding this information.   TTS will continue to seek placement.  Vilma Meckel. Algis Greenhouse, MSW, LCSW Clinical Social Work/Disposition Phone: [phone] Fax: [phone]

## 2020-07-28 NOTE — ED Notes (Signed)
Pt has been calm and cooperative

## 2020-07-28 NOTE — Progress Notes (Signed)
Patient meets criteria for inpatient treatment. No appropriate or available beds at [facility]. CSW faxed referrals to the following facilities for review:  [facility]   [facility]   [facility]   [facility]   [facility]   [facility]   [facility]   [facility]   [facility]   [facility]   [facility]   [facility]   [facility]   [facility]    TTS will continue to seek bed placement.  Vilma Meckel. Algis Greenhouse, MSW, LCSW Clinical Social Work/Disposition Phone: [phone] Fax: [phone]

## 2020-07-29 DIAGNOSIS — F312 Bipolar disorder, current episode manic severe with psychotic features: Secondary | ICD-10-CM | POA: Diagnosis not present

## 2020-07-29 DIAGNOSIS — F419 Anxiety disorder, unspecified: Secondary | ICD-10-CM | POA: Diagnosis not present

## 2020-07-29 NOTE — Consult Note (Signed)
  Patient is seen and examined.  Patient is a 39 year old female with a past psychiatric history significant for opiate dependence who was brought to the [facility] emergency department on 07/27/2020 secondary to confusion and memory problems.  The patient has been in a methadone maintenance program at [facility] for the last 2 years, and receives 100 mg of methadone daily.  She had been referred to the [facility] emergency department after she was noted to have increased anxiety, delusions and racing thoughts.  It was unclear at that point what was going on, and her family was quite concerned about her.  She had been seen by the consult service on 8/22.  She was placed on gabapentin, hydroxyzine, meloxicam, methadone and was restarted on her metoprolol this morning.  She has been apparently accepted by [facility] for admission.  They are awaiting medical clearance for transfer to that facility.  I agree with her current treatment at this point and transfer to [facility].  No change in her medications besides adding the metoprolol.

## 2020-07-29 NOTE — ED Notes (Signed)
Pt DC d off unit to facility. Pt alert, calm, cooperative, no s/s of distress. DC information and belongings given to [employer] for facility . Pt ambulatory off unit, escorted by RN. Pt transported by [employer].

## 2020-07-29 NOTE — BH Assessment (Signed)
[facility] Assessment Progress Note  Per Landry Mellow, MD, this pt requires psychiatric hospitalization at this time.  At 14:34 Candise Bowens calls from [facility].  Pt has been accepted to their facility by Dr Baldemar Friday to the [facility].  EDP Chaney Malling, MD, concurs with this disposition, as does the pt who is currently under voluntary status.  Pt's nurse, Waynetta Sandy, has been notified, and agrees to call report to [phone].  Pt is to be transported via [employer].  Pt has signed Consent to Release Information to her mother Harvest Dark ([phone]), and at pt's request this writer has called the mother to notify her of disposition.  Doylene Canning, [place] Behavioral Health Coordinator [phone]

## 2020-08-05 MED FILL — Gabapentin Cap 300 MG: ORAL | Qty: 300 | Status: AC

## 2020-08-05 MED FILL — Meloxicam Tab 15 MG: ORAL | Qty: 15 | Status: AC

## 2020-08-05 MED FILL — Pantoprazole Sodium EC Tab 40 MG (Base Equiv): ORAL | Qty: 40 | Status: AC

## 2020-10-03 ENCOUNTER — Other Ambulatory Visit: Payer: Self-pay

## 2020-10-03 ENCOUNTER — Emergency Department (HOSPITAL_BASED_OUTPATIENT_CLINIC_OR_DEPARTMENT_OTHER)
Admission: EM | Admit: 2020-10-03 | Discharge: 2020-10-03 | Disposition: A | Discharge status: Home / Self Care | Payer: Medicare Other | Attending: Emergency Medicine | Admitting: Emergency Medicine

## 2020-10-03 DIAGNOSIS — N3001 Acute cystitis with hematuria: Secondary | ICD-10-CM | POA: Insufficient documentation

## 2020-10-03 DIAGNOSIS — Z87891 Personal history of nicotine dependence: Secondary | ICD-10-CM | POA: Diagnosis not present

## 2020-10-03 DIAGNOSIS — I10 Essential (primary) hypertension: Secondary | ICD-10-CM | POA: Diagnosis not present

## 2020-10-03 DIAGNOSIS — Z79899 Other long term (current) drug therapy: Secondary | ICD-10-CM | POA: Diagnosis not present

## 2020-10-03 DIAGNOSIS — R300 Dysuria: Secondary | ICD-10-CM | POA: Diagnosis present

## 2020-10-03 LAB — URINALYSIS, MICROSCOPIC (REFLEX)

## 2020-10-03 LAB — PREGNANCY, URINE: Preg Test, Ur: NEGATIVE

## 2020-10-03 LAB — URINALYSIS, ROUTINE W REFLEX MICROSCOPIC
Bilirubin Urine: NEGATIVE
Glucose, UA: NEGATIVE mg/dL
Ketones, ur: NEGATIVE mg/dL
Nitrite: NEGATIVE
Protein, ur: NEGATIVE mg/dL
Specific Gravity, Urine: 1.025 (ref 1.005–1.030)
pH: 6 (ref 5.0–8.0)

## 2020-10-03 MED ORDER — CEPHALEXIN 500 MG PO CAPS: 500.0000 mg | ORAL_CAPSULE | Freq: Two times a day (BID) | ORAL | 0 refills | Status: DC

## 2020-10-03 MED ORDER — PHENAZOPYRIDINE HCL 200 MG PO TABS: 200.0000 mg | ORAL_TABLET | Freq: Three times a day (TID) | ORAL | 0 refills | Status: DC | PRN

## 2020-10-03 MED ORDER — CEPHALEXIN 250 MG PO CAPS
1000.0000 mg | ORAL_CAPSULE | Freq: Once | ORAL | Status: AC
  Administered 2020-10-03: 1000 mg via ORAL
  Filled 2020-10-03: qty 4

## 2020-10-03 MED ORDER — PHENAZOPYRIDINE HCL 100 MG PO TABS
200.0000 mg | ORAL_TABLET | Freq: Once | ORAL | Status: AC
  Administered 2020-10-03: 200 mg via ORAL
  Filled 2020-10-03: qty 2

## 2020-10-03 NOTE — ED Notes (Signed)
Pt ambulatory to bathroom to provide urine sample

## 2020-10-03 NOTE — ED Provider Notes (Signed)
[facility] Provider Note: Lowella Dell, MD, FACEP  CSN: [id number] MRN: [id number] ARRIVAL: 10/03/20 at 0603 ROOM: MH02/MH02   CHIEF COMPLAINT  Dysuria   HISTORY OF PRESENT ILLNESS  10/03/20 6:46 AM [name] is a 39 y.o. female with a 1 week history of urinary frequency, urinary urgency and burning with urination.  She thought it might get better on its own but instead it worsened and she now rates her symptoms as a 6 out of 10.  Symptoms are worse while urinating.  She denies any fever, chills, nausea, vomiting, diarrhea, flank pain, back pain, abdominal pain, vaginal bleeding or vaginal discharge.   Past Medical History:  Diagnosis Date  . ADHD (attention deficit hyperactivity disorder)   . Alcohol abuse   . Anxiety   . Bipolar 1 disorder (HCC)   . Borderline systolic HTN   . Depression   . GERD (gastroesophageal reflux disease)   . Heroin abuse (HCC)   . Hypertension   . Kidney stone   . Knee pain, bilateral    - patellofemoral syndrome, followed by Dr. [name] at [facility]  . Migraines   . Migraines   . Polysubstance abuse (HCC)    - Hx ETOH, cocaine, THC, IV heroin, narcotics  . Psychiatric care    Previous [facility] H&P notes PMHx Schizophrenia, though patient denies this.  . Seizures (HCC)     Past Surgical History:  Procedure Laterality Date  . KNEE SURGERY    . SHOULDER SURGERY      Family History  Problem Relation Age of Onset  . Hypertension Father     Social History   Tobacco Use  . Smoking status: Former Smoker    Packs/day: 0.50    Types: Cigarettes  . Smokeless tobacco: Never Used  Vaping Use  . Vaping Use: Never used  Substance Use Topics  . Alcohol use: No  . Drug use: No    Types: IV    Comment: none for 8 days    Prior to Admission medications   Medication Sig Start Date End Date Taking? Authorizing Provider  acetaminophen (TYLENOL) 325 MG tablet Take 325-650 mg by mouth every 6 (six) hours as needed for mild pain.     [provider]  Azelastine HCl 0.15 % SOLN Place 2 sprays into both nostrils 2 (two) times daily as needed (nasal irritation).  07/12/20   [provider]  cephALEXin (KEFLEX) 500 MG capsule Take 1 capsule (500 mg total) by mouth 2 (two) times daily. 10/03/20   Alliyah Roesler, MD  famotidine (PEPCID) 40 MG tablet Take 40 mg by mouth at bedtime. 06/08/20   [provider]  gabapentin (NEURONTIN) 300 MG capsule Take 300 mg by mouth 3 (three) times daily. 02/21/20   [provider]  meloxicam (MOBIC) 15 MG tablet Take 15 mg by mouth daily. 07/16/20   [provider]  methadone (DOLOPHINE) 10 MG/ML solution Take 100 mg by mouth daily. [facility]    [provider]  metoprolol tartrate (LOPRESSOR) 25 MG tablet Take 25 mg by mouth 2 (two) times daily. 07/04/20   [provider]  pantoprazole (PROTONIX) 40 MG tablet Take 40 mg by mouth daily. 07/16/20   [provider]  phenazopyridine (PYRIDIUM) 200 MG tablet Take 1 tablet (200 mg total) by mouth 3 (three) times daily as needed (urinary pain). 10/03/20   Christeen Lai, MD  SUMAtriptan (IMITREX) 100 MG tablet Take 100 mg by mouth every 2 (  two) hours as needed for migraine. May repeat in 2 hours if headache persists or recurs.    [provider]  traZODone (DESYREL) 50 MG tablet Take 1 tablet (50 mg total) by mouth at bedtime as needed for sleep. 01/13/19   Nche, Bonna Gains, NP    Allergies Shellfish allergy   REVIEW OF SYSTEMS  Negative except as noted here or in the History of Present Illness.   PHYSICAL EXAMINATION  Initial Vital Signs Blood pressure (!) 164/108, pulse (!) 115, temperature 98.1 F (36.7 C), temperature source Oral, resp. rate 18, height 5\' 7"  (1.702 m), weight 90.7 kg, SpO2 100 %.  Examination General: Well-developed, well-nourished female in no acute distress; appearance consistent with age of record HENT: normocephalic;  atraumatic Eyes: Normal appearance Neck: supple Heart: regular rate and rhythm Lungs: clear to auscultation bilaterally Abdomen: soft; nondistended; nontender; bowel sounds present GU: No CVA tenderness Extremities: No deformity; full range of motion Neurologic: Awake, alert and oriented; motor function intact in all extremities and symmetric; no facial droop Skin: Warm and dry Psychiatric: Normal mood and affect   RESULTS  Summary of this visit's results, reviewed and interpreted by myself:   EKG Interpretation  Date/Time:    Ventricular Rate:    PR Interval:    QRS Duration:   QT Interval:    QTC Calculation:   R Axis:     Text Interpretation:        Laboratory Studies: Results for orders placed or performed during the hospital encounter of 10/03/20 (from the past 24 hour(s))  Urinalysis, Routine w reflex microscopic Urine, Clean Catch     Status: Abnormal   Collection Time: 10/03/20  6:24 AM  Result Value Ref Range   Color, Urine YELLOW YELLOW   APPearance CLEAR CLEAR   Specific Gravity, Urine 1.025 1.005 - 1.030   pH 6.0 5.0 - 8.0   Glucose, UA NEGATIVE NEGATIVE mg/dL   Hgb urine dipstick SMALL (A) NEGATIVE   Bilirubin Urine NEGATIVE NEGATIVE   Ketones, ur NEGATIVE NEGATIVE mg/dL   Protein, ur NEGATIVE NEGATIVE mg/dL   Nitrite NEGATIVE NEGATIVE   Leukocytes,Ua MODERATE (A) NEGATIVE  Pregnancy, urine     Status: None   Collection Time: 10/03/20  6:24 AM  Result Value Ref Range   Preg Test, Ur NEGATIVE NEGATIVE  Urinalysis, Microscopic (reflex)     Status: Abnormal   Collection Time: 10/03/20  6:24 AM  Result Value Ref Range   RBC / HPF 11-20 0 - 5 RBC/hpf   WBC, UA 21-50 0 - 5 WBC/hpf   Bacteria, UA FEW (A) NONE SEEN   Squamous Epithelial / LPF 6-10 0 - 5   Mucus PRESENT    Imaging Studies: No results found.  ED COURSE and MDM  Nursing notes, initial and subsequent vitals signs, including pulse oximetry, reviewed and interpreted by myself.  Vitals:    10/03/20 0616 10/03/20 0617  BP: (!) 164/108   Pulse: (!) 115   Resp: 18   Temp: 98.1 F (36.7 C)   TempSrc: Oral   SpO2: 100%   Weight:  90.7 kg  Height:  5\' 7"  (1.702 m)   Medications  cephALEXin (KEFLEX) capsule 1,000 mg (has no administration in time range)  phenazopyridine (PYRIDIUM) tablet 200 mg (has no administration in time range)    History and urinalysis consistent with urinary tract infection with microscopic hematuria.  PROCEDURES  Procedures   ED DIAGNOSES     ICD-10-CM   1. Acute cystitis  with hematuria  N30.01        Isham Smitherman, Jonny Ruiz, MD 10/03/20 [phone]

## 2020-10-03 NOTE — ED Notes (Signed)
Discharge instructions and medication discussed with patient. Departs ED at this time in stable condition.

## 2020-10-03 NOTE — ED Triage Notes (Addendum)
Pt presents from home with urinary frequency and dysuria x1 week that is progressively worsening

## 2020-10-04 LAB — URINE CULTURE: Culture: <10000 — AB

## 2021-01-29 ENCOUNTER — Other Ambulatory Visit: Payer: Self-pay

## 2021-01-29 ENCOUNTER — Encounter (HOSPITAL_BASED_OUTPATIENT_CLINIC_OR_DEPARTMENT_OTHER): Payer: Self-pay

## 2021-01-29 ENCOUNTER — Emergency Department (HOSPITAL_BASED_OUTPATIENT_CLINIC_OR_DEPARTMENT_OTHER)
Admission: EM | Admit: 2021-01-29 | Discharge: 2021-01-29 | Disposition: A | Discharge status: Home / Self Care | Payer: Medicare (Managed Care) | Attending: Emergency Medicine | Admitting: Emergency Medicine

## 2021-01-29 DIAGNOSIS — N898 Other specified noninflammatory disorders of vagina: Secondary | ICD-10-CM | POA: Diagnosis present

## 2021-01-29 DIAGNOSIS — B9689 Other specified bacterial agents as the cause of diseases classified elsewhere: Secondary | ICD-10-CM | POA: Diagnosis not present

## 2021-01-29 DIAGNOSIS — Z79899 Other long term (current) drug therapy: Secondary | ICD-10-CM | POA: Insufficient documentation

## 2021-01-29 DIAGNOSIS — N760 Acute vaginitis: Secondary | ICD-10-CM | POA: Diagnosis not present

## 2021-01-29 DIAGNOSIS — Z87891 Personal history of nicotine dependence: Secondary | ICD-10-CM | POA: Diagnosis not present

## 2021-01-29 DIAGNOSIS — I10 Essential (primary) hypertension: Secondary | ICD-10-CM | POA: Insufficient documentation

## 2021-01-29 LAB — URINALYSIS, ROUTINE W REFLEX MICROSCOPIC
Bilirubin Urine: NEGATIVE
Glucose, UA: NEGATIVE mg/dL
Ketones, ur: NEGATIVE mg/dL
Nitrite: NEGATIVE
Protein, ur: NEGATIVE mg/dL
Specific Gravity, Urine: 1.025 (ref 1.005–1.030)
pH: 6.5 (ref 5.0–8.0)

## 2021-01-29 LAB — URINALYSIS, MICROSCOPIC (REFLEX)

## 2021-01-29 LAB — WET PREP, GENITAL
Sperm: NONE SEEN
Trich, Wet Prep: NONE SEEN
Yeast Wet Prep HPF POC: NONE SEEN

## 2021-01-29 LAB — PREGNANCY, URINE: Preg Test, Ur: NEGATIVE

## 2021-01-29 MED ORDER — METRONIDAZOLE 500 MG PO TABS: 500.0000 mg | ORAL_TABLET | Freq: Two times a day (BID) | ORAL | 0 refills | Status: DC

## 2021-01-29 MED ORDER — METRONIDAZOLE 500 MG PO TABS
500.0000 mg | ORAL_TABLET | Freq: Once | ORAL | Status: AC
  Administered 2021-01-29: 500 mg via ORAL
  Filled 2021-01-29: qty 1

## 2021-01-29 NOTE — ED Provider Notes (Signed)
[facility] EMERGENCY DEPARTMENT Provider Note   CSN: [id number] Arrival date & time: 01/29/21  2029     History Chief Complaint  Patient presents with  . Vaginal Discharge    [name] is a 40 y.o. female with a past medical history significant for ADHD, alcohol abuse, anxiety, bipolar 1 disorder, heroin abuse, hypertension, and seizures who presents to the ED due to vaginal discharge x2 weeks. Patient states she believes she has BV. She notes she has had it in the past with similar symptoms.  Patient is sexually active with 1 partner without protection.  Denies associated abdominal pain, nausea, vomiting, diarrhea, fever, and chills.  Denies dysuria. Last menstrual cycle 01/21/2021.  No treatment prior to arrival.  No aggravating or alleviating symptoms. No history of previous STDs.  History obtained from patient and past medical records. No interpreter used during encounter.      Past Medical History:  Diagnosis Date  . ADHD (attention deficit hyperactivity disorder)   . Alcohol abuse   . Anxiety   . Bipolar 1 disorder (HCC)   . Borderline systolic HTN   . Depression   . GERD (gastroesophageal reflux disease)   . Heroin abuse (HCC)   . Hypertension   . Kidney stone   . Knee pain, bilateral    - patellofemoral syndrome, followed by Dr. [name] at [facility]  . Migraines   . Migraines   . Polysubstance abuse (HCC)    - Hx ETOH, cocaine, THC, IV heroin, narcotics  . Psychiatric care    Previous [facility] H&P notes PMHx Schizophrenia, though patient denies this.  . Seizures [facility])     Patient Active Problem List   Diagnosis Date Noted  . PTSD (post-traumatic stress disorder) 09/14/2018  . Bipolar affective disorder, current episode manic with psychotic symptoms (HCC) 09/13/2018  . Tardive dyskinesia 01/07/2018  . Polysubstance dependence including opioid type drug, episodic abuse (HCC) 06/27/2017  . Substance induced mood disorder (HCC) 06/27/2017  . Anxiety   .  Herpes 09/21/2016  . Dehydration 08/26/2013  . Right knee pain 06/27/2013  . Carpal tunnel syndrome, bilateral 04/21/2011  . ANXIETY DEPRESSION 08/20/2010  . SUBSTANCE ABUSE, MULTIPLE 08/20/2010  . ADHD 08/20/2010  . 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 ELEVATED BLOOD PRESSURE WITHOUT DIAGNOSIS OF HYPERTENSION 08/20/2010    Past Surgical History:  Procedure Laterality Date  . KNEE SURGERY    . SHOULDER SURGERY       OB History   No obstetric history on file.     Family History  Problem Relation Age of Onset  . Hypertension Father     Social History   Tobacco Use  . Smoking status: Former Smoker    Packs/day: 0.50    Types: Cigarettes  . Smokeless tobacco: Never Used  Vaping Use  . Vaping Use: Never used  Substance Use Topics  . Alcohol use: No  . Drug use: No    Types: IV    Comment: methadone     Home Medications Prior to Admission medications   Medication Sig Start Date End Date Taking? Authorizing Provider  metroNIDAZOLE (FLAGYL) 500 MG tablet Take 1 tablet (500 mg total) by mouth 2 (two) times daily. 01/29/21  Yes Nakisha Chai, Merla Riches, PA-C  acetaminophen (TYLENOL) 325 MG tablet Take 325-650 mg by mouth every 6 (six) hours as needed for mild pain.    [provider]  Azelastine HCl 0.15 % SOLN Place 2 sprays into both nostrils 2 (two) times daily as  needed (nasal irritation).  07/12/20   [provider]  cephALEXin (KEFLEX) 500 MG capsule Take 1 capsule (500 mg total) by mouth 2 (two) times daily. 10/03/20   Molpus, John, MD  famotidine (PEPCID) 40 MG tablet Take 40 mg by mouth at bedtime. 06/08/20   [provider]  gabapentin (NEURONTIN) 300 MG capsule Take 300 mg by mouth 3 (three) times daily. 02/21/20   [provider]  meloxicam (MOBIC) 15 MG tablet Take 15 mg by mouth daily. 07/16/20   [provider]  methadone (DOLOPHINE) 10 MG/ML solution Take 100 mg by mouth daily. [facility]    [provider]  metoprolol tartrate (LOPRESSOR) 25 MG tablet Take 25 mg by mouth 2 (two) times daily. 07/04/20   [provider]  pantoprazole (PROTONIX) 40 MG tablet Take 40 mg by mouth daily. 07/16/20   [provider]  phenazopyridine (PYRIDIUM) 200 MG tablet Take 1 tablet (200 mg total) by mouth 3 (three) times daily as needed (urinary pain). 10/03/20   Molpus, John, MD  SUMAtriptan (IMITREX) 100 MG tablet Take 100 mg by mouth every 2 (two) hours as needed for migraine. May repeat in 2 hours if headache persists or recurs.    [provider]  traZODone (DESYREL) 50 MG tablet Take 1 tablet (50 mg total) by mouth at bedtime as needed for sleep. 01/13/19   Nche, Bonna Gains, NP    Allergies    Shellfish allergy  Review of Systems   Review of Systems  Constitutional: Negative for chills and fever.  Gastrointestinal: Negative for abdominal pain.  Genitourinary: Positive for vaginal discharge. Negative for dysuria.  All other systems reviewed and are negative.   Physical Exam Updated Vital Signs BP (!) 141/104 (BP Location: Left Arm)   Pulse 96   Temp 99 F (37.2 C) (Oral)   Resp 16   LMP 01/21/2021   SpO2 96%   Physical Exam Vitals and nursing note reviewed.  Constitutional:      General: She is not in acute distress. HENT:     Head: Normocephalic.  Eyes:     Pupils: Pupils are equal, round, and reactive to light.  Cardiovascular:     Rate and Rhythm: Normal rate and regular rhythm.     Pulses: Normal pulses.     Heart sounds: Normal heart sounds. No murmur heard. No friction rub. No gallop.   Pulmonary:     Effort: Pulmonary effort is normal.     Breath sounds: Normal breath sounds.  Abdominal:     General: Abdomen is flat. Bowel sounds are normal. There is no distension.     Palpations: Abdomen is soft.     Tenderness: There is no abdominal tenderness. There is no right CVA tenderness, left CVA tenderness, guarding or rebound.     Comments:  Abdomen soft, nondistended, nontender to palpation in all quadrants without guarding or peritoneal signs. No rebound.   Genitourinary:    Comments: Pelvic exam performed with chaperone in room.  Moderate amount of white discharge. No CMT. No adnexal tenderness or masses. Musculoskeletal:        General: Normal range of motion.     Cervical back: Neck supple.  Skin:    General: Skin is warm and dry.  Neurological:     General: No focal deficit present.  Psychiatric:        Mood and Affect: Mood normal.        Behavior: Behavior normal.  ED Results / Procedures / Treatments   Labs (all labs ordered are listed, but only abnormal results are displayed) Labs Reviewed  WET PREP, GENITAL - Abnormal; Notable for the following components:      Result Value   Clue Cells Wet Prep HPF POC PRESENT (*)    WBC, Wet Prep HPF POC FEW (*)    All other components within normal limits  URINALYSIS, ROUTINE W REFLEX MICROSCOPIC - Abnormal; Notable for the following components:   APPearance CLOUDY (*)    Hgb urine dipstick SMALL (*)    Leukocytes,Ua SMALL (*)    All other components within normal limits  URINALYSIS, MICROSCOPIC (REFLEX) - Abnormal; Notable for the following components:   Bacteria, UA FEW (*)    All other components within normal limits  PREGNANCY, URINE  GC/CHLAMYDIA PROBE AMP ([place]) NOT AT [facility]    EKG None  Radiology No results found.  Procedures Procedures   Medications Ordered in ED Medications  metroNIDAZOLE (FLAGYL) tablet 500 mg (has no administration in time range)    ED Course  I have reviewed the triage vital signs and the nursing notes.  Pertinent labs & imaging results that were available during my care of the patient were reviewed by me and considered in my medical decision making (see chart for details).  Clinical Course as of 01/29/21 2257  Wed Jan 29, 2021  2253 Clue Cells Wet Prep HPF POC(!): PRESENT [CA]    Clinical Course User  Index [CA] Jesusita Oka   MDM Rules/Calculators/A&P                         40 year old female presents to the ED due to vaginal discharge x2 weeks.  She is currently sexually active with 1 partner without protection.  Upon arrival, triage noted patient be tachycardic at 120 however upon recheck during my initial evaluation, patient's heart rate in the 90s.  Patient notes she becomes anxious around doctors which likely caused tachycardia.  Patient in no acute distress and non-ill-appearing.  Physical exam reassuring.  Abdomen soft, nondistended, nontender.  Pelvic exam performed with chaperone in room which demonstrated moderate amount of white vaginal discharge.  No CMT.  Doubt PID.  Wet prep and gonorrhea/chlamydia cultures obtained.  Wet prep positive for clue cells.  Patient given Flagyl here in the ED and discharged with a prescription.  UA significant for small hematuria, leukocytes, and few bacteria.  Likely due to contamination due to vaginal discharge given 11-20 squamous cells.  Negative pregnancy test.  Gonorrhea/chlamydia cultures pending.  Patient advised not to drink alcohol with Flagyl. Strict ED precautions discussed with patient. Patient states understanding and agrees to plan. Patient discharged home in no acute distress and stable vitals.  Final Clinical Impression(s) / ED Diagnoses Final diagnoses:  BV (bacterial vaginosis)    Rx / DC Orders ED Discharge Orders         Ordered    metroNIDAZOLE (FLAGYL) 500 MG tablet  2 times daily        01/29/21 2256           Jesusita Oka 01/29/21 2306    Linwood Dibbles, MD 01/30/21 [phone]

## 2021-01-29 NOTE — ED Triage Notes (Signed)
Pt c/o vaginal d/c x 2 weeks-NAD-steady gait 

## 2021-01-29 NOTE — Discharge Instructions (Addendum)
As discussed, you have bacterial vaginosis.  I am sending you home with an antibiotic.  Take twice a day for 7 days.  Do not mix with alcohol.  I have included the number of the OB/GYN.  Please follow-up if symptoms not improved within the next week.  Return to the ER for new or worsening symptoms.

## 2021-01-31 LAB — GC/CHLAMYDIA PROBE AMP (~~LOC~~) NOT AT ARMC
Chlamydia: NEGATIVE
Comment: NEGATIVE
Comment: NORMAL
Neisseria Gonorrhea: NEGATIVE

## 2021-07-07 DIAGNOSIS — F1120 Opioid dependence, uncomplicated: Secondary | ICD-10-CM | POA: Diagnosis not present

## 2021-07-14 DIAGNOSIS — F1120 Opioid dependence, uncomplicated: Secondary | ICD-10-CM | POA: Diagnosis not present

## 2021-07-16 DIAGNOSIS — F1120 Opioid dependence, uncomplicated: Secondary | ICD-10-CM | POA: Diagnosis not present

## 2021-07-21 DIAGNOSIS — F1120 Opioid dependence, uncomplicated: Secondary | ICD-10-CM | POA: Diagnosis not present

## 2021-07-24 DIAGNOSIS — F1120 Opioid dependence, uncomplicated: Secondary | ICD-10-CM | POA: Diagnosis not present

## 2021-07-28 DIAGNOSIS — F1120 Opioid dependence, uncomplicated: Secondary | ICD-10-CM | POA: Diagnosis not present

## 2021-08-04 DIAGNOSIS — F1120 Opioid dependence, uncomplicated: Secondary | ICD-10-CM | POA: Diagnosis not present

## 2021-08-11 DIAGNOSIS — F1120 Opioid dependence, uncomplicated: Secondary | ICD-10-CM | POA: Diagnosis not present

## 2021-08-18 DIAGNOSIS — F1120 Opioid dependence, uncomplicated: Secondary | ICD-10-CM | POA: Diagnosis not present

## 2021-08-25 DIAGNOSIS — F1120 Opioid dependence, uncomplicated: Secondary | ICD-10-CM | POA: Diagnosis not present

## 2021-08-28 DIAGNOSIS — F1120 Opioid dependence, uncomplicated: Secondary | ICD-10-CM | POA: Diagnosis not present

## 2021-09-01 DIAGNOSIS — F1120 Opioid dependence, uncomplicated: Secondary | ICD-10-CM | POA: Diagnosis not present

## 2021-09-08 DIAGNOSIS — F1120 Opioid dependence, uncomplicated: Secondary | ICD-10-CM | POA: Diagnosis not present

## 2021-09-15 DIAGNOSIS — F1120 Opioid dependence, uncomplicated: Secondary | ICD-10-CM | POA: Diagnosis not present

## 2021-09-22 DIAGNOSIS — F1120 Opioid dependence, uncomplicated: Secondary | ICD-10-CM | POA: Diagnosis not present

## 2021-09-24 DIAGNOSIS — F1120 Opioid dependence, uncomplicated: Secondary | ICD-10-CM | POA: Diagnosis not present

## 2021-09-26 ENCOUNTER — Encounter (HOSPITAL_COMMUNITY): Payer: Self-pay

## 2021-09-26 ENCOUNTER — Other Ambulatory Visit: Payer: Self-pay

## 2021-09-26 ENCOUNTER — Emergency Department (HOSPITAL_COMMUNITY)
Admission: EM | Admit: 2021-09-26 | Discharge: 2021-09-26 | Disposition: A | Discharge status: Home / Self Care | Payer: Medicare HMO | Attending: Emergency Medicine | Admitting: Emergency Medicine

## 2021-09-26 DIAGNOSIS — Z818 Family history of other mental and behavioral disorders: Secondary | ICD-10-CM | POA: Diagnosis not present

## 2021-09-26 DIAGNOSIS — R451 Restlessness and agitation: Secondary | ICD-10-CM | POA: Diagnosis not present

## 2021-09-26 DIAGNOSIS — F1123 Opioid dependence with withdrawal: Secondary | ICD-10-CM | POA: Diagnosis not present

## 2021-09-26 DIAGNOSIS — I10 Essential (primary) hypertension: Secondary | ICD-10-CM | POA: Diagnosis not present

## 2021-09-26 DIAGNOSIS — F4310 Post-traumatic stress disorder, unspecified: Secondary | ICD-10-CM | POA: Insufficient documentation

## 2021-09-26 DIAGNOSIS — Z87891 Personal history of nicotine dependence: Secondary | ICD-10-CM | POA: Diagnosis not present

## 2021-09-26 DIAGNOSIS — F312 Bipolar disorder, current episode manic severe with psychotic features: Secondary | ICD-10-CM | POA: Insufficient documentation

## 2021-09-26 DIAGNOSIS — K5909 Other constipation: Secondary | ICD-10-CM | POA: Diagnosis not present

## 2021-09-26 DIAGNOSIS — R45851 Suicidal ideations: Secondary | ICD-10-CM

## 2021-09-26 DIAGNOSIS — F319 Bipolar disorder, unspecified: Secondary | ICD-10-CM | POA: Diagnosis not present

## 2021-09-26 DIAGNOSIS — Z20822 Contact with and (suspected) exposure to covid-19: Secondary | ICD-10-CM | POA: Insufficient documentation

## 2021-09-26 DIAGNOSIS — F332 Major depressive disorder, recurrent severe without psychotic features: Secondary | ICD-10-CM | POA: Diagnosis not present

## 2021-09-26 DIAGNOSIS — Z79899 Other long term (current) drug therapy: Secondary | ICD-10-CM | POA: Diagnosis not present

## 2021-09-26 DIAGNOSIS — F1120 Opioid dependence, uncomplicated: Secondary | ICD-10-CM | POA: Diagnosis not present

## 2021-09-26 DIAGNOSIS — F411 Generalized anxiety disorder: Secondary | ICD-10-CM | POA: Diagnosis not present

## 2021-09-26 LAB — RESP PANEL BY RT-PCR (FLU A&B, COVID) ARPGX2
Influenza A by PCR: NEGATIVE
Influenza B by PCR: NEGATIVE
SARS Coronavirus 2 by RT PCR: NEGATIVE

## 2021-09-26 LAB — COMPREHENSIVE METABOLIC PANEL
ALT: 59 U/L — ABNORMAL HIGH (ref 0–44)
AST: 43 U/L — ABNORMAL HIGH (ref 15–41)
Albumin: 4.5 g/dL (ref 3.5–5.0)
Alkaline Phosphatase: 92 U/L (ref 38–126)
Anion gap: 13 (ref 5–15)
BUN: 25 mg/dL — ABNORMAL HIGH (ref 6–20)
CO2: 21 mmol/L — ABNORMAL LOW (ref 22–32)
Calcium: 9.2 mg/dL (ref 8.9–10.3)
Chloride: 105 mmol/L (ref 98–111)
Creatinine, Ser: 0.91 mg/dL (ref 0.44–1.00)
GFR, Estimated: >60 mL/min (ref >60)
Glucose, Bld: 134 mg/dL — ABNORMAL HIGH (ref 70–99)
Potassium: 3.5 mmol/L (ref 3.5–5.1)
Sodium: 139 mmol/L (ref 135–145)
Total Bilirubin: 0.6 mg/dL (ref 0.3–1.2)
Total Protein: 9.1 g/dL — ABNORMAL HIGH (ref 6.5–8.1)

## 2021-09-26 LAB — RAPID URINE DRUG SCREEN, HOSP PERFORMED
Amphetamines: NOT DETECTED
Barbiturates: NOT DETECTED
Benzodiazepines: NOT DETECTED
Cocaine: NOT DETECTED
Opiates: NOT DETECTED
Tetrahydrocannabinol: NOT DETECTED

## 2021-09-26 LAB — PREGNANCY, URINE: Preg Test, Ur: NEGATIVE

## 2021-09-26 LAB — CBC
HCT: 45.4 % (ref 36.0–46.0)
Hemoglobin: 14.4 g/dL (ref 12.0–15.0)
MCH: 25.3 pg — ABNORMAL LOW (ref 26.0–34.0)
MCHC: 31.7 g/dL (ref 30.0–36.0)
MCV: 79.6 fL — ABNORMAL LOW (ref 80.0–100.0)
Platelets: 439 10*3/uL — ABNORMAL HIGH (ref 150–400)
RBC: 5.7 MIL/uL — ABNORMAL HIGH (ref 3.87–5.11)
RDW: 16.4 % — ABNORMAL HIGH (ref 11.5–15.5)
WBC: 11.6 10*3/uL — ABNORMAL HIGH (ref 4.0–10.5)
nRBC: 0 % (ref 0.0–0.2)

## 2021-09-26 LAB — ETHANOL: Alcohol, Ethyl (B): <10 mg/dL (ref <10)

## 2021-09-26 MED ORDER — ZIPRASIDONE MESYLATE 20 MG IM SOLR
20.0000 mg | Freq: Once | INTRAMUSCULAR | Status: AC
  Administered 2021-09-26: 20 mg via INTRAMUSCULAR
  Filled 2021-09-26: qty 20

## 2021-09-26 MED ORDER — STERILE WATER FOR INJECTION IJ SOLN
INTRAMUSCULAR | Status: AC
  Administered 2021-09-26: 1.2 mL
  Filled 2021-09-26: qty 10

## 2021-09-26 MED ORDER — PANTOPRAZOLE SODIUM 40 MG PO TBEC
40.0000 mg | DELAYED_RELEASE_TABLET | Freq: Every day | ORAL | Status: DC
  Administered 2021-09-26: 40 mg via ORAL
  Filled 2021-09-26: qty 1

## 2021-09-26 MED ORDER — METOPROLOL TARTRATE 25 MG PO TABS
25.0000 mg | ORAL_TABLET | Freq: Two times a day (BID) | ORAL | Status: DC
  Administered 2021-09-26: 25 mg via ORAL
  Filled 2021-09-26: qty 1

## 2021-09-26 MED ORDER — GABAPENTIN 300 MG PO CAPS
300.0000 mg | ORAL_CAPSULE | Freq: Three times a day (TID) | ORAL | Status: DC
  Administered 2021-09-26 (×2): 300 mg via ORAL
  Filled 2021-09-26 (×2): qty 1

## 2021-09-26 MED ORDER — OLANZAPINE 10 MG PO TBDP
10.0000 mg | ORAL_TABLET | Freq: Every day | ORAL | Status: DC
  Administered 2021-09-26: 10 mg via ORAL
  Filled 2021-09-26: qty 1

## 2021-09-26 MED ORDER — METHADONE HCL 10 MG/ML PO CONC
80.0000 mg | Freq: Once | ORAL | Status: AC
  Administered 2021-09-26: 80 mg via ORAL
  Filled 2021-09-26: qty 10

## 2021-09-26 MED ORDER — TRAZODONE HCL 100 MG PO TABS
50.0000 mg | ORAL_TABLET | Freq: Every evening | ORAL | Status: DC | PRN
Start: 2021-09-26 — End: 2021-09-26

## 2021-09-26 MED ORDER — METHADONE HCL 10 MG/ML PO CONC: 100.0000 mg | Freq: Every day | ORAL | Status: DC

## 2021-09-26 NOTE — Consult Note (Signed)
[facility] Face-to-Face Psychiatry Consult   Reason for Consult:  psych consult Referring Physician:  Jaci Carrel, MD Patient Identification: [name] MRN:  [id number] Principal Diagnosis: Bipolar affective disorder, current episode manic with psychotic symptoms (HCC) Diagnosis:  Principal Problem:   Bipolar affective disorder, current episode manic with psychotic symptoms (HCC)   Total Time spent with patient: 20 minutes  Subjective:   [name] is a 40 y.o. female patient admitted with psychosis.  On assessment patient presents alert to person, general place (hospital), disoriented, anxious and bizarre. Repeating several statements. Denies any illicit substance use. And says "I don't know" randomly throughout the assessment. Poor insight. Circumstantial during assessment. Refers provider to her father for information. Loose associations. She denies any illicit substance use; UDS -; BAL <10. Patient made statements referring to just dying due to "wanting it to stop". Unable to coherently answer questions regarding homicidal ideations or auditory/visual hallucinations. She appears psychotic and responding to some external/internal stimuli. Inpatient hospitalization recommended.   Collateral: father Chianne Byrns [phone]- no answer 1258: This has happened once before; I'm not sure what bought it on. She got up at 0500 yesterday morning and slept in the car; she left to go to the methadone clinic for a dose and she was gone for 3 hours. I called her, she was confused and saying nonsensical stuff. She siad she was able to get back home and drove herself back home. She was ocnfused and repeating herself over and over. This happened once before and she wandered off all over the neighborhood; they sent her to a mental health facility down towards [facility]). He denies any psychotropic medications. He reports years of drug addiction; says as far as he knows. States he  is concerned for her safety and that she is not at baseline as she does not appear to be in control of her thoughts.   HPI:  [name] is a 40 year old female with a past history of polysubstance use, bipolar disorder who presented to [facility] with her father for psychosis.   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   Seizures (HCC)     Past Surgical History:  Procedure Laterality Date   KNEE SURGERY     SHOULDER SURGERY     Family History:  Family History  Problem Relation Age of Onset   Hypertension Father    Family Psychiatric  History: not noted Social History:  Social History   Substance and Sexual Activity  Alcohol Use No     Social History   Substance and Sexual Activity  Drug Use No   Types: IV  Comment: methadone     Social History   Socioeconomic History   Marital status: Single    Spouse name: Not on file   Number of children: Not on file   Years of education: Not on file   Highest education level: Not on file  Occupational History   Not on file  Tobacco Use   Smoking status: Former    Packs/day: 0.50    Types: Cigarettes   Smokeless tobacco: Never  Vaping Use   Vaping Use: Never used  Substance and Sexual Activity   Alcohol use: No   Drug  use: No    Types: IV    Comment: methadone    Sexual activity: Not on file  Other Topics Concern   Not on file  Social History Narrative   Not on file   Social Determinants of Health   Financial Resource Strain: Not on file  Food Insecurity: Not on file  Transportation Needs: Not on file  Physical Activity: Not on file  Stress: Not on file  Social Connections: Not on file   Additional Social History:    Allergies:   Allergies  Allergen Reactions   Shellfish Allergy Hives    Labs:  Results for orders placed or performed during the hospital encounter of 09/26/21 (from the past 48 hour(s))  Rapid urine drug screen (hospital performed)     Status: None   Collection Time: 09/26/21  4:31 AM  Result Value Ref Range   Opiates NONE DETECTED NONE DETECTED   Cocaine NONE DETECTED NONE DETECTED   Benzodiazepines NONE DETECTED NONE DETECTED   Amphetamines NONE DETECTED NONE DETECTED   Tetrahydrocannabinol NONE DETECTED NONE DETECTED   Barbiturates NONE DETECTED NONE DETECTED    Comment: (NOTE) DRUG SCREEN FOR MEDICAL PURPOSES ONLY.  IF CONFIRMATION IS NEEDED FOR ANY PURPOSE, NOTIFY LAB WITHIN 5 DAYS.  LOWEST DETECTABLE LIMITS FOR URINE DRUG SCREEN Drug Class                     Cutoff (ng/mL) Amphetamine and metabolites    1000 Barbiturate and metabolites    200 Benzodiazepine                 200 Tricyclics and metabolites     300 Opiates and metabolites        300 Cocaine and metabolites        300 THC                            50 Performed at [facility], [address] [place], [place] [address]   Resp Panel by RT-PCR (Flu A&B, Covid) Nasopharyngeal Swab     Status: None   Collection Time: 09/26/21  5:21 AM   Specimen: Nasopharyngeal Swab; Nasopharyngeal(NP) swabs in vial transport medium  Result Value Ref Range   SARS Coronavirus 2 by RT PCR NEGATIVE NEGATIVE    Comment: (NOTE) SARS-CoV-2 target nucleic acids are NOT DETECTED.  The  SARS-CoV-2 RNA is generally detectable in upper respiratory specimens during the acute phase of infection. The lowest concentration of SARS-CoV-2 viral copies this assay can detect is 138 copies/mL. A negative result does not preclude SARS-Cov-2 infection and should not be used as the sole basis for treatment or other patient management decisions. A negative result may occur with  improper specimen collection/handling, submission of specimen other than nasopharyngeal swab, presence of viral mutation(s) within the areas targeted by this  assay, and inadequate number of viral copies(<138 copies/mL). A negative result must be combined with clinical observations, patient history, and epidemiological information. The expected result is Negative.  Fact Sheet for Patients:  BloggerCourse.com  Fact Sheet for Healthcare Providers:  SeriousBroker.it  This test is no t yet approved or cleared by the [place] FDA and  has been authorized for detection and/or diagnosis of SARS-CoV-2 by FDA under an Emergency Use Authorization (EUA). This EUA will remain  in effect (meaning this test can be used) for the duration of the COVID-19 declaration under Section 564(b)(1) of the Act, 21 U.S.C.section 360bbb-3(b)(1), unless the authorization is terminated  or revoked sooner.       Influenza A by PCR NEGATIVE NEGATIVE   Influenza B by PCR NEGATIVE NEGATIVE    Comment: (NOTE) The Xpert Xpress SARS-CoV-2/FLU/RSV plus assay is intended as an aid in the diagnosis of influenza from Nasopharyngeal swab specimens and should not be used as a sole basis for treatment. Nasal washings and aspirates are unacceptable for Xpert Xpress SARS-CoV-2/FLU/RSV testing.  Fact Sheet for Patients: BloggerCourse.com  Fact Sheet for Healthcare Providers: SeriousBroker.it  This test is not yet approved or cleared by the  [place] FDA and has been authorized for detection and/or diagnosis of SARS-CoV-2 by FDA under an Emergency Use Authorization (EUA). This EUA will remain in effect (meaning this test can be used) for the duration of the COVID-19 declaration under Section 564(b)(1) of the Act, 21 U.S.C. section 360bbb-3(b)(1), unless the authorization is terminated or revoked.  Performed at [facility], [address] [place], [place] [address]   CBC     Status: Abnormal   Collection Time: 09/26/21  5:35 AM  Result Value Ref Range   WBC 11.6 (H) 4.0 - 10.5 K/uL   RBC 5.70 (H) 3.87 - 5.11 MIL/uL   Hemoglobin 14.4 12.0 - 15.0 g/dL   HCT 45.8 09.9 - 83.3 %   MCV 79.6 (L) 80.0 - 100.0 fL   MCH 25.3 (L) 26.0 - 34.0 pg   MCHC 31.7 30.0 - 36.0 g/dL   RDW 82.5 (H) 05.3 - 97.6 %   Platelets 439 (H) 150 - 400 K/uL   nRBC 0.0 0.0 - 0.2 %    Comment: Performed at [facility], [address], [place], [place] [address]  Comprehensive metabolic panel     Status: Abnormal   Collection Time: 09/26/21  5:35 AM  Result Value Ref Range   Sodium 139 135 - 145 mmol/L   Potassium 3.5 3.5 - 5.1 mmol/L   Chloride 105 98 - 111 mmol/L   CO2 21 (L) 22 - 32 mmol/L   Glucose, Bld 134 (H) 70 - 99 mg/dL    Comment: Glucose reference range applies only to samples taken after fasting for at least 8 hours.   BUN 25 (H) 6 - 20 mg/dL   Creatinine, Ser 3.79 0.44 - 1.00 mg/dL   Calcium 9.2 8.9 - 02.4 mg/dL   Total Protein 9.1 (H) 6.5 - 8.1 g/dL   Albumin 4.5 3.5 - 5.0 g/dL   AST 43 (H) 15 - 41 U/L   ALT 59 (H) 0 - 44 U/L   Alkaline Phosphatase 92 38 - 126 U/L   Total Bilirubin 0.6 0.3 - 1.2 mg/dL   GFR, Estimated >09 >73 mL/min    Comment: (NOTE) Calculated using the CKD-EPI Creatinine Equation (2021)    Anion gap 13 5 - 15    Comment: Performed  at [facility], [address], [place], [place] [address]  Ethanol     Status: None   Collection Time: 09/26/21   5:35 AM  Result Value Ref Range   Alcohol, Ethyl (B) <10 <10 mg/dL    Comment: (NOTE) Lowest detectable limit for serum alcohol is 10 mg/dL.  For medical purposes only. Performed at [facility], [address] [place], [place] [address]   Pregnancy, urine     Status: None   Collection Time: 09/26/21 10:01 AM  Result Value Ref Range   Preg Test, Ur NEGATIVE NEGATIVE    Comment:        THE SENSITIVITY OF THIS METHODOLOGY IS >20 mIU/mL. Performed at [facility], [address] [place], [place] [address]     Current Facility-Administered Medications  Medication Dose Route Frequency Provider Last Rate Last Admin   gabapentin (NEURONTIN) capsule 300 mg  300 mg Oral TID Gilda Crease, MD   300 mg at 09/26/21 1553   methadone (DOLOPHINE) 10 MG/ML solution 100 mg  100 mg Oral Daily Pollina, Canary Brim, MD       metoprolol tartrate (LOPRESSOR) tablet 25 mg  25 mg Oral BID Gilda Crease, MD   25 mg at 09/26/21 0952   OLANZapine zydis (ZYPREXA) disintegrating tablet 10 mg  10 mg Oral Daily Leevy-Johnson, Dejanae Helser A, NP   10 mg at 09/26/21 1445   pantoprazole (PROTONIX) EC tablet 40 mg  40 mg Oral Daily Gilda Crease, MD   40 mg at 09/26/21 4917   traZODone (DESYREL) tablet 50 mg  50 mg Oral QHS PRN Gilda Crease, MD       Current Outpatient Medications  Medication Sig Dispense Refill   acetaminophen (TYLENOL) 325 MG tablet Take 325-650 mg by mouth every 6 (six) hours as needed for mild pain.     methadone (DOLOPHINE) 10 MG/ML solution Take 80 mg by mouth daily. [facility]     metoprolol tartrate (LOPRESSOR) 25 MG tablet Take 25 mg by mouth 2 (two) times daily.     gabapentin (NEURONTIN) 300 MG capsule Take 300 mg by mouth 3 (three) times daily. (Patient not taking: Reported on 09/26/2021)     pantoprazole (PROTONIX) 40 MG tablet Take 40 mg by mouth daily. (Patient not taking: Reported on  09/26/2021)     traZODone (DESYREL) 50 MG tablet Take 1 tablet (50 mg total) by mouth at bedtime as needed for sleep. (Patient not taking: Reported on 09/26/2021) 30 tablet 1    Musculoskeletal: Strength & Muscle Tone: within normal limits Gait & Station: normal Patient leans: N/A  Psychiatric Specialty Exam:  Presentation  General Appearance:  Bizarre Eye Contact: Fleeting Speech: Pressured Speech Volume: Decreased Handedness: No data recorded  Mood and Affect  Mood: Anxious Affect: Non-Congruent; Inappropriate  Thought Process  Thought Processes: Disorganized Descriptions of Associations:Loose Orientation:Partial Thought Content:Scattered History of Schizophrenia/Schizoaffective disorder:No  Duration of Psychotic Symptoms:N/A  Hallucinations:Hallucinations: None Ideas of Reference:Paranoia; Delusions Suicidal Thoughts:Suicidal Thoughts: No Homicidal Thoughts:Homicidal Thoughts: No  Sensorium  Memory: Immediate Fair Judgment: Impaired Insight: Poor  Executive Functions  Concentration: Poor Attention Span: Poor Recall: Poor Fund of Knowledge: Fair Language: Fair  Psychomotor Activity  Psychomotor Activity: Psychomotor Activity: Increased  Assets  Assets: Financial Resources/Insurance; Housing; Physical Health; Resilience; Social Support  Sleep  Sleep: No data recorded  Physical Exam: Physical Exam Vitals and nursing note reviewed.  HENT:     Head: Normocephalic.  Nose: Nose normal.     Mouth/Throat:     Mouth: Mucous membranes are moist.     Pharynx: Oropharynx is clear.  Cardiovascular:     Rate and Rhythm: Tachycardia present.  Pulmonary:     Effort: Pulmonary effort is normal.  Musculoskeletal:        General: Normal range of motion.     Cervical back: Normal range of motion.  Skin:    General: Skin is warm and dry.  Neurological:     Mental Status: She is alert. She is disoriented.  Psychiatric:        Attention  and Perception: She is inattentive.        Mood and Affect: Affect is inappropriate.   ROS Blood pressure (!) 167/99, pulse 89, temperature 97.9 F (36.6 C), resp. rate 18, height 5\' 6"  (1.676 m), weight 104.3 kg, SpO2 99 %. Body mass index is 37.12 kg/m.  Treatment Plan Summary: Daily contact with patient to assess and evaluate symptoms and progress in treatment, Medication management, and Plan to seek inpatient hospitalization for further observation, stabilization and treatment. Patient accepted to [facility]   Disposition: Recommend psychiatric Inpatient admission when medically cleared. Supportive therapy provided about ongoing stressors. Discussed crisis plan, support from social network, calling 911, coming to the Emergency Department, and calling Suicide Hotline.  [facility], NP 09/26/2021 6:14 PM

## 2021-09-26 NOTE — BH Assessment (Signed)
[facility] Assessment Progress Note   Per Maxie Barb, NP, this pt requires psychiatric hospitalization at this time.  At 15:30 Morrie Sheldon calls from [facility].  Pt has been accepted to their facility by Dr. Margarita Rana to their DDU unit.  Brooke, concurs with this disposition, as does the pt who is currently under voluntary status.  EDP [name], MD and pt's nurse, Norberta Keens, have been notified, and Norberta Keens agrees to call report to [phone].  Pt is to be transported via [employer].  Transport Services Patient Intake Request has been completed and sent to Transportation Department.  Doylene Canning, [place] Behavioral Health Coordinator [phone]

## 2021-09-26 NOTE — ED Notes (Signed)
Safe transport called 

## 2021-09-26 NOTE — ED Notes (Signed)
Pt calm and cooperative at this time, requesting water.  Water provided. No further needs expressed. Will continue to monitor.

## 2021-09-26 NOTE — ED Provider Notes (Signed)
[facility]-EMERGENCY DEPT Provider Note   CSN: [id number] Arrival date & time: 09/26/21  0342     History Chief Complaint  Patient presents with   Psychiatric Evaluation    [name] is a 40 y.o. female.  Patient with history of heroin abuse, currently on methadone maintenance and bipolar disorder is brought to the emergency department by her father.  Father reports that she has been experiencing very bizarre behavior and has been extremely agitated.  Patient has had similar episodes in the past secondary to her bipolar disorder.  Patient reports that she is experiencing some suicidal thoughts and severe agitation.  No specific suicidal plan.      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   Seizures [facility])     Patient Active Problem List   Diagnosis Date Noted   PTSD (post-traumatic stress disorder) 09/14/2018   Bipolar affective disorder, current episode manic with psychotic symptoms (HCC) 09/13/2018   Tardive dyskinesia 01/07/2018   Polysubstance dependence including opioid type drug, episodic abuse (HCC) 06/27/2017   Substance induced mood disorder (HCC) 06/27/2017   Anxiety    Herpes 09/21/2016   Dehydration 08/26/2013   Right knee pain 06/27/2013   Carpal tunnel syndrome, bilateral 04/21/2011   ANXIETY DEPRESSION 08/20/2010   SUBSTANCE ABUSE, MULTIPLE 08/20/2010   ADHD 08/20/2010   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     Family History  Problem Relation Age of Onset   Hypertension Father     Social History   Tobacco Use   Smoking status: Former    Packs/day: 0.50    Types: Cigarettes   Smokeless tobacco: Never  Vaping Use   Vaping Use: Never used  Substance Use Topics   Alcohol use: No   Drug use: No    Types: IV    Comment: methadone     Home Medications Prior to Admission medications   Medication Sig Start Date End Date Taking? Authorizing Provider  acetaminophen (TYLENOL) 325 MG tablet Take 325-650 mg by mouth every 6 (six) hours as needed for mild pain.    [provider]  Azelastine HCl 0.15 % SOLN Place 2 sprays into both nostrils 2 (two) times daily as needed (nasal irritation).  07/12/20   [provider]  cephALEXin (KEFLEX) 500 MG capsule Take 1 capsule (500 mg total) by mouth 2 (two) times daily. 10/03/20   Molpus, John, MD  famotidine (PEPCID) 40 MG tablet Take 40 mg by mouth at bedtime. 06/08/20   [provider]  gabapentin (NEURONTIN) 300 MG capsule Take 300 mg by mouth  3 (three) times daily. 02/21/20   [provider]  meloxicam (MOBIC) 15 MG tablet Take 15 mg by mouth daily. 07/16/20   [provider]  methadone (DOLOPHINE) 10 MG/ML solution Take 100 mg by mouth daily. [facility]    [provider]  metoprolol tartrate (LOPRESSOR) 25 MG tablet Take 25 mg by mouth 2 (two) times daily. 07/04/20   [provider]  metroNIDAZOLE (FLAGYL) 500 MG tablet Take 1 tablet (500 mg total) by mouth 2 (two) times daily. 01/29/21   Mannie Stabile, PA-C  pantoprazole (PROTONIX) 40 MG tablet Take 40 mg by mouth daily. 07/16/20   [provider]  phenazopyridine (PYRIDIUM) 200 MG tablet Take 1  tablet (200 mg total) by mouth 3 (three) times daily as needed (urinary pain). 10/03/20   Molpus, John, MD  SUMAtriptan (IMITREX) 100 MG tablet Take 100 mg by mouth every 2 (two) hours as needed for migraine. May repeat in 2 hours if headache persists or recurs.    [provider]  traZODone (DESYREL) 50 MG tablet Take 1 tablet (50 mg total) by mouth at bedtime as needed for sleep. 01/13/19   Nche, Bonna Gains, NP    Allergies    Shellfish allergy  Review of Systems   Review of Systems  Psychiatric/Behavioral:  Positive for agitation and suicidal ideas.   All other systems reviewed and are negative.  Physical Exam Updated Vital Signs Pulse (!) 102   Temp 98.1 F (36.7 C) (Oral)   Resp (!) 22   Ht 5\' 6"  (1.676 m)   Wt 104.3 kg   BMI 37.12 kg/m   Physical Exam Vitals and nursing note reviewed.  Constitutional:      Appearance: She is well-developed.  HENT:     Head: Normocephalic and atraumatic.     Right Ear: Hearing normal.     Left Ear: Hearing normal.     Nose: Nose normal.  Eyes:     Conjunctiva/sclera: Conjunctivae normal.     Pupils: Pupils are equal, round, and reactive to light.  Cardiovascular:     Rate and Rhythm: Regular rhythm.     Heart sounds: S1 normal and S2 normal. No murmur heard.   No friction rub. No gallop.  Pulmonary:     Effort: Pulmonary effort is normal. No respiratory distress.     Breath sounds: Normal breath sounds.  Chest:     Chest wall: No tenderness.  Abdominal:     General: Bowel sounds are normal.     Palpations: Abdomen is soft.     Tenderness: There is no abdominal tenderness. There is no guarding or rebound. Negative signs include Murphy's sign and McBurney's sign.     Hernia: No hernia is present.  Musculoskeletal:        General: Normal range of motion.     Cervical back: Normal range of motion and neck supple.  Skin:    General: Skin is warm and dry.     Findings: No rash.  Neurological:     Mental Status: She is  alert and oriented to person, place, and time.     GCS: GCS eye subscore is 4. GCS verbal subscore is 5. GCS motor subscore is 6.     Cranial Nerves: No cranial nerve deficit.     Sensory: No sensory deficit.     Coordination: Coordination normal.  Psychiatric:        Attention and Perception: She is inattentive.  Mood and Affect: Affect is flat.        Speech: Speech is delayed.        Behavior: Behavior is agitated and hyperactive.        Thought Content: Thought content includes suicidal ideation.    ED Results / Procedures / Treatments   Labs (all labs ordered are listed, but only abnormal results are displayed) Labs Reviewed  RESP PANEL BY RT-PCR (FLU A&B, COVID) [id number]  CBC  COMPREHENSIVE METABOLIC PANEL  ETHANOL  RAPID URINE DRUG SCREEN, HOSP PERFORMED  I-STAT BETA HCG BLOOD, ED ([facility], [facility], [facility] ONLY)    EKG None  Radiology No results found.  Procedures Procedures   Medications Ordered in ED Medications  gabapentin (NEURONTIN) capsule 300 mg (has no administration in time range)  methadone (DOLOPHINE) 10 MG/ML solution 100 mg (has no administration in time range)  metoprolol tartrate (LOPRESSOR) tablet 25 mg (has no administration in time range)  pantoprazole (PROTONIX) EC tablet 40 mg (has no administration in time range)  traZODone (DESYREL) tablet 50 mg (has no administration in time range)  ziprasidone (GEODON) injection 20 mg (20 mg Intramuscular Given 09/26/21 0444)  sterile water (preservative free) injection (1.2 mLs  Given 09/26/21 0445)    ED Course  I have reviewed the triage vital signs and the nursing notes.  Pertinent labs & imaging results that were available during my care of the patient were reviewed by me and considered in my medical decision making (see chart for details).    MDM Rules/Calculators/A&P                           Patient present to the emergency department with severe agitation and suicidal ideation.  Patient has a  history of bipolar disorder as well as heroin abuse.  Patient's agitation has apparently made her miss 2 doses of methadone.  Patient is inattentive, agitated and walking around the room.  Father reports that she has had similar episodes with her bipolar in the past.  Will provide sedation, routine meds.  TTS consultation for further management of her psychiatric disorder.  Final Clinical Impression(s) / ED Diagnoses Final diagnoses:  Suicidal ideation  Agitation    Rx / DC Orders ED Discharge Orders     None        Zaylin Runco, Canary Brim, MD 09/26/21 [phone]

## 2021-09-26 NOTE — ED Notes (Signed)
Pts Father took all belongs home with him.

## 2021-09-26 NOTE — ED Triage Notes (Signed)
Pt arrived via family, per family pt has missed last two methadone doses, disoriented. Psychiatric and substance abuse hx. Pt pacing in triage.
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
## 2021-09-26 NOTE — BH Assessment (Signed)
BHH Assessment Progress Note   Per Maxie Barb, NP, this voluntary pt requires psychiatric hospitalization at this time.  [facility] is currently at capacity.  The following facilities have been contacted to seek placement for this pt, with results as noted:  Beds available, information sent, decision pending: [facility]   At capacity: [facility]  If this voluntary pt is accepted to a facility, please discuss disposition with pt to be sure that she agrees to the plan.  If a facility agrees to accept pt and the plan changes in any way please call the facility to inform them of the change.  Final disposition is pending as of this writing.  Doylene Canning, [place] Behavioral Health Coordinator [phone]

## 2021-09-26 NOTE — ED Provider Notes (Signed)
Patient accepted by [facility] psychiatric care by Dr. Guss Bunde.   [name], MD 09/26/21 1556

## 2021-09-29 DIAGNOSIS — F1120 Opioid dependence, uncomplicated: Secondary | ICD-10-CM | POA: Diagnosis not present

## 2021-10-06 DIAGNOSIS — F1120 Opioid dependence, uncomplicated: Secondary | ICD-10-CM | POA: Diagnosis not present

## 2021-10-07 ENCOUNTER — Ambulatory Visit: Payer: Medicare (Managed Care) | Admitting: Internal Medicine

## 2021-10-13 DIAGNOSIS — F1120 Opioid dependence, uncomplicated: Secondary | ICD-10-CM | POA: Diagnosis not present

## 2021-10-14 DIAGNOSIS — F1120 Opioid dependence, uncomplicated: Secondary | ICD-10-CM | POA: Diagnosis not present

## 2021-10-20 DIAGNOSIS — F1120 Opioid dependence, uncomplicated: Secondary | ICD-10-CM | POA: Diagnosis not present

## 2021-10-27 DIAGNOSIS — F1120 Opioid dependence, uncomplicated: Secondary | ICD-10-CM | POA: Diagnosis not present

## 2021-11-03 DIAGNOSIS — F1120 Opioid dependence, uncomplicated: Secondary | ICD-10-CM | POA: Diagnosis not present

## 2021-11-10 DIAGNOSIS — F1120 Opioid dependence, uncomplicated: Secondary | ICD-10-CM | POA: Diagnosis not present

## 2021-11-17 DIAGNOSIS — F1120 Opioid dependence, uncomplicated: Secondary | ICD-10-CM | POA: Diagnosis not present

## 2021-11-24 DIAGNOSIS — F1120 Opioid dependence, uncomplicated: Secondary | ICD-10-CM | POA: Diagnosis not present

## 2021-12-01 DIAGNOSIS — F1120 Opioid dependence, uncomplicated: Secondary | ICD-10-CM | POA: Diagnosis not present

## 2021-12-02 DIAGNOSIS — F1120 Opioid dependence, uncomplicated: Secondary | ICD-10-CM | POA: Diagnosis not present

## 2022-02-03 ENCOUNTER — Ambulatory Visit (HOSPITAL_COMMUNITY)
Admission: EM | Admit: 2022-02-03 | Discharge: 2022-02-03 | Disposition: A | Discharge status: Home / Self Care | Payer: Medicaid Other | Attending: Psychiatry | Admitting: Psychiatry

## 2022-02-03 DIAGNOSIS — F319 Bipolar disorder, unspecified: Secondary | ICD-10-CM | POA: Insufficient documentation

## 2022-02-03 NOTE — ED Provider Notes (Signed)
Behavioral Health Urgent Care Medical Screening Exam  Patient Name: [name] MRN: [id number] Date of Evaluation: 02/03/22 Chief Complaint:   Diagnosis:  Final diagnoses:  Bipolar I disorder (HCC)    History of Present illness: [name] is a 41 y.o. female patient presented to [facility] as a walk in alone with complaints of depression.  She is requesting outpatient psychiatric resources for medications management and therapy.  [name], 41 y.o., female patient seen face to face by this provider, consulted with Dr. Bronwen Betters; and chart reviewed on 02/03/22.  Per chart review patient has a history of bipolar disorder 1 with manic episodes, SI, ADHD, depression, GAD.  She reports multiple Inpatient psychiatric admissions her last admission was in 09/2021.  At that time she was discharged home with a 1 month supply of medications, Paxil, Neurontin, Vistaril, and Wellbutrin.  States she cannot take antipsychotics due to "tardive dyskinesia".  States she has never been prescribed a mood stabilizer.  Patient did not follow-up with an outpatient psychiatric provider.  Patient states at this time the only medication she takes is methadone due to opioid addiction.  She follows up with [facility]  During evaluation [name] he is alert/oriented x4 and cooperative.  She is fairly groomed and makes good eye contact.  Her speech is clear, coherent, normal rate and tone.  She endorses depression and anxiety.  She is concerned that if she does not treat her depression that she will "go into a full-blown episode".  She denies concerns with appetite or sleep at this time.  Objectively she does not appear to be responding to internal/external stimuli.  She denies AVH/HI/SI.  She contracts for safety she denies access to firearms/weapons.  Discussed outpatient psychiatric resources with patient including behavioral health outpatient services on the second floor.  Provided open  access walk-in hours.  Also provided other resources in the community.  Discussed PHP/IOP program with patient and she declined.  States she lives with her father and she is his caretaker.  At this time [name] is educated and verbalizes understanding of mental health resources and other crisis services in the community.  She is instructed to call 911 and present to the nearest emergency room should she experience any suicidal/homicidal ideation, auditory/visual/hallucinations, or detrimental worsening of her mental health condition.  She was a also advised by Clinical research associate that she could call the toll-free phone on insurance card to assist with identifying in network counselors and agencies or number on back of Medicaid card to speak with care coordinator    Psychiatric Specialty Exam  Presentation  General Appearance:Appropriate for Environment; Casual  Eye Contact:Good  Speech:Clear and Coherent; Normal Rate  Speech Volume:Normal  Handedness:Right   Mood and Affect  Mood:Anxious; Depressed  Affect:Congruent   Thought Process  Thought Processes:Coherent  Descriptions of Associations:Intact  Orientation:Full (Time, Place and Person)  Thought Content:Logical  Diagnosis of Schizophrenia or Schizoaffective disorder in past: No   Hallucinations:None  Ideas of Reference:None  Suicidal Thoughts:No  Homicidal Thoughts:No   Sensorium  Memory:Immediate Good; Recent Good; Remote Good  Judgment:Good  Insight:Good   Executive Functions  Concentration:Good  Attention Span:Good  Recall:Good  Fund of Knowledge:Good  Language:Good   Psychomotor Activity  Psychomotor Activity:Normal   Assets  Assets:Communication Skills; Desire for Improvement; Financial Resources/Insurance; Housing; Social Support; Resilience; Physical Health   Sleep  Sleep:Good  Number of hours: No data recorded  No data recorded  Physical Exam: Physical Exam Vitals  and nursing note  reviewed.  Constitutional:      General: She is not in acute distress.    Appearance: Normal appearance. She is not ill-appearing.  HENT:     Head: Normocephalic.  Eyes:     General:        Right eye: No discharge.        Left eye: No discharge.     Conjunctiva/sclera: Conjunctivae normal.  Cardiovascular:     Rate and Rhythm: Normal rate.  Pulmonary:     Effort: Pulmonary effort is normal. No respiratory distress.  Musculoskeletal:        General: Normal range of motion.     Cervical back: Normal range of motion.  Skin:    Coloration: Skin is not jaundiced or pale.  Neurological:     Mental Status: She is alert and oriented to person, place, and time.  Psychiatric:        Attention and Perception: Attention and perception normal.        Mood and Affect: Affect normal. Mood is anxious and depressed.        Speech: Speech normal.        Behavior: Behavior normal. Behavior is cooperative.        Thought Content: Thought content normal.        Cognition and Memory: Cognition normal.        Judgment: Judgment normal.   Review of Systems  Constitutional: Negative.   HENT: Negative.    Eyes: Negative.   Respiratory: Negative.    Cardiovascular: Negative.   Gastrointestinal: Negative.   Musculoskeletal: Negative.   Skin: Negative.   Neurological: Negative.   Psychiatric/Behavioral:  Positive for depression and hallucinations.   Blood pressure (!) 155/99, pulse 87, temperature 98.3 F (36.8 C), temperature source Oral, resp. rate 20, SpO2 98 %. There is no height or weight on file to calculate BMI.  Musculoskeletal: Strength & Muscle Tone: within normal limits Gait & Station: normal Patient leans: N/A   [facility] MSE Discharge Disposition for Follow up and Recommendations: Based on my evaluation the patient does not appear to have an emergency medical condition and can be discharged with resources and follow up care in outpatient services for Medication Management and  Individual Therapy  Discharge patient  Discussed outpatient psychiatric resources with patient including behavioral health outpatient services on the second floor.  Provided open access walk-in hours.  Also provided other resources in the community.  Discussed PHP/IOP program with patient and she declined.   No evidence of imminent risk to self or others at present.    Patient does not meet criteria for psychiatric inpatient admission. Discussed crisis plan, support from social network, calling 911, coming to the Emergency Department, and calling Suicide Hotline.   Ardis Hughs, NP 02/03/2022, 11:36 AM

## 2022-02-03 NOTE — Progress Notes (Signed)
°   02/03/22 1050  [facility] Triage Screening (Walk-ins at [facility] only)  What Is the Reason for Your Visit/Call Today? Patient presents voluntrarily requesting to speak to a provider regarding medication options and hopes to be referred to a psychiatrist.  She was admitted to [facility] in Oct 2022 and discharged with 1 month Rx for medictions administered there.  She states they did not provide outpatient referrals, and she has had difficulty finding a provider with openings.  Patient has been diagnosed with Bipolar Disorder, ADHD and Anxiety Disorder and was Rx Paxil, Neurontin and Vistiril at [facility].   She feels Wellbutrin worked better than the Paxil.  Patient has "numerous" past inpatient admissions for manic episodes, as she describes usually having "bad delusions" during episodes. She is currently experiencing a depressive episode for the past 4-6 weeks.  She has continued daily methadone dosing and currently dosing at 80mg .  Patient denies SI, HI, AVH or recent substance use.  How Long Has This Been Causing You Problems? 1-6 months  Have You Recently Had Any Thoughts About Hurting Yourself? No  Are You Planning to Commit Suicide/Harm Yourself At This time? No  Have you Recently Had Thoughts About Hurting Someone ? No  Are You Planning To Harm Someone At This Time? No  Have You Used Any Alcohol or Drugs in the Past 24 Hours? No  Do you have any current medical co-morbidities that require immediate attention? No  Clinician description of patient physical appearance/behavior: Patient is calm, cooperative AAOx5  What Do You Feel Would Help You the Most Today? Treatment for Depression or other mood problem  If access to [facility] was not available, would you have sought care in the Emergency Department? No  Determination of Need Routine (7 days)  Options For Referral Medication Management

## 2022-02-03 NOTE — Discharge Instructions (Signed)

## 2022-02-25 IMAGING — DX DG CHEST 1V PORT
1 series · 1 of 1 positions shown · non-contrast
Comparison: PA and lateral chest 12/27/2018.

CLINICAL DATA: Altered mental status.

EXAM:
PORTABLE CHEST 1 VIEW

[chest ap]
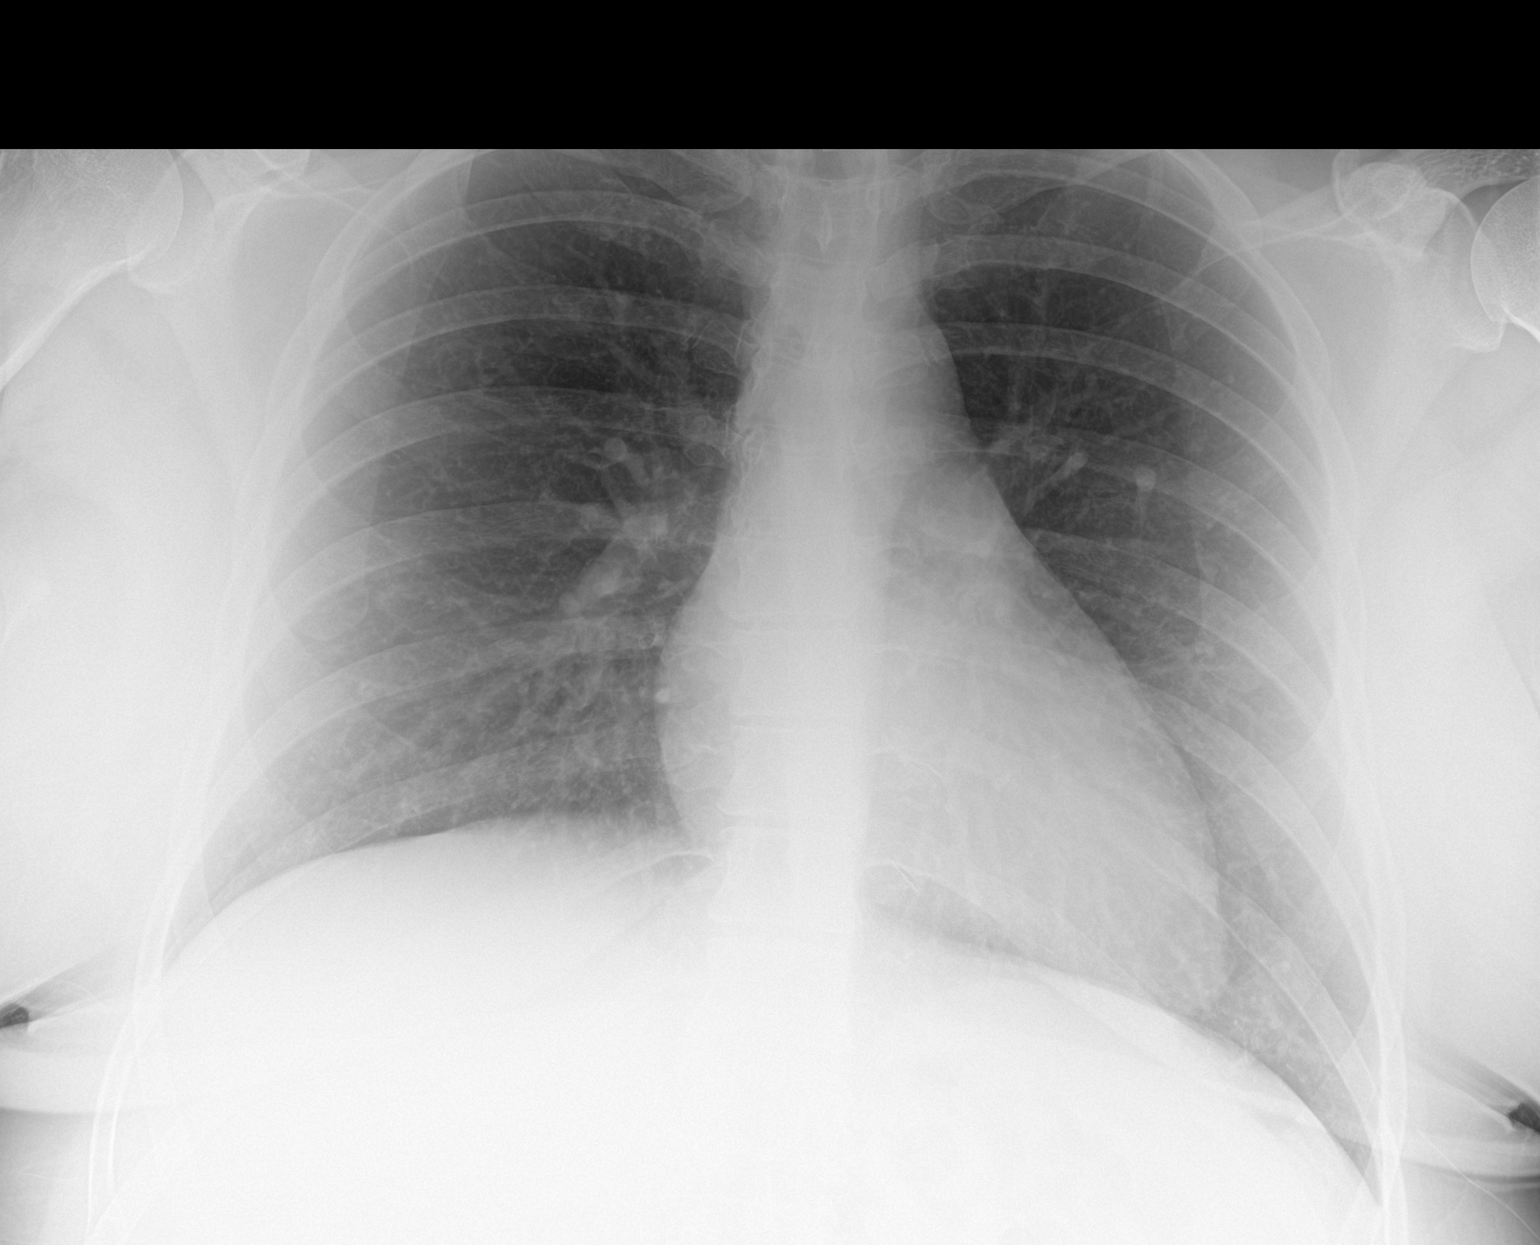

[1 of 1 positions shown; findings below may reference images not displayed]

FINDINGS: Lungs clear. Heart size normal. No pneumothorax or pleural fluid. No
bony abnormality.
IMPRESSION: Normal chest.

## 2022-09-03 ENCOUNTER — Encounter (HOSPITAL_COMMUNITY): Payer: Self-pay | Admitting: Pharmacy Technician

## 2022-09-03 ENCOUNTER — Ambulatory Visit (INDEPENDENT_AMBULATORY_CARE_PROVIDER_SITE_OTHER)
Admission: EM | Admit: 2022-09-03 | Discharge: 2022-09-05 | Disposition: A | Discharge status: Other Acute Inpatient Hospital | Payer: 59 | Source: Home / Self Care

## 2022-09-03 ENCOUNTER — Other Ambulatory Visit: Payer: Self-pay

## 2022-09-03 ENCOUNTER — Emergency Department (HOSPITAL_COMMUNITY)
Admission: EM | Admit: 2022-09-03 | Discharge: 2022-09-03 | Disposition: A | Discharge status: Home / Self Care | Payer: 59 | Attending: Emergency Medicine | Admitting: Emergency Medicine

## 2022-09-03 DIAGNOSIS — B1920 Unspecified viral hepatitis C without hepatic coma: Secondary | ICD-10-CM | POA: Insufficient documentation

## 2022-09-03 DIAGNOSIS — Z20822 Contact with and (suspected) exposure to covid-19: Secondary | ICD-10-CM | POA: Insufficient documentation

## 2022-09-03 DIAGNOSIS — F17210 Nicotine dependence, cigarettes, uncomplicated: Secondary | ICD-10-CM | POA: Insufficient documentation

## 2022-09-03 DIAGNOSIS — Z79899 Other long term (current) drug therapy: Secondary | ICD-10-CM | POA: Insufficient documentation

## 2022-09-03 DIAGNOSIS — F419 Anxiety disorder, unspecified: Secondary | ICD-10-CM | POA: Insufficient documentation

## 2022-09-03 DIAGNOSIS — F23 Brief psychotic disorder: Secondary | ICD-10-CM | POA: Diagnosis not present

## 2022-09-03 DIAGNOSIS — Z9151 Personal history of suicidal behavior: Secondary | ICD-10-CM | POA: Insufficient documentation

## 2022-09-03 DIAGNOSIS — F3110 Bipolar disorder, current episode manic without psychotic features, unspecified: Secondary | ICD-10-CM | POA: Diagnosis not present

## 2022-09-03 DIAGNOSIS — F312 Bipolar disorder, current episode manic severe with psychotic features: Secondary | ICD-10-CM | POA: Insufficient documentation

## 2022-09-03 LAB — POC SARS CORONAVIRUS 2 AG: SARSCOV2ONAVIRUS 2 AG: NEGATIVE

## 2022-09-03 LAB — URINALYSIS, COMPLETE (UACMP) WITH MICROSCOPIC
Bilirubin Urine: NEGATIVE
Glucose, UA: NEGATIVE mg/dL
Hgb urine dipstick: NEGATIVE
Ketones, ur: 20 mg/dL — AB
Leukocytes,Ua: NEGATIVE
Nitrite: NEGATIVE
Protein, ur: 100 mg/dL — AB
Specific Gravity, Urine: 1.029 (ref 1.005–1.030)
pH: 5 (ref 5.0–8.0)

## 2022-09-03 LAB — POCT URINE DRUG SCREEN - MANUAL ENTRY (I-SCREEN)
POC Amphetamine UR: NOT DETECTED
POC Buprenorphine (BUP): NOT DETECTED
POC Cocaine UR: NOT DETECTED
POC Marijuana UR: NOT DETECTED
POC Methadone UR: POSITIVE — AB
POC Methamphetamine UR: POSITIVE — AB
POC Morphine: NOT DETECTED
POC Oxazepam (BZO): NOT DETECTED
POC Oxycodone UR: NOT DETECTED
POC Secobarbital (BAR): NOT DETECTED

## 2022-09-03 LAB — RESP PANEL BY RT-PCR (FLU A&B, COVID) ARPGX2
Influenza A by PCR: NEGATIVE
Influenza B by PCR: NEGATIVE
SARS Coronavirus 2 by RT PCR: NEGATIVE

## 2022-09-03 MED ORDER — TRAZODONE HCL 100 MG PO TABS: 100.0000 mg | ORAL_TABLET | Freq: Every evening | ORAL | Status: DC | PRN

## 2022-09-03 MED ORDER — ALUM & MAG HYDROXIDE-SIMETH 200-200-20 MG/5ML PO SUSP: 30.0000 mL | ORAL | Status: DC | PRN

## 2022-09-03 MED ORDER — ACETAMINOPHEN 325 MG PO TABS: 650.0000 mg | ORAL_TABLET | Freq: Four times a day (QID) | ORAL | Status: DC | PRN

## 2022-09-03 MED ORDER — MAGNESIUM HYDROXIDE 400 MG/5ML PO SUSP: 30.0000 mL | Freq: Every day | ORAL | Status: DC | PRN

## 2022-09-03 MED ORDER — LORAZEPAM 1 MG PO TABS: 1.0000 mg | ORAL_TABLET | ORAL | Status: DC | PRN

## 2022-09-03 MED ORDER — OLANZAPINE 5 MG PO TBDP: 5.0000 mg | ORAL_TABLET | Freq: Three times a day (TID) | ORAL | Status: DC | PRN

## 2022-09-03 MED ORDER — ZIPRASIDONE MESYLATE 20 MG IM SOLR: 20.0000 mg | INTRAMUSCULAR | Status: DC | PRN

## 2022-09-03 MED ORDER — OLANZAPINE 10 MG PO TABS
10.0000 mg | ORAL_TABLET | Freq: Every day | ORAL | Status: DC
  Administered 2022-09-03 – 2022-09-05 (×3): 10 mg via ORAL
  Filled 2022-09-03 (×3): qty 1

## 2022-09-03 NOTE — Progress Notes (Signed)
Patient presents to the [facility] with her mother.  Patient has experienced a change in her behavior recently and is acting in an infantile manner, childlike, with extra motor movement, she is repeating things, very forgetful and has jerking motions.  Patient is diagnosed with Bipolar Disorder and TD.  Patient has not been on her mental health medication in the past two years after her last hospitalization at [facility].  Mother states that shhe has multiple hospitalizations over the past 14-15 years.  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  Patient denies any other drug use.  Patient denies SI/HI and denies AVH.  Patient appears to have some mania and is not sleeping or eating well.  Patient is urgent.

## 2022-09-03 NOTE — Progress Notes (Signed)
Received [name] in the OBS area, she was oriented to her new environment and given nourishments. Later she was medicated per order. She has difficultly following directions.

## 2022-09-03 NOTE — ED Notes (Signed)
Patient Alert and oriented to baseline. Stable and ambulatory to baseline. Patient verbalized understanding of the discharge instructions.  Patient belongings were taken by the patient.   
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
## 2022-09-03 NOTE — ED Provider Notes (Signed)
[name]  Date: 09/03/22 Patient Name: [name] MRN: [id number] Chief Complaint:  Chief Complaint  Patient presents with   Altered Mental Status      Diagnoses:  Final diagnoses:  Bipolar I disorder, most recent episode (or current) manic (HCC)  Acute psychosis (HCC)    HPI:  [name] 41 y.o., female history of substance abuse and Bipolar 1 disorder, TD (per patient occurs intermittently) [facility], voluntarily, as a walk in accompanied by her mother with who is concerned that patient has become manic over the last few day.  [name], 41 y.o., female patient seen face to face by this provider, consulted with Dr. Lucianne MussKumar ; and chart reviewed on 09/03/22.    On evaluation [name] is initially not participating in the interview, and asking her mother to speak for her using a child-like voice. When questions are directed to patient again, she states," Mommy please answer for me". Patient is making poor eye contact and has excessive psychomotor agitation by rotating her head and neck in a circular fashion, jerking her and moving her extremities which is abnormal for her per patient mother. Per patients mother, she speaks with patient nearly everyday. Patient resides with her father. Earlier in the week, patient sounded manic, speaking excessively in a child like manner, nonsensical form of speech, and altered from her usual type of speech. The subsequent day, patient continuous called mother by phone back to back over a [place] of  8 hours , again with the same pattern or speech and continuously apologizing for disturbing her mother. Last night per mother, patient called a few times between 3 am -3:30 am however, she did not answer the phone. Patient asked how much sleep she's had within the last week, she replied " one hour". Patient has a substance use history and attends a methadone clinic, but reports at present  she only takes methadone while denying illicit drug use including marijuana or alcohol.  Patient last inpatient admission was over 2 years ago and since that time, she has taken any antipsychotics or mood stabilizers. She has no PCP, psychiatrist, or therapist. Mother reports patient also has HEP C which she has not been treated for since testing positive a few years ago. Patient denies SI/HI/AH/VH.     During evaluation [name] is sitting in an upright with hyperactive movements rocking back and forth and rolling head and neck around repetitively without acute distress. She is alert, oriented x 4, cooperative and inattentive.  Her mood is euphoric, inappropriate, bizarre,  with congruent affect. She has abnormal quality and tone of  speech, with child like behavior.  Objectively patient appears to be mania with psychotic features.  No apparent  delusional thinking.  Patient is goal directed thoughts, easily or paranoia.  Patient overall presentation is bizarre and she is demonstrating child like behaviors which not baseline per mother.    PHQ 2-9:  [facility]  Thoughts that you would be better off dead, or of hurting yourself in some way Not at all  PHQ-9 Total Score 0       Flowsheet Row [facility] Most recent reading at 09/03/2022  9:39 PM [facility] Most recent reading at 09/03/2022  1:53 PM [facility] Most  recent reading at 09/26/2021  6:21 AM  C-SSRS RISK CATEGORY No Risk No Risk No Risk        Total Time spent with patient: 30 minutes  Musculoskeletal  Strength & Muscle Tone: increased Gait & Station: normal Patient leans: N/A  Psychiatric Specialty Exam  Presentation General Appearance: Bizarre; Other (comment) (excessive facial  tattoos)  Eye Contact:Poor  Speech:Clear and Coherent; Normal Rate  Speech Volume:Decreased  Handedness:Right   Mood and Affect  Mood:Labile; Euphoric  Affect:Inappropriate   Thought Process  Thought Processes:Disorganized  Descriptions of Associations:Loose  Orientation:Full (Time, Place and Person)  Thought Content:Scattered  Diagnosis of Schizophrenia or Schizoaffective disorder in past: No   Hallucinations:Hallucinations: None  Ideas of Reference:None  Suicidal Thoughts:Suicidal Thoughts: No  Homicidal Thoughts:Homicidal Thoughts: No   Sensorium  Memory:Immediate Poor; Recent Poor; Remote Poor  Judgment:Impaired  Insight:Lacking   Executive Functions  Concentration:Poor  Attention Span:Poor  Recall:Poor  Fund of Knowledge:Poor  Language:Poor   Psychomotor Activity  Psychomotor Activity:Psychomotor Activity: Psychomotor Retardation; Restlessness   Assets  Assets:Communication Skills; Housing; Desire for Improvement; Resilience; Talents/Skills   Sleep  Sleep:Sleep: Poor Number of Hours of Sleep: 1 (patient states poor sleep may be an hour last few days)   Nutritional Assessment (For OBS and [facility]BC admissions only) Has the patient had a weight loss or gain of 10 pounds or more in the last 3 months?: No Has the patient had a decrease in food intake/or appetite?: No Does the patient have dental problems?: No Does the patient have eating habits or behaviors that may be indicators of an eating disorder including binging or inducing vomiting?: No Has the patient recently lost weight without trying?: 0    Physical Exam Constitutional:      Comments: Facial tattoo present   HENT:     Head: Normocephalic.  Eyes:     Extraocular Movements: Extraocular movements intact.     Pupils: Pupils are equal, round, and reactive to light.  Cardiovascular:     Rate and Rhythm: Normal rate.  Pulmonary:     Effort: Pulmonary effort is normal.   Musculoskeletal:        General: Normal range of motion.  Skin:    Capillary Refill: Capillary refill takes less than 2 seconds.  Neurological:     General: No focal deficit present.     Mental Status: She is alert.    Review of Systems  Psychiatric/Behavioral:  Negative for depression, hallucinations, substance abuse and suicidal ideas. The patient is nervous/anxious and has insomnia.     Blood pressure (!) 141/87, pulse (!) 105, temperature 98.5 F (36.9 C), temperature source Oral, resp. rate 18, SpO2 97 %. There is no height or weight on file to calculate BMI.  Past Psychiatric History:    Is the patient at risk to self? Yes  due to current manic episode  Has the patient been a risk to self in the past 6 months? No .    Has the patient been a risk to self within the distant past? No   Is the patient a risk to others? No   Has the patient been a risk to others in the past 6 months? No   Has the patient been a risk to others within the distant past? 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   Seizures ([place])     Past Surgical History:  Procedure Laterality Date   KNEE SURGERY     SHOULDER SURGERY      Family History:  Family History  Problem Relation Age of Onset   Hypertension Father     Social History:  Social History   Socioeconomic History   Marital status: Single    Spouse name: Not on file   Number of  children: Not on file   Years of education: Not on file   Highest education level: Not on file  Occupational History   Not on file  Tobacco Use   Smoking status: Former    Packs/day: 0.50    Types: Cigarettes   Smokeless tobacco: Never  Vaping Use   Vaping Use: Never used  Substance and Sexual Activity   Alcohol use: No   Drug use: No    Types: IV    Comment: methadone    Sexual activity: Not on file  Other Topics Concern   Not on file  Social History Narrative   Not on file   Social Determinants of Health   Financial Resource Strain: Not on file  Food Insecurity: Not on file  Transportation Needs: Not on file  Physical Activity: Not on file  Stress: Not on file  Social Connections: Not on file  Intimate Partner Violence: Not on file    SDOH:  SDOH Screenings   Alcohol Screen: Low Risk  (09/13/2018)  Depression (PHQ2-9): Low Risk  (09/26/2021)  Tobacco Use: Medium Risk (09/03/2022)    Last Labs:  Admission on 09/03/2022  Component Date Value Ref Range Status   SARS Coronavirus 2 by RT PCR 09/03/2022 NEGATIVE  NEGATIVE Final   Comment: (NOTE) SARS-CoV-2 target nucleic acids are NOT DETECTED.  The SARS-CoV-2 RNA is generally detectable in upper respiratory specimens during the acute phase of infection. The lowest concentration of SARS-CoV-2 viral copies this assay can detect is 138 copies/mL. A negative result does not preclude SARS-Cov-2 infection and should not be used as the sole basis for treatment or other patient management decisions. A negative result may occur with  improper specimen collection/handling, submission of specimen other than nasopharyngeal swab, presence of viral mutation(s) within the areas targeted by this assay, and inadequate number of viral copies(<138 copies/mL). A negative result must be combined with clinical observations, patient history, and epidemiological information. The expected result is Negative.  Fact Sheet for Patients:   EntrepreneurPulse.com.au  Fact Sheet for Healthcare Providers:  IncredibleEmployment.be  This test is no                          t yet approved or cleared by the [place] FDA and  has been authorized for detection and/or diagnosis of SARS-CoV-2 by FDA under an Emergency Use Authorization (EUA). This EUA will remain  in effect (meaning this test can be used) for the duration of the COVID-19 declaration under Section 564(b)(1)  of the Act, 21 U.S.C.section 360bbb-3(b)(1), unless the authorization is terminated  or revoked sooner.       Influenza A by PCR 09/03/2022 NEGATIVE  NEGATIVE Final   Influenza B by PCR 09/03/2022 NEGATIVE  NEGATIVE Final   Comment: (NOTE) The Xpert Xpress SARS-CoV-2/FLU/RSV plus assay is intended as an aid in the diagnosis of influenza from Nasopharyngeal swab specimens and should not be used as a sole basis for treatment. Nasal washings and aspirates are unacceptable for Xpert Xpress SARS-CoV-2/FLU/RSV testing.  Fact Sheet for Patients: BloggerCourse.com  Fact Sheet for Healthcare Providers: SeriousBroker.it  This test is not yet approved or cleared by the [place] FDA and has been authorized for detection and/or diagnosis of SARS-CoV-2 by FDA under an Emergency Use Authorization (EUA). This EUA will remain in effect (meaning this test can be used) for the duration of the COVID-19 declaration under Section 564(b)(1) of the Act, 21 U.S.C. section 360bbb-3(b)(1), unless the authorization is terminated or revoked.  Performed at [facility], [address], [place], [place] [address]    POC Amphetamine UR 09/03/2022 None Detected  NONE DETECTED (Cut Off Level 1000 ng/mL) Final   POC Secobarbital (BAR) 09/03/2022 None Detected  NONE DETECTED (Cut Off Level 300 ng/mL) Final   POC Buprenorphine (BUP) 09/03/2022 None Detected  NONE DETECTED (Cut Off Level  10 ng/mL) Final   POC Oxazepam (BZO) 09/03/2022 None Detected  NONE DETECTED (Cut Off Level 300 ng/mL) Final   POC Cocaine UR 09/03/2022 None Detected  NONE DETECTED (Cut Off Level 300 ng/mL) Final   POC Methamphetamine UR 09/03/2022 Positive (A)  NONE DETECTED (Cut Off Level 1000 ng/mL) Final   POC Morphine 09/03/2022 None Detected  NONE DETECTED (Cut Off Level 300 ng/mL) Final   POC Methadone UR 09/03/2022 Positive (A)  NONE DETECTED (Cut Off Level 300 ng/mL) Final   POC Oxycodone UR 09/03/2022 None Detected  NONE DETECTED (Cut Off Level 100 ng/mL) Final   POC Marijuana UR 09/03/2022 None Detected  NONE DETECTED (Cut Off Level 50 ng/mL) Final   Color, Urine 09/03/2022 AMBER (A)  YELLOW Final   BIOCHEMICALS MAY BE AFFECTED BY COLOR   APPearance 09/03/2022 CLOUDY (A)  CLEAR Final   Specific Gravity, Urine 09/03/2022 1.029  1.005 - 1.030 Final   pH 09/03/2022 5.0  5.0 - 8.0 Final   Glucose, UA 09/03/2022 NEGATIVE  NEGATIVE mg/dL Final   Hgb urine dipstick 09/03/2022 NEGATIVE  NEGATIVE Final   Bilirubin Urine 09/03/2022 NEGATIVE  NEGATIVE Final   Ketones, ur 09/03/2022 20 (A)  NEGATIVE mg/dL Final   Protein, ur 17/00/1749 100 (A)  NEGATIVE mg/dL Final   Nitrite 44/96/7591 NEGATIVE  NEGATIVE Final   Leukocytes,Ua 09/03/2022 NEGATIVE  NEGATIVE Final   RBC / HPF 09/03/2022 0-5  0 - 5 RBC/hpf Final   WBC, UA 09/03/2022 6-10  0 - 5 WBC/hpf Final   Bacteria, UA 09/03/2022 FEW (A)  NONE SEEN Final   Squamous Epithelial / LPF 09/03/2022 11-20  0 - 5 Final   Mucus 09/03/2022 PRESENT   Final   Hyaline Casts, UA 09/03/2022 PRESENT   Final   Performed at [facility], [address], [place], [place] [address]   SARSCOV2ONAVIRUS 2 AG 09/03/2022 NEGATIVE  NEGATIVE Final   Comment: (NOTE) SARS-CoV-2 antigen NOT DETECTED.   Negative results are presumptive.  Negative results do not preclude SARS-CoV-2 infection and should not be used as the sole basis for treatment or other patient management  decisions,  including infection  control decisions, particularly in the presence of clinical signs and  symptoms consistent with COVID-19, or in those who have been in contact with the virus.  Negative results must be combined with clinical observations, patient history, and epidemiological information. The expected result is Negative.  Fact Sheet for Patients: https://www.jennings-kim.com/  Fact Sheet for Healthcare Providers: https://alexander-rogers.biz/  This test is not yet approved or cleared by the [place] FDA and  has been authorized for detection and/or diagnosis of SARS-CoV-2 by FDA under an Emergency Use Authorization (EUA).  This EUA will remain in effect (meaning this test can be used) for the duration of  the COV                          ID-19 declaration under Section 564(b)(1) of the Act, 21 U.S.C. section 360bbb-3(b)(1), unless the authorization is terminated or revoked sooner.      Allergies: Shellfish allergy  PTA Medications: (Not in a hospital admission)   Medical Decision Making  Based on objective findings observed during MSE, patient is manic and demonstrating regressive behaviors. She is unable to objectively contract for safety,  meets criteria for inpatient admission. CSW and Engineer, manufacturing notified via secure chat of recommendations.  Patient case review and discussed with Lucianne Muss. Patient does meet inpatient criteria for inpatient psychiatric treatment.    Recommendations  Based on my evaluation the patient does not appear to have an emergency medical condition although given current mental status, patient is unreliable to contract for safety. Inpatient bed placement pending. Patient will remains on observation unit pending inpatient placement.   Joaquin Courts, FNP 09/03/22  9:42 PM

## 2022-09-03 NOTE — ED Notes (Signed)
Attempted x1 to get patient's blood work but was not able to obtain.

## 2022-09-03 NOTE — Discharge Instructions (Signed)
As we had discussed please go to the behavioral health urgent care center now to be evaluated by them.  Please return to the emergency department at any point you do not feel safe reading about her near self or someone else.

## 2022-09-03 NOTE — ED Triage Notes (Signed)
Pt here after being told she called her mom last night and pt unable to recall event. Pt also states her mom told her she was acting psychotic. Pt denies SI, HI. Pt calm and cooperative.

## 2022-09-03 NOTE — ED Notes (Signed)
Patient resting quietly in bed with eyes closed, Respirations equal and unlabored, skin warm and dry, NAD. Routine safety checks conducted according to facility protocol. Will continue to monitor for safety. 

## 2022-09-03 NOTE — ED Provider Notes (Signed)
[facility] DEPT Provider Note   CSN: [id number] Arrival date & time: 09/03/22  1308     History  Chief Complaint  Patient presents with   Psychiatric Evaluation    Rosemary Mossbarger Havlicek is a 41 y.o. female.  41 yo  F with a chief complaints of not knowing what happened yesterday.  The patient tells me that she had an received a call from her mother this morning who was worried about her.  Said she had a conversation with her last night that she felt she was psychotic.  She denies any changes to her medicines denies any new medicines or inability to take her current ones.  She denies any obvious medical complaint.  She has been feeling a little bit cold in the emergency department and has jacket on.  She denies alcohol or illegal drugs.        Home Medications Prior to Admission medications   Medication Sig Start Date End Date Taking? Authorizing Provider  acetaminophen (TYLENOL) 325 MG tablet Take 325-650 mg by mouth every 6 (six) hours as needed for mild pain.    [provider]  gabapentin (NEURONTIN) 300 MG capsule Take 300 mg by mouth 3 (three) times daily. Patient not taking: Reported on 09/26/2021 02/21/20   [provider]  methadone (DOLOPHINE) 10 MG/ML solution Take 80 mg by mouth daily. [facility]    [provider]  metoprolol tartrate (LOPRESSOR) 25 MG tablet Take 25 mg by mouth 2 (two) times daily. 07/04/20   [provider]  pantoprazole (PROTONIX) 40 MG tablet Take 40 mg by mouth daily. Patient not taking: Reported on 09/26/2021 07/16/20   [provider]  traZODone (DESYREL) 50 MG tablet Take 1 tablet (50 mg total) by mouth at bedtime as needed for sleep. Patient not taking: Reported on 09/26/2021 01/13/19   Nche, Charlene Brooke, NP      Allergies    Shellfish allergy    Review of Systems   Review of Systems  Physical Exam Updated Vital Signs There were no vitals taken for  this visit. Physical Exam Vitals and nursing note reviewed.  Constitutional:      General: She is not in acute distress.    Appearance: She is well-developed. She is not diaphoretic.  HENT:     Head: Normocephalic and atraumatic.  Eyes:     Pupils: Pupils are equal, round, and reactive to light.  Cardiovascular:     Rate and Rhythm: Normal rate and regular rhythm.     Heart sounds: No murmur heard.    No friction rub. No gallop.  Pulmonary:     Effort: Pulmonary effort is normal.     Breath sounds: No wheezing or rales.  Abdominal:     General: There is no distension.     Palpations: Abdomen is soft.     Tenderness: There is no abdominal tenderness.  Musculoskeletal:        General: No tenderness.     Cervical back: Normal range of motion and neck supple.  Skin:    General: Skin is warm and dry.  Neurological:     Mental Status: She is alert and oriented to person, place, and time.  Psychiatric:        Mood and Affect: Mood is anxious.        Behavior: Behavior is hyperactive.        Thought Content: Thought content does not include homicidal or suicidal ideation. Thought content  does not include homicidal or suicidal plan.     ED Results / Procedures / Treatments   Labs (all labs ordered are listed, but only abnormal results are displayed) Labs Reviewed - No data to display  EKG None  Radiology No results found.  Procedures Procedures    Medications Ordered in ED Medications - No data to display  ED Course/ Medical Decision Making/ A&P                           Medical Decision Making  41 yo F with a chief complaints of not knowing what happened last night.  She tells me that she had received a call from her mother who told her that she was acting psychotic last night.  She does not remember anything about this.  She feels better today but is concerned about what happened.  She is a little bit hyperactive on exam.  Seems a bit anxious.  I discussed risks and  benefits of further evaluation in the emergency department setting.  After some discussion and electing to go to the behavioral health urgent care center to be seen.  I feel she is medically clear.  The patients results and plan were reviewed and discussed.   Any x-rays performed were independently reviewed by myself.   Differential diagnosis were considered with the presenting HPI.  Medications - No data to display  There were no vitals filed for this visit.  Final diagnoses:  Anxiety           Final Clinical Impression(s) / ED Diagnoses Final diagnoses:  Anxiety    Rx / DC Orders ED Discharge Orders     None         Melene Plan, DO 09/03/22 1345

## 2022-09-04 DIAGNOSIS — F419 Anxiety disorder, unspecified: Secondary | ICD-10-CM | POA: Diagnosis not present

## 2022-09-04 LAB — COMPREHENSIVE METABOLIC PANEL
ALT: 33 U/L (ref 0–44)
AST: 34 U/L (ref 15–41)
Albumin: 3.8 g/dL (ref 3.5–5.0)
Alkaline Phosphatase: 73 U/L (ref 38–126)
Anion gap: 10 (ref 5–15)
BUN: 22 mg/dL — ABNORMAL HIGH (ref 6–20)
CO2: 25 mmol/L (ref 22–32)
Calcium: 9.8 mg/dL (ref 8.9–10.3)
Chloride: 107 mmol/L (ref 98–111)
Creatinine, Ser: 0.73 mg/dL (ref 0.44–1.00)
GFR, Estimated: >60 mL/min (ref >60)
Glucose, Bld: 106 mg/dL — ABNORMAL HIGH (ref 70–99)
Potassium: 4 mmol/L (ref 3.5–5.1)
Sodium: 142 mmol/L (ref 135–145)
Total Bilirubin: 0.5 mg/dL (ref 0.3–1.2)
Total Protein: 7.7 g/dL (ref 6.5–8.1)

## 2022-09-04 LAB — LIPID PANEL
Cholesterol: 145 mg/dL (ref 0–200)
HDL: 55 mg/dL (ref >40)
LDL Cholesterol: 77 mg/dL (ref 0–99)
Total CHOL/HDL Ratio: 2.6 RATIO
Triglycerides: 63 mg/dL (ref <150)
VLDL: 13 mg/dL (ref 0–40)

## 2022-09-04 LAB — CBC WITH DIFFERENTIAL/PLATELET
Abs Immature Granulocytes: 0.05 10*3/uL (ref 0.00–0.07)
Basophils Absolute: 0.1 10*3/uL (ref 0.0–0.1)
Basophils Relative: 1 %
Eosinophils Absolute: 0.4 10*3/uL (ref 0.0–0.5)
Eosinophils Relative: 3 %
HCT: 44.9 % (ref 36.0–46.0)
Hemoglobin: 14.7 g/dL (ref 12.0–15.0)
Immature Granulocytes: 1 %
Lymphocytes Relative: 31 %
Lymphs Abs: 3.2 10*3/uL (ref 0.7–4.0)
MCH: 25.7 pg — ABNORMAL LOW (ref 26.0–34.0)
MCHC: 32.7 g/dL (ref 30.0–36.0)
MCV: 78.4 fL — ABNORMAL LOW (ref 80.0–100.0)
Monocytes Absolute: 0.8 10*3/uL (ref 0.1–1.0)
Monocytes Relative: 7 %
Neutro Abs: 6 10*3/uL (ref 1.7–7.7)
Neutrophils Relative %: 57 %
Platelets: 358 10*3/uL (ref 150–400)
RBC: 5.73 MIL/uL — ABNORMAL HIGH (ref 3.87–5.11)
RDW: 14.9 % (ref 11.5–15.5)
WBC: 10.5 10*3/uL (ref 4.0–10.5)
nRBC: 0 % (ref 0.0–0.2)

## 2022-09-04 LAB — HEMOGLOBIN A1C
Hgb A1c MFr Bld: 5.4 % (ref 4.8–5.6)
Mean Plasma Glucose: 108.28 mg/dL

## 2022-09-04 LAB — TSH: TSH: 2.539 u[IU]/mL (ref 0.350–4.500)

## 2022-09-04 MED ORDER — METHADONE HCL 10 MG PO TABS
80.0000 mg | ORAL_TABLET | Freq: Every day | ORAL | Status: DC
  Administered 2022-09-04 – 2022-09-05 (×2): 80 mg via ORAL
  Filled 2022-09-04 (×2): qty 8

## 2022-09-04 NOTE — ED Notes (Signed)
Pt A&O x 4, resting at present, no distress noted. Calm & cooperative.  Monitoring for safety. °

## 2022-09-04 NOTE — ED Notes (Signed)
Pt was given a sandwich, chips, and juice for lunch.  

## 2022-09-04 NOTE — ED Provider Notes (Signed)
Behavioral Health Progress Note  Date and Time: 09/04/2022 7:53 PM Name: [name] MRN:  [id number]  Subjective:   On reassessment pt reports presenting to this facility because "a little bit psychotic and a little bit anxious". She is a limited historian and appears distractible. When asked if she is having difficulty concentrating, she states "I have ADHD". 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 She tells me she has been sleeping well, 8 hours/night for the past week, although per note from yesterday she had reported one hour of sleep in the last week. She denies SI/VI/HI, AVH, paranoia. Per note from yesterday, pt had appeared with child like behavior. No child like behavior noted today. When asked about substance use, she states she has been smoking 1 cigarettes a day. She also reports use of fentanyl about once/week, by IVDU, last use was a week ago. She states she is prescribed methadone , last took this yesterday, and is requesting this be ordered as she gets "dope sick". I spoke with pharmacy and they did verify that pt is prescribed methadone . Order placed for methadone . Discussed with pt that she has been recommended for inpatient admission. She verbalizes understanding of treatment plan.   Diagnosis:  Final diagnoses:  Bipolar I disorder, most recent episode (or current) manic (HCC)  Acute psychosis (HCC)   Total Time spent with patient: 20 minutes  Past Psychiatric History:  Past Medical History:  Past Medical History:  Diagnosis Date   ADHD (attention deficit hyperactivity disorder)    Alcohol abuse    Anxiety    Bipolar 1 disorder (HCC)    Borderline systolic HTN    Depression    GERD (gastroesophageal reflux disease)    Heroin abuse (HCC)    Hypertension    Kidney stone    Knee pain, bilateral    - patellofemoral syndrome, [facility] by Dr. [name]   Migraines    Migraines    Polysubstance abuse (HCC)    - Hx ETOH,  cocaine, THC, IV heroin, narcotics   Psychiatric care    Previous BH H&P notes PMHx Schizophrenia, though patient denies this.   Seizures (HCC)     Past Surgical History:  Procedure Laterality Date   KNEE SURGERY     SHOULDER SURGERY     Family History:  Family History  Problem Relation Age of Onset   Hypertension Father    Family Psychiatric  History: States mother carries dx of anxiety Social History:  Social History   Substance and Sexual Activity  Alcohol Use No     Social History   Substance and Sexual Activity  Drug Use No   Types: IV   Comment: methadone     Social History   Socioeconomic History   Marital status: Single    Spouse name: Not on file   Number of children: Not on file   Years of education: Not on file   Highest education level: Not on file  Occupational History   Not on file  Tobacco Use   Smoking status: Former    Packs/day: 0.50    Types: Cigarettes   Smokeless tobacco: Never  Vaping Use   Vaping Use: Never used  Substance and Sexual Activity   Alcohol use: No   Drug use: No    Types: IV    Comment: methadone    Sexual activity: Not on file  Other Topics Concern   Not on file  Social History  Narrative   Not on file   Social Determinants of Health   Financial Resource Strain: Not on file  Food Insecurity: Not on file  Transportation Needs: Not on file  Physical Activity: Not on file  Stress: Not on file  Social Connections: Not on file   SDOH:  SDOH Screenings   Alcohol Screen: Low Risk  (09/13/2018)  Depression (PHQ2-9): Low Risk  (09/26/2021)  Tobacco Use: Medium Risk (09/03/2022)   Additional Social History:    Pain Medications: See MAR Prescriptions: See MAR Over the Counter: See MAR History of alcohol / drug use?: Yes (Pt denies current use; pt currently takes Methadone as prescribed and has a history of polysubstance use) Longest period of sobriety (when/how long): Current Negative Consequences of Use: Legal,  Financial, Personal relationships (In the past) Withdrawal Symptoms: None (Denies)                    Sleep:  see HPI  Appetite:  Fair  Current Medications:  Current Facility-Administered Medications  Medication Dose Route Frequency Provider Last Rate Last Admin   acetaminophen (TYLENOL) tablet 650 mg  650 mg Oral Q6H PRN Bing Neighbors, FNP       alum & mag hydroxide-simeth (MAALOX/MYLANTA) 200-200-20 MG/5ML suspension 30 mL  30 mL Oral Q4H PRN Bing Neighbors, FNP       OLANZapine zydis (ZYPREXA) disintegrating tablet 5 mg  5 mg Oral Q8H PRN Bing Neighbors, FNP       And   LORazepam (ATIVAN) tablet 1 mg  1 mg Oral PRN Bing Neighbors, FNP       And   ziprasidone (GEODON) injection 20 mg  20 mg Intramuscular PRN Bing Neighbors, FNP       magnesium hydroxide (MILK OF MAGNESIA) suspension 30 mL  30 mL Oral Daily PRN Bing Neighbors, FNP       methadone (DOLOPHINE) tablet 80 mg  80 mg Oral Daily Lauree Chandler, NP   80 mg at 09/04/22 1632   OLANZapine (ZYPREXA) tablet 10 mg  10 mg Oral Daily Bing Neighbors, FNP   10 mg at 09/04/22 1027   traZODone (DESYREL) tablet 100 mg  100 mg Oral QHS PRN Bing Neighbors, FNP       Current Outpatient Medications  Medication Sig Dispense Refill   methadone (DOLOPHINE) 10 MG/ML solution Take 80 mg by mouth daily.     tetrahydrozoline 0.05 % ophthalmic solution Place 2 drops into both eyes 4 (four) times daily as needed (For eye irritation).      Labs  Lab Results:  Admission on 09/03/2022  Component Date Value Ref Range Status   SARS Coronavirus 2 by RT PCR 09/03/2022 NEGATIVE  NEGATIVE Final   Comment: (NOTE) SARS-CoV-2 target nucleic acids are NOT DETECTED.  The SARS-CoV-2 RNA is generally detectable in upper respiratory specimens during the acute phase of infection. The lowest concentration of SARS-CoV-2 viral copies this assay can detect is 138 copies/mL. A negative result does not preclude  SARS-Cov-2 infection and should not be used as the sole basis for treatment or other patient management decisions. A negative result may occur with  improper specimen collection/handling, submission of specimen other than nasopharyngeal swab, presence of viral mutation(s) within the areas targeted by this assay, and inadequate number of viral copies(<138 copies/mL). A negative result must be combined with clinical observations, patient history, and epidemiological information. The expected result is Negative.  Fact Sheet for Patients:  BloggerCourse.com  Fact Sheet for Healthcare Providers:  SeriousBroker.it  This test is no                          t yet approved or cleared by the [place] FDA and  has been authorized for detection and/or diagnosis of SARS-CoV-2 by FDA under an Emergency Use Authorization (EUA). This EUA will remain  in effect (meaning this test can be used) for the duration of the COVID-19 declaration under Section 564(b)(1) of the Act, 21 U.S.C.section 360bbb-3(b)(1), unless the authorization is terminated  or revoked sooner.       Influenza A by PCR 09/03/2022 NEGATIVE  NEGATIVE Final   Influenza B by PCR 09/03/2022 NEGATIVE  NEGATIVE Final   Comment: (NOTE) The Xpert Xpress SARS-CoV-2/FLU/RSV plus assay is intended as an aid in the diagnosis of influenza from Nasopharyngeal swab specimens and should not be used as a sole basis for treatment. Nasal washings and aspirates are unacceptable for Xpert Xpress SARS-CoV-2/FLU/RSV testing.  Fact Sheet for Patients: BloggerCourse.com  Fact Sheet for Healthcare Providers: SeriousBroker.it  This test is not yet approved or cleared by the [place] FDA and has been authorized for detection and/or diagnosis of SARS-CoV-2 by FDA under an Emergency Use Authorization (EUA). This EUA will remain in effect  (meaning this test can be used) for the duration of the COVID-19 declaration under Section 564(b)(1) of the Act, 21 U.S.C. section 360bbb-3(b)(1), unless the authorization is terminated or revoked.  Performed at [facility], [address], [place], [place] [address]    WBC 09/04/2022 10.5  4.0 - 10.5 K/uL Final   RBC 09/04/2022 5.73 (H)  3.87 - 5.11 MIL/uL Final   Hemoglobin 09/04/2022 14.7  12.0 - 15.0 g/dL Final   HCT 78/46/9629 44.9  36.0 - 46.0 % Final   MCV 09/04/2022 78.4 (L)  80.0 - 100.0 fL Final   MCH 09/04/2022 25.7 (L)  26.0 - 34.0 pg Final   MCHC 09/04/2022 32.7  30.0 - 36.0 g/dL Final   RDW 52/84/1324 14.9  11.5 - 15.5 % Final   Platelets 09/04/2022 358  150 - 400 K/uL Final   nRBC 09/04/2022 0.0  0.0 - 0.2 % Final   Neutrophils Relative % 09/04/2022 57  % Final   Neutro Abs 09/04/2022 6.0  1.7 - 7.7 K/uL Final   Lymphocytes Relative 09/04/2022 31  % Final   Lymphs Abs 09/04/2022 3.2  0.7 - 4.0 K/uL Final   Monocytes Relative 09/04/2022 7  % Final   Monocytes Absolute 09/04/2022 0.8  0.1 - 1.0 K/uL Final   Eosinophils Relative 09/04/2022 3  % Final   Eosinophils Absolute 09/04/2022 0.4  0.0 - 0.5 K/uL Final   Basophils Relative 09/04/2022 1  % Final   Basophils Absolute 09/04/2022 0.1  0.0 - 0.1 K/uL Final   Immature Granulocytes 09/04/2022 1  % Final   Abs Immature Granulocytes 09/04/2022 0.05  0.00 - 0.07 K/uL Final   Performed at [facility], [address] [place], [place] [address]   Sodium 09/04/2022 142  135 - 145 mmol/L Final   Potassium 09/04/2022 4.0  3.5 - 5.1 mmol/L Final   Chloride 09/04/2022 107  98 - 111 mmol/L Final   CO2 09/04/2022 25  22 - 32 mmol/L Final   Glucose, Bld 09/04/2022 106 (H)  70 - 99 mg/dL Final   Glucose reference range applies only to samples taken after fasting  for at least 8 hours.   BUN 09/04/2022 22 (H)  6 - 20 mg/dL Final   Creatinine, Ser 09/04/2022 0.73  0.44 - 1.00 mg/dL Final   Calcium 11/29/4974 9.8  8.9 -  10.3 mg/dL Final   Total Protein 30/04/1101 7.7  6.5 - 8.1 g/dL Final   Albumin 10/23/3566 3.8  3.5 - 5.0 g/dL Final   AST 01/41/0301 34  15 - 41 U/L Final   ALT 09/04/2022 33  0 - 44 U/L Final   Alkaline Phosphatase 09/04/2022 73  38 - 126 U/L Final   Total Bilirubin 09/04/2022 0.5  0.3 - 1.2 mg/dL Final   GFR, Estimated 09/04/2022 >60  >60 mL/min Final   Comment: (NOTE) Calculated using the CKD-EPI Creatinine Equation (2021)    Anion gap 09/04/2022 10  5 - 15 Final   Performed at [facility], [address], [place], [place] [address]   Hgb A1c MFr Bld 09/04/2022 5.4  4.8 - 5.6 % Final   Comment: (NOTE) Pre diabetes:          5.7%-6.4%  Diabetes:              >6.4%  Glycemic control for   <7.0% adults with diabetes    Mean Plasma Glucose 09/04/2022 108.28  mg/dL Final   Performed at [facility], [address] [place], [place] [address]   Cholesterol 09/04/2022 145  0 - 200 mg/dL Final   Triglycerides 97/28/2060 63  <150 mg/dL Final   HDL 15/61/5379 55  >40 mg/dL Final   Total CHOL/HDL Ratio 09/04/2022 2.6  RATIO Final   VLDL 09/04/2022 13  0 - 40 mg/dL Final   LDL Cholesterol 09/04/2022 77  0 - 99 mg/dL Final   Comment:        Total Cholesterol/HDL:CHD Risk Coronary Heart Disease Risk Table                     Men   Women  1/2 Average Risk   3.4   3.3  Average Risk       5.0   4.4  2 X Average Risk   9.6   7.1  3 X Average Risk  23.4   11.0        Use the calculated Patient Ratio above and the CHD Risk Table to determine the patient's CHD Risk.        ATP III CLASSIFICATION (LDL):  <100     mg/dL   Optimal  432-761  mg/dL   Near or Above                    Optimal  130-159  mg/dL   Borderline  470-929  mg/dL   High  >574     mg/dL   Very High Performed at [facility], [address] [place], [place] [address]    TSH 09/04/2022 2.539  0.350 - 4.500 uIU/mL Final   Comment: Performed by a 3rd Generation assay with a functional sensitivity of  <=0.01 uIU/mL. Performed at [facility], [address] [place], [place] [address]    POC Amphetamine UR 09/03/2022 None Detected  NONE DETECTED (Cut Off Level 1000 ng/mL) Final   POC Secobarbital (BAR) 09/03/2022 None Detected  NONE DETECTED (Cut Off Level 300 ng/mL) Final   POC Buprenorphine (BUP) 09/03/2022 None Detected  NONE DETECTED (Cut Off Level 10 ng/mL) Final   POC Oxazepam (BZO) 09/03/2022  None Detected  NONE DETECTED (Cut Off Level 300 ng/mL) Final   POC Cocaine UR 09/03/2022 None Detected  NONE DETECTED (Cut Off Level 300 ng/mL) Final   POC Methamphetamine UR 09/03/2022 Positive (A)  NONE DETECTED (Cut Off Level 1000 ng/mL) Final   POC Morphine 09/03/2022 None Detected  NONE DETECTED (Cut Off Level 300 ng/mL) Final   POC Methadone UR 09/03/2022 Positive (A)  NONE DETECTED (Cut Off Level 300 ng/mL) Final   POC Oxycodone UR 09/03/2022 None Detected  NONE DETECTED (Cut Off Level 100 ng/mL) Final   POC Marijuana UR 09/03/2022 None Detected  NONE DETECTED (Cut Off Level 50 ng/mL) Final   Color, Urine 09/03/2022 AMBER (A)  YELLOW Final   BIOCHEMICALS MAY BE AFFECTED BY COLOR   APPearance 09/03/2022 CLOUDY (A)  CLEAR Final   Specific Gravity, Urine 09/03/2022 1.029  1.005 - 1.030 Final   pH 09/03/2022 5.0  5.0 - 8.0 Final   Glucose, UA 09/03/2022 NEGATIVE  NEGATIVE mg/dL Final   Hgb urine dipstick 09/03/2022 NEGATIVE  NEGATIVE Final   Bilirubin Urine 09/03/2022 NEGATIVE  NEGATIVE Final   Ketones, ur 09/03/2022 20 (A)  NEGATIVE mg/dL Final   Protein, ur 09/03/2022 100 (A)  NEGATIVE mg/dL Final   Nitrite 09/03/2022 NEGATIVE  NEGATIVE Final   Leukocytes,Ua 09/03/2022 NEGATIVE  NEGATIVE Final   RBC / HPF 09/03/2022 0-5  0 - 5 RBC/hpf Final   WBC, UA 09/03/2022 6-10  0 - 5 WBC/hpf Final   Bacteria, UA 09/03/2022 FEW (A)  NONE SEEN Final   Squamous Epithelial / LPF 09/03/2022 11-20  0 - 5 Final   Mucus 09/03/2022 PRESENT   Final   Hyaline Casts, UA 09/03/2022 PRESENT   Final    Performed at [facility], [place] [address], [place], [place] [address]   SARSCOV2ONAVIRUS 2 AG 09/03/2022 NEGATIVE  NEGATIVE Final   Comment: (NOTE) SARS-CoV-2 antigen NOT DETECTED.   Negative results are presumptive.  Negative results do not preclude SARS-CoV-2 infection and should not be used as the sole basis for treatment or other patient management decisions, including infection  control decisions, particularly in the presence of clinical signs and  symptoms consistent with COVID-19, or in those who have been in contact with the virus.  Negative results must be combined with clinical observations, patient history, and epidemiological information. The expected result is Negative.  Fact Sheet for Patients: HandmadeRecipes.com.cy  Fact Sheet for Healthcare Providers: FuneralLife.at  This test is not yet approved or cleared by the [place] FDA and  has been authorized for detection and/or diagnosis of SARS-CoV-2 by FDA under an Emergency Use Authorization (EUA).  This EUA will remain in effect (meaning this test can be used) for the duration of  the COV                          ID-19 declaration under Section 564(b)(1) of the Act, 21 U.S.C. section 360bbb-3(b)(1), unless the authorization is terminated or revoked sooner.      Blood Alcohol level:  Lab Results  Component Value Date   ETH <10 09/26/2021   ETH <10 69/67/8938    Metabolic Disorder Labs: Lab Results  Component Value Date   HGBA1C 5.4 09/04/2022   MPG 108.28 09/04/2022   MPG 96.8 09/14/2018   No results found for: "PROLACTIN" Lab Results  Component Value Date   CHOL 145 09/04/2022   TRIG 63 09/04/2022   HDL 55 09/04/2022  CHOLHDL 2.6 09/04/2022   VLDL 13 09/04/2022   LDLCALC 77 09/04/2022   LDLCALC 86 09/14/2018    Therapeutic Lab Levels: No results found for: "LITHIUM" Lab Results  Component Value Date   VALPROATE 60 09/18/2018   No  results found for: "CBMZ"  Physical Findings   AIMS    Flowsheet Row [facility]  AIMS Total Score 0      AUDIT    Flowsheet Row [facility]  Alcohol Use Disorder Identification Test Final Score (AUDIT) 0      PHQ2-9    Flowsheet Row ED from 09[facility]26/2021 in [facility]  PHQ-2 Total Score 0  PHQ-9 Total Score 0      Flowsheet Row ED [facility]20[facility] in [facility] Most recent reading at 09/03/2022  9:39 PM ED from 09/03/2022 in [facility] Most recent reading at 09/03/2022  1:53 PM [facility] Most recent reading at 09/26/2021  6:21 AM  C-SSRS RISK CATEGORY No Risk No Risk No Risk        Musculoskeletal  Strength & Muscle Tone: within normal limits Gait & Station: normal Patient leans: N/A  Psychiatric Specialty Exam  Presentation  General Appearance: Bizarre  Eye Contact:Poor  Speech:Clear and Coherent; Normal Rate  Speech Volume:Decreased  Handedness:Right   Mood and Affect  Mood:Anxious  Affect:Congruent   Thought Process  Thought Processes:Disorganized  Descriptions of Associations:Loose  Orientation:Full (Time, Place and Person)  Thought Content:Scattered  Diagnosis of Schizophrenia or Schizoaffective disorder in past: No  Duration of Psychotic Symptoms: Less than six months   Hallucinations:Hallucinations: None  Ideas of Reference:None  Suicidal Thoughts:Suicidal Thoughts: No  Homicidal Thoughts:Homicidal Thoughts: No   Sensorium  Memory:Immediate Poor; Recent Poor; Remote Poor  Judgment:Impaired  Insight:Poor   Executive Functions  Concentration:Poor  Attention Span:Poor  Recall:Poor  Fund of  Knowledge:Poor  Language:Fair   Psychomotor Activity  Psychomotor Activity:Psychomotor Activity: Restlessness   Assets  Assets:Communication Skills; Desire for Improvement; Financial Resources/Insurance; Housing; Resilience   Sleep  Sleep:Sleep: Poor Number of Hours of Sleep: 1 (patient states poor sleep may be an hour last few days)   Nutritional Assessment (For OBS and [facility]BC admissions only) Has the patient had a weight loss or gain of 10 pounds or more in the last 3 months?: No Has the patient had a decrease in food intake/or appetite?: No Does the patient have dental problems?: No Does the patient have eating habits or behaviors that may be indicators of an eating disorder including binging or inducing vomiting?: No Has the patient recently lost weight without trying?: 0    Physical Exam  Physical Exam Cardiovascular:     Rate and Rhythm: Normal rate.  Pulmonary:     Effort: Pulmonary effort is normal.  Neurological:     Mental Status: She is alert and oriented to person, place, and time.  Psychiatric:        Mood and Affect: Mood is anxious.        Thought Content: Thought content normal.        Cognition and Memory: Cognition is impaired. Memory is impaired.        Judgment: Judgment is inappropriate.    Review of Systems  Constitutional:  Negative for chills and fever.  Respiratory:  Negative for shortness of breath.   Cardiovascular:  Negative for chest pain and palpitations.  Gastrointestinal:  Negative for abdominal pain.  Neurological:  Negative for headaches.  Psychiatric/Behavioral:  Positive for memory loss and substance abuse. The patient is nervous/anxious.    Blood pressure (!) 132/93, pulse 83, temperature 98.8 F (37.1 C), temperature source Oral, resp. rate 16, SpO2 98 %. There is no height or weight on file to calculate BMI.  Treatment Plan Summary: Plan seeking inpatient admission  Lauree ChandlerJacqueline Eun Liandro Thelin, NP 09/04/2022 7:53 PM

## 2022-09-04 NOTE — ED Notes (Signed)
Pt was given salad for dinner.

## 2022-09-04 NOTE — Progress Notes (Signed)
Inpatient Behavioral Health Placement  Pt meets inpatient criteria per Tharon Aquas, NP. There are no available beds at [facility] per [facility] Lynnda Shields, RN, and no beds at [facility].  Referral was sent to the following facilities;   Destination Service Provider Address Phone Fax  [facility]  [place], [place] [address] [phone]  [facility]  [address] [place] [address] [phone]  [facility]  [address] [place] [address] [phone]  [facility]  [place], [place] [address] [phone] [facility]  [address] [place], [place] [address] [phone] [facility]  [place] [place] [address] [place]  [facility]  [address] [place] [address] [phone]  [facility]  [address] [place] [address] [phone]  [place] [address] [place] [address] [phone]  [facility]  [address], [place] [address] [phone] [place]  [address], [place] [address] [phone] [facility]  [address], [place] [address] [phone] [facility]  [address] [place] [address] [place]  [address], [place] [address] [phone] [facility]  [address], [place] [address] [phone]  [facility]  [address] [place] [address] [phone] [place]  [address]  [place] [address] [phone]  [facility]  [address], [place] [address] [phone]    Situation ongoing,  CSW will follow up.   Benjaman Kindler, MSW, LCSWA 09/04/2022  @ 9:38 AM

## 2022-09-04 NOTE — Progress Notes (Signed)
Received [name] this AM asleep in her chair bed. She woke up late, compliant with her medication and asked for breakfast. She endorsed feeling anxious this AM. She denied all other psychiatric symptoms.

## 2022-09-04 NOTE — ED Notes (Signed)
Pt sleeping@this  time. No distress or pain noted@this  time. Will continue to monitor for safety

## 2022-09-04 NOTE — Progress Notes (Signed)
Patient has been denied by [facility] due to no appropriate beds available. Patient meets [place] inpatient criteria per Madilyn Fireman, NP. Patient has been faxed out to the following facilities:   [facility]  [place], [place] [address] [phone] [address] [place] [address] [phone]  [facility]  [address], [place] [address] [phone]  [facility]  [place], [place] [address] [phone] [facility]  [address] [place], [place] [address] [phone] [facility]  [place] [address] [place] [address] [place]  [facility]  [address] [place] [address] [phone]  [facility]  [address] [place] [address] [phone]  [place] [address] [place] [address] [phone]  [facility]  [address], [place] [address] [phone] [place]  [address], [place] [address] [phone] [facility]  [address] [place] [address] [phone] [facility]  [address] [place] [address] [place]  [address] [place] [address] [phone] [facility]  [address], [place] [address] [phone]  [facility]  [address] [place] [address] [phone] [place]  [address] [place] [address] [phone]  [facility]  [address] [place] [address] [phone] [place], MSW, LCSW-A  9:48 PM 09/04/2022

## 2022-09-04 NOTE — Progress Notes (Signed)
[facility] LCSW Progress Note   09/04/2022    10:37 PM  [name]   [id number]   Type of Contact and Topic:  Psychiatric Bed Placement   Pt accepted to [place] [facility]    Patient meets inpatient criteria per  Madilyn Fireman, NP  The attending provider will be Dr. Reece Levy   Call report to Ona  Sherol Dade, RN @ [facility] notified.     Pt scheduled  to arrive at [place] 0900.    Mariea Clonts, MSW, LCSW-A  10:39 PM 09/04/2022

## 2022-09-04 NOTE — ED Notes (Signed)
Patient resting quietly in bed with eyes closed, Respirations equal and unlabored. Routine safety checks conducted according to facility protocol. Will continue to monitor for safety. 

## 2022-09-05 DIAGNOSIS — F419 Anxiety disorder, unspecified: Secondary | ICD-10-CM | POA: Diagnosis not present

## 2022-09-05 LAB — PROLACTIN: Prolactin: 39 ng/mL — ABNORMAL HIGH (ref 4.8–23.3)

## 2022-09-05 NOTE — Discharge Instructions (Signed)
Transfer to [facility] for IP admission

## 2022-09-05 NOTE — ED Provider Notes (Signed)
FBC/OBS ASAP Discharge Summary  Date and Time: 09/05/2022 9:25 PM  Name: [name]  MRN:  [id number]   Discharge Diagnoses:  Final diagnoses:  Bipolar I disorder, most recent episode (or current) manic (HCC)  Acute psychosis (HCC)    Subjective:  [name] 41 y.o., female history of substance abuse and Bipolar 1 disorder, TD (per patient occurs intermittently) presented to [facility] on 09/03/22 voluntarily, as a walk in accompanied by her mother with who is concerned that patient has become manic over the last few day.   [name], 41 y.o., female patient seen face to face by this provider, consulted with Dr. Lucianne Muss ; and chart reviewed on 09/05/22.   Stay Summary:  [name] with a mental health history of Bipolar 1, anxiety, ADHD, depression and substance use disorder was admitted to the continuous assessment unit due to concern for safety as patient was display behaviors concerning for mania with psychotic features. UDS + methamphetamines. Patients mother reported that she recently started  a methadone treatment clinic and had been taking methadone daily for a few weeks.  During initial evaluation, patient was demonstrating regressive child like behaviors and refusing to respond to questions. Referencing her mother as "mommy" which her mother reported was odd and not her baseline. Patient admits to being without psychotropic medications for more than 2 years. [name] resides with her father but is close with both parents.[name]'s mother reported that patient has Hep and has never received treatment.  Today on evaluation, [name] reports to this Clinical research associate that she has been accepted to [facility] and she ready to go as she has not had methadone while here at [facility]. During evaluation [name] is sitting upright on the side of her bed, in no acute distress. She is alert, oriented x 4, anxious, cooperative and fairly attentive.  Her mood is anxious  with congruent  affect.  She is clear, coherent with low volume.  Objectively patient appears to be hypomanic and is absent of any bizarre display of behavior and does not appear to be responding to internal stimuli or delusional thinking.  Patient is able to converse very easily distracted, She also denies suicidal/self-harm/homicidal ideation, psychosis, and paranoia,.  Patient answered question appropriately.       Total Time spent with patient: 20 minutes  Past Psychiatric History:  Polysubstance abuse disorder  Bipolar 1 Depression  Anxiety  Alcohol use disorder  Last psychiatric admission at [facility] in [place] 2 years ago   Past Medical History:  Past Medical History:  Diagnosis Date   ADHD (attention deficit hyperactivity disorder)    Alcohol abuse    Anxiety    Bipolar 1 disorder (HCC)    Borderline systolic HTN    Depression    GERD (gastroesophageal reflux disease)    Heroin abuse (HCC)    Hypertension    Kidney stone    Knee pain, bilateral    - patellofemoral syndrome, followed by Dr. [name] at [facility]   Migraines    Migraines    Polysubstance abuse (HCC)    - Hx ETOH, cocaine, THC, IV heroin, narcotics   Psychiatric care    Previous [facility] H&P notes PMHx Schizophrenia, though patient denies this.   Seizures (HCC)     Past Surgical History:  Procedure Laterality Date   KNEE SURGERY     SHOULDER SURGERY     Family History:  Family History  Problem Relation Age of Onset   Hypertension  Father    Family Psychiatric History:   Social History:  Social History   Substance and Sexual Activity  Alcohol Use No     Social History   Substance and Sexual Activity  Drug Use No   Types: IV   Comment: methadone     Social History   Socioeconomic History   Marital status: Single    Spouse name: Not on file   Number of children: Not on file   Years of education: Not on file   Highest education level: Not on file  Occupational History   Not on file  Tobacco Use    Smoking status: Former    Packs/day: 0.50    Types: Cigarettes   Smokeless tobacco: Never  Vaping Use   Vaping Use: Never used  Substance and Sexual Activity   Alcohol use: No   Drug use: No    Types: IV    Comment: methadone    Sexual activity: Not on file  Other Topics Concern   Not on file  Social History Narrative   Not on file   Social Determinants of Health   Financial Resource Strain: Not on file  Food Insecurity: Not on file  Transportation Needs: Not on file  Physical Activity: Not on file  Stress: Not on file  Social Connections: Not on file   SDOH:  SDOH Screenings   Alcohol Screen: Low Risk  (09/13/2018)  Depression (PHQ2-9): Low Risk  (09/26/2021)  Tobacco Use: Medium Risk (09/03/2022)    Tobacco Cessation:  N/A, patient does not currently use tobacco products  Current Medications:  No current facility-administered medications for this encounter.   No current outpatient medications on file.    PTA Medications: (Not in a hospital admission)      09/26/2021    6:21 AM 01/13/2019    1:34 PM  Depression screen PHQ 2/9  Decreased Interest 0   Down, Depressed, Hopeless 0   PHQ - 2 Score 0   Altered sleeping 0   Tired, decreased energy 0   Change in appetite 0   Feeling bad or failure about yourself  0   Trouble concentrating 0   Moving slowly or fidgety/restless 0   Suicidal thoughts 0   PHQ-9 Score 0   Difficult doing work/chores Not difficult at all      Information is confidential and restricted. Go to Review Flowsheets to unlock data.    Flowsheet Row ED from 09/03/2022 in [facility] Most recent reading at 09/03/2022  9:39 PM ED from 09/03/2022 in [facility] Most recent reading at 09/03/2022  1:53 PM ED from 09/26/2021 in [facility]EM[facility]GENCY DEPT Most recent reading at 09/26/2021  6:21 AM  C-SSRS RISK CATEGORY No Risk No Risk No Risk       Musculoskeletal   Strength & Muscle Tone: within normal limits Gait & Station: normal Patient leans: N/A  Psychiatric Specialty Exam  Presentation  General Appearance:  Bizarre  Eye Contact: Poor  Speech: Clear and Coherent; Normal Rate  Speech Volume: Decreased  Handedness: Right   Mood and Affect  Mood: Anxious  Affect: Congruent   Thought Process  Thought Processes: Disorganized  Descriptions of Associations:Loose  Orientation:Full (Time, Place and Person)  Thought Content:Scattered  Diagnosis of Schizophrenia or Schizoaffective disorder in past: No  Duration of Psychotic Symptoms: Less than six months   Hallucinations:Hallucinations: None  Ideas of Reference:None  Suicidal Thoughts:Suicidal Thoughts: No  Homicidal Thoughts:Homicidal Thoughts:  No   Sensorium  Memory: Immediate Poor; Recent Poor; Remote Poor  Judgment: Impaired  Insight: Poor   Executive Functions  Concentration: Poor  Attention Span: Poor  Recall: Poor  Fund of Knowledge: Poor  Language: Fair   Psychomotor Activity  Psychomotor Activity: Psychomotor Activity: Restlessness   Assets  Assets: Communication Skills; Desire for Improvement; Financial Resources/Insurance; Housing; Resilience   Sleep  Sleep: Sleep: Poor   No data recorded  Physical Exam  Blood pressure (!) 132/93, pulse 83, temperature 98.8 F (37.1 C), resp. rate 16, SpO2 98 %. There is no height or weight on file to calculate BMI.  Vitals and nursing note reviewed.  Constitutional:      General: She is not in acute distress.    Appearance: She is well-developed. She is not diaphoretic.  HENT:     Head: Normocephalic and atraumatic.  Eyes:     Pupils: Pupils are equal, round, and reactive to light.  Cardiovascular:     Rate and Rhythm: Normal rate    Pulmonary:     Effort: Pulmonary effort is normal.  Psychiatric:        Mood and Affect: Mood is anxious.        Behavior: Behavior is  hyperactive.        Thought Content: Thought content does not include homicidal or suicidal ideation. Thought content does not include homicidal or suicidal plan.    Demographic Factors:  N/A Loss Factors: NA  Historical Factors: Prior suicide attempts  Risk Reduction Factors:   Sense of responsibility to family, Living with another person, especially a relative, and Positive social support  Continued Clinical Symptoms:  Bipolar Disorder:   Depressive phase Depression:   Impulsivity Alcohol/Substance Abuse/Dependencies Unstable or Poor Therapeutic Relationship  Cognitive Features That Contribute To Risk:  Polarized thinking    Suicide Risk:  Mild:  Suicidal ideation of limited frequency, intensity, duration, and specificity.  There are no identifiable plans, no associated intent, mild dysphoria and related symptoms, good self-control (both objective and subjective assessment), few other risk factors, and identifiable protective factors, including available and accessible social support.  Plan Of Care/Follow-up recommendations:  Other:  Patient meets inpatient criteria due to current mental health crisis, patient is unable to safety care for self and requires medication stabilization and medication management.  Disposition: Transferred to [facility] via safe transport.   Molli Barrows, FNP 09/05/2022, 9:25 PM

## 2022-09-05 NOTE — ED Notes (Signed)
Pt sleeping at present, no distress noted.  Monitoring for safety. 

## 2022-09-05 NOTE — ED Notes (Addendum)
Patient was discharged to [name]. Patient was sent with her EMTALA and discharge information. Patient denies SI/HI and AVH.

## 2022-09-05 NOTE — ED Notes (Signed)
Patient denies SI/H and AVH. Patient is being transported to [facility] via [facility]. Patient is calm and cooperative ate breakfast and took her medications. Report was called in by Probation officer spoke with Ubaldo Glassing, LPN at [facility]

## 2022-09-07 DIAGNOSIS — L03114 Cellulitis of left upper limb: Secondary | ICD-10-CM | POA: Insufficient documentation

## 2022-09-07 DIAGNOSIS — L0390 Cellulitis, unspecified: Secondary | ICD-10-CM | POA: Insufficient documentation

## 2022-10-13 ENCOUNTER — Ambulatory Visit (HOSPITAL_COMMUNITY)
Admission: EM | Admit: 2022-10-13 | Discharge: 2022-10-14 | Disposition: A | Discharge status: Home / Self Care | Payer: 59 | Attending: Psychiatry | Admitting: Psychiatry

## 2022-10-13 DIAGNOSIS — Z1152 Encounter for screening for COVID-19: Secondary | ICD-10-CM | POA: Diagnosis not present

## 2022-10-13 DIAGNOSIS — F909 Attention-deficit hyperactivity disorder, unspecified type: Secondary | ICD-10-CM | POA: Diagnosis not present

## 2022-10-13 DIAGNOSIS — F1510 Other stimulant abuse, uncomplicated: Secondary | ICD-10-CM

## 2022-10-13 DIAGNOSIS — Z79899 Other long term (current) drug therapy: Secondary | ICD-10-CM | POA: Diagnosis not present

## 2022-10-13 DIAGNOSIS — F3112 Bipolar disorder, current episode manic without psychotic features, moderate: Secondary | ICD-10-CM

## 2022-10-13 DIAGNOSIS — I10 Essential (primary) hypertension: Secondary | ICD-10-CM | POA: Insufficient documentation

## 2022-10-13 DIAGNOSIS — Z87891 Personal history of nicotine dependence: Secondary | ICD-10-CM | POA: Diagnosis not present

## 2022-10-13 DIAGNOSIS — Z8669 Personal history of other diseases of the nervous system and sense organs: Secondary | ICD-10-CM | POA: Diagnosis not present

## 2022-10-13 DIAGNOSIS — B1920 Unspecified viral hepatitis C without hepatic coma: Secondary | ICD-10-CM | POA: Insufficient documentation

## 2022-10-13 DIAGNOSIS — F1994 Other psychoactive substance use, unspecified with psychoactive substance-induced mood disorder: Secondary | ICD-10-CM | POA: Insufficient documentation

## 2022-10-13 DIAGNOSIS — F419 Anxiety disorder, unspecified: Secondary | ICD-10-CM | POA: Diagnosis not present

## 2022-10-13 DIAGNOSIS — Z91148 Patient's other noncompliance with medication regimen for other reason: Secondary | ICD-10-CM | POA: Diagnosis not present

## 2022-10-13 LAB — POCT URINE DRUG SCREEN - MANUAL ENTRY (I-SCREEN)
POC Amphetamine UR: NOT DETECTED
POC Buprenorphine (BUP): NOT DETECTED
POC Cocaine UR: NOT DETECTED
POC Marijuana UR: NOT DETECTED
POC Methadone UR: POSITIVE — AB
POC Methamphetamine UR: POSITIVE — AB
POC Morphine: NOT DETECTED
POC Oxazepam (BZO): NOT DETECTED
POC Oxycodone UR: NOT DETECTED
POC Secobarbital (BAR): NOT DETECTED

## 2022-10-13 LAB — RESP PANEL BY RT-PCR (FLU A&B, COVID) ARPGX2
Influenza A by PCR: NEGATIVE
Influenza B by PCR: NEGATIVE
SARS Coronavirus 2 by RT PCR: NEGATIVE

## 2022-10-13 LAB — POC SARS CORONAVIRUS 2 AG: SARSCOV2ONAVIRUS 2 AG: NEGATIVE

## 2022-10-13 MED ORDER — MAGNESIUM HYDROXIDE 400 MG/5ML PO SUSP: 30.0000 mL | Freq: Every day | ORAL | Status: DC | PRN

## 2022-10-13 MED ORDER — ACETAMINOPHEN 325 MG PO TABS: 650.0000 mg | ORAL_TABLET | Freq: Four times a day (QID) | ORAL | Status: DC | PRN

## 2022-10-13 MED ORDER — ZIPRASIDONE MESYLATE 20 MG IM SOLR: 20.0000 mg | INTRAMUSCULAR | Status: DC | PRN

## 2022-10-13 MED ORDER — ALUM & MAG HYDROXIDE-SIMETH 200-200-20 MG/5ML PO SUSP: 30.0000 mL | ORAL | Status: DC | PRN

## 2022-10-13 MED ORDER — OLANZAPINE 10 MG PO TBDP: 10.0000 mg | ORAL_TABLET | Freq: Three times a day (TID) | ORAL | Status: DC | PRN

## 2022-10-13 MED ORDER — TRAZODONE HCL 50 MG PO TABS
50.0000 mg | ORAL_TABLET | Freq: Every evening | ORAL | Status: DC | PRN
  Administered 2022-10-13: 50 mg via ORAL
  Filled 2022-10-13: qty 1

## 2022-10-13 MED ORDER — LORAZEPAM 1 MG PO TABS
1.0000 mg | ORAL_TABLET | ORAL | Status: AC | PRN
  Administered 2022-10-14: 1 mg via ORAL
  Filled 2022-10-13: qty 1

## 2022-10-13 NOTE — ED Notes (Signed)
Pt sleeping at present, no distress noted.  Monitoring for safety. 

## 2022-10-13 NOTE — ED Notes (Signed)
Pt in recliner bed, a/o x4. Denies Si/HI/AVH. Denies pain. She was able to give urine however, it was not enough to test. No noted resp distress. Will continue to monitor for safety

## 2022-10-13 NOTE — ED Provider Notes (Signed)
[facility] Continuous Assessment Admission H&P  Date: 10/13/22 Patient Name: [name] MRN: [id number] Chief Complaint: No chief complaint on file.     Diagnoses:  Final diagnoses:  Bipolar I disorder, most recent episode (or current) manic, moderate (HCC)    HPI: " My mom asked me to come here...she said I am acting weird".  Patient is a 41 year-old female who presents voluntarily, accompanied by her mother. Information obtained from patient's  mother  [name] ([phone]).   Per patient's mother, patient has been has been experiencing manic episodes in which she becomes agitated and verbally aggressive with unusual speech patterns and involuntary movements.  She has been having short-term memory issues to the point of not remembering if she received her medication (goes to methadone clinic every morning). Patient's mother reported that patient can also be hostile and these symptoms started about 3 days ago and were first noticed by patient's father whom she lives with.   Patient has a hx of Bipolar I Disorder, ADHD, MDD, and Substance problems. Her mother reports that she started using drugs at about age 19 and had her first residential treatment at age 32. She has been in and out of treatment facilities since then. She was diagnosed with Bipolar at age 56 and  started using heroin at age 39 she  is currently on methadone treatment.  Patient was evaluated at  this facility a month ago and was transferred to [facility] for treatment. Patient stayed there for one or two days and was transferred to [facility] ED as she was developing Cellulitis. Patient was not sent back to [facility] because there was no bed available for her and she was sent home. Patient's mother believes that this has contributed to the worsening of the symptoms "because she did not received needed treatment and did not follow up". Patient was discharged with no medication recommendations. She is not currently  taking any medications to address her Bipolar and ADHD.   Patient reports a medical history of Seizure (no recent episode), HTN, Hep C, Knee surgery and GIRD. BP 163/118. He reports not taking any related medications.   Patient admits that she has not been taking any antipsychotic medications. She admits to having manic episodes and it is difficult for her to control her mood. She admits that she is experiencing memory issues, restlessness, agitations and TDs. Patient admits to having substance use problems and currently receiving methadone treatment. She expressed her need for medications to control her mood and her manic symptoms.    During this assessment, patient was restless, irritable, and frequently shaking her legs as a sign of impatience. Was alert and oriented and denied SI/HI/AVH. She had poor eye contact and her speech was pressured. She became agitated toward the end of the session and started screaming.  She is voluntarily being admitted to the observation unit for overnight observations and for further evaluation to determine disposition.           PHQ 2-9:  Flowsheet Row ED from 09/26/2021 in [facility]  Thoughts that you would be better off dead, or of hurting yourself in some way Not at all  PHQ-9 Total Score 0       Flowsheet Row ED from 09/03/2022 in [facility] Most recent reading at 09/03/2022  9:39 PM ED from 09/03/2022 in [facility] Most recent reading at 09/03/2022  1:53 PM ED from 09/26/2021 in [facility] Most recent reading at 09/26/2021  6:21 AM  C-SSRS RISK CATEGORY No Risk No Risk No Risk        Total Time spent with patient: 45 minutes  Musculoskeletal  Strength & Muscle Tone: within normal limits Gait & Station: normal Patient leans: Right  Psychiatric Specialty Exam  Presentation General Appearance:   Bizarre  Eye Contact: Poor  Speech: Pressured  Speech Volume: Increased  Handedness: Right   Mood and Affect  Mood: Anxious  Affect: Labile   Thought Process  Thought Processes: Coherent  Descriptions of Associations:Loose  Orientation:Full (Time, Place and Person)  Thought Content:Obsessions; Scattered  Diagnosis of Schizophrenia or Schizoaffective disorder in past: No  Duration of Psychotic Symptoms: Less than six months  Hallucinations:Hallucinations: None  Ideas of Reference:None  Suicidal Thoughts:Suicidal Thoughts: No  Homicidal Thoughts:Homicidal Thoughts: No   Sensorium  Memory: Recent Poor; Immediate Fair; Remote Poor  Judgment: Impaired  Insight: Poor   Executive Functions  Concentration: Poor  Attention Span: Poor  Recall: Poor  Fund of Knowledge: Poor  Language: Fair   Psychomotor Activity  Psychomotor Activity: Psychomotor Activity: Restlessness   Assets  Assets: Manufacturing systems engineer; Social Support; Housing; Transportation   Sleep  Sleep: Sleep: Poor Number of Hours of Sleep: 2 ("I don't sleep")   Nutritional Assessment (For OBS and FBC admissions only) Has the patient had a weight loss or gain of 10 pounds or more in the last 3 months?: No Has the patient had a decrease in food intake/or appetite?: No Does the patient have dental problems?: No Does the patient have eating habits or behaviors that may be indicators of an eating disorder including binging or inducing vomiting?: No Has the patient recently lost weight without trying?: 0 Has the patient been eating poorly because of a decreased appetite?: 0 Malnutrition Screening Tool Score: 0    Physical Exam Constitutional:      Appearance: Normal appearance. She is normal weight.  HENT:     Head: Normocephalic.     Right Ear: Tympanic membrane normal.     Left Ear: Tympanic membrane normal.     Nose: Nose normal.  Eyes:     Extraocular  Movements: Extraocular movements intact.     Pupils: Pupils are equal, round, and reactive to light.  Cardiovascular:     Rate and Rhythm: Normal rate and regular rhythm.     Comments: Elevated BP Pulmonary:     Effort: Pulmonary effort is normal.     Breath sounds: Normal breath sounds.  Musculoskeletal:        General: Normal range of motion.     Cervical back: Normal range of motion.  Neurological:     General: No focal deficit present.     Mental Status: She is alert and oriented to person, place, and time.  Review of Systems  Constitutional: Negative.   HENT: Negative.    Eyes: Negative.   Respiratory: Negative.    Cardiovascular:        HTN  Gastrointestinal: Negative.   Genitourinary: Negative.   Musculoskeletal:        Knee pain (Hx of Knee surgery)  Skin: Negative.   Neurological:  Positive for seizures.       Hx of seizures, not recent  Endo/Heme/Allergies: Negative.   Psychiatric/Behavioral:  Positive for substance abuse. The patient is nervous/anxious and has insomnia.     Blood pressure (!) 163/118, pulse 98, temperature 98.9 F (37.2 C), temperature source Oral, resp. rate  19, SpO2 100 %. There is no height or weight on file to calculate BMI.  Past Psychiatric History: Patient reports hx of Depression, ADHD, Anxiety and Polysubstance use   Is the patient at risk to self? No  Has the patient been a risk to self in the past 6 months? No .    Has the patient been a risk to self within the distant past? No   Is the patient a risk to others? No   Has the patient been a risk to others in the past 6 months? No   Has the patient been a risk to others within the distant past? No   Past Medical History:  Past Medical History:  Diagnosis Date   ADHD (attention deficit hyperactivity disorder)    Alcohol abuse    Anxiety    Bipolar 1 disorder (HCC)    Borderline systolic HTN    Depression    GERD (gastroesophageal reflux disease)    Heroin abuse (HCC)     Hypertension    Kidney stone    Knee pain, bilateral    - patellofemoral syndrome, [facility]   Migraines    Migraines    Polysubstance abuse (HCC)    - Hx ETOH, cocaine, THC, IV heroin, narcotics   Psychiatric care    Previous [facility] H&P notes PMHx Schizophrenia, though patient denies this.   Seizures (HCC)     Past Surgical History:  Procedure Laterality Date   KNEE SURGERY     SHOULDER SURGERY      Family History:  Family History  Problem Relation Age of Onset   Hypertension Father     Social History:  Social History   Socioeconomic History   Marital status: Single    Spouse name: Not on file   Number of children: Not on file   Years of education: Not on file   Highest education level: Not on file  Occupational History   Not on file  Tobacco Use   Smoking status: Former    Packs/day: 0.50    Types: Cigarettes   Smokeless tobacco: Never  Vaping Use   Vaping Use: Never used  Substance and Sexual Activity   Alcohol use: No   Drug use: No    Types: IV    Comment: methadone    Sexual activity: Not on file  Other Topics Concern   Not on file  Social History Narrative   Not on file   Social Determinants of Health   Financial Resource Strain: Not on file  Food Insecurity: Not on file  Transportation Needs: Not on file  Physical Activity: Not on file  Stress: Not on file  Social Connections: Not on file  Intimate Partner Violence: Not on file    SDOH:  SDOH Screenings   Alcohol Screen: Low Risk  (09/13/2018)  Depression (PHQ2-9): Low Risk  (09/26/2021)  Tobacco Use: Medium Risk (09/03/2022)    Last Labs:  Admission on 09/03/2022, Discharged on 09/05/2022  Component Date Value Ref Range Status   SARS Coronavirus 2 by RT PCR 09/03/2022 NEGATIVE  NEGATIVE Final   Comment: (NOTE) SARS-CoV-2 target nucleic acids are NOT DETECTED.  The SARS-CoV-2 RNA is generally detectable in upper respiratory specimens during the acute phase of  infection. The lowest concentration of SARS-CoV-2 viral copies this assay can detect is 138 copies/mL. A negative result does not preclude SARS-Cov-2 infection and should not be used as the sole basis for treatment or other patient  management decisions. A negative result may occur with  improper specimen collection/handling, submission of specimen other than nasopharyngeal swab, presence of viral mutation(s) within the areas targeted by this assay, and inadequate number of viral copies(<138 copies/mL). A negative result must be combined with clinical observations, patient history, and epidemiological information. The expected result is Negative.  Fact Sheet for Patients:  BloggerCourse.com  Fact Sheet for Healthcare Providers:  SeriousBroker.it  This test is no                          t yet approved or cleared by the [place] FDA and  has been authorized for detection and/or diagnosis of SARS-CoV-2 by FDA under an Emergency Use Authorization (EUA). This EUA will remain  in effect (meaning this test can be used) for the duration of the COVID-19 declaration under Section 564(b)(1) of the Act, 21 U.S.C.section 360bbb-3(b)(1), unless the authorization is terminated  or revoked sooner.       Influenza A by PCR 09/03/2022 NEGATIVE  NEGATIVE Final   Influenza B by PCR 09/03/2022 NEGATIVE  NEGATIVE Final   Comment: (NOTE) The Xpert Xpress SARS-CoV-2/FLU/RSV plus assay is intended as an aid in the diagnosis of influenza from Nasopharyngeal swab specimens and should not be used as a sole basis for treatment. Nasal washings and aspirates are unacceptable for Xpert Xpress SARS-CoV-2/FLU/RSV testing.  Fact Sheet for Patients: BloggerCourse.com  Fact Sheet for Healthcare Providers: SeriousBroker.it  This test is not yet approved or cleared by the [place] FDA and has been  authorized for detection and/or diagnosis of SARS-CoV-2 by FDA under an Emergency Use Authorization (EUA). This EUA will remain in effect (meaning this test can be used) for the duration of the COVID-19 declaration under Section 564(b)(1) of the Act, 21 U.S.C. section 360bbb-3(b)(1), unless the authorization is terminated or revoked.  Performed at [facility], [address], [place], [place] [address]    WBC 09/04/2022 10.5  4.0 - 10.5 K/uL Final   RBC 09/04/2022 5.73 (H)  3.87 - 5.11 MIL/uL Final   Hemoglobin 09/04/2022 14.7  12.0 - 15.0 g/dL Final   HCT 44/81/8563 44.9  36.0 - 46.0 % Final   MCV 09/04/2022 78.4 (L)  80.0 - 100.0 fL Final   MCH 09/04/2022 25.7 (L)  26.0 - 34.0 pg Final   MCHC 09/04/2022 32.7  30.0 - 36.0 g/dL Final   RDW 14/97/0263 14.9  11.5 - 15.5 % Final   Platelets 09/04/2022 358  150 - 400 K/uL Final   nRBC 09/04/2022 0.0  0.0 - 0.2 % Final   Neutrophils Relative % 09/04/2022 57  % Final   Neutro Abs 09/04/2022 6.0  1.7 - 7.7 K/uL Final   Lymphocytes Relative 09/04/2022 31  % Final   Lymphs Abs 09/04/2022 3.2  0.7 - 4.0 K/uL Final   Monocytes Relative 09/04/2022 7  % Final   Monocytes Absolute 09/04/2022 0.8  0.1 - 1.0 K/uL Final   Eosinophils Relative 09/04/2022 3  % Final   Eosinophils Absolute 09/04/2022 0.4  0.0 - 0.5 K/uL Final   Basophils Relative 09/04/2022 1  % Final   Basophils Absolute 09/04/2022 0.1  0.0 - 0.1 K/uL Final   Immature Granulocytes 09/04/2022 1  % Final   Abs Immature Granulocytes 09/04/2022 0.05  0.00 - 0.07 K/uL Final   Performed at [facility], [address], [place], [place] [address]   Sodium 09/04/2022 142  135 -  145 mmol/L Final   Potassium 09/04/2022 4.0  3.5 - 5.1 mmol/L Final   Chloride 09/04/2022 107  98 - 111 mmol/L Final   CO2 09/04/2022 25  22 - 32 mmol/L Final   Glucose, Bld 09/04/2022 106 (H)  70 - 99 mg/dL Final   Glucose reference range applies only to samples taken after fasting for at least 8  hours.   BUN 09/04/2022 22 (H)  6 - 20 mg/dL Final   Creatinine, Ser 09/04/2022 0.73  0.44 - 1.00 mg/dL Final   Calcium 61/44/3154 9.8  8.9 - 10.3 mg/dL Final   Total Protein 00/86/7619 7.7  6.5 - 8.1 g/dL Final   Albumin 50/93/2671 3.8  3.5 - 5.0 g/dL Final   AST 24/58/0998 34  15 - 41 U/L Final   ALT 09/04/2022 33  0 - 44 U/L Final   Alkaline Phosphatase 09/04/2022 73  38 - 126 U/L Final   Total Bilirubin 09/04/2022 0.5  0.3 - 1.2 mg/dL Final   GFR, Estimated 09/04/2022 >60  >60 mL/min Final   Comment: (NOTE) Calculated using the CKD-EPI Creatinine Equation (2021)    Anion gap 09/04/2022 10  5 - 15 Final   Performed at [facility], [address] [place], [place] [address]   Hgb A1c MFr Bld 09/04/2022 5.4  4.8 - 5.6 % Final   Comment: (NOTE) Pre diabetes:          5.7%-6.4%  Diabetes:              >6.4%  Glycemic control for   <7.0% adults with diabetes    Mean Plasma Glucose 09/04/2022 108.28  mg/dL Final   Performed at [facility], [address] [place], [place] [address]   Cholesterol 09/04/2022 145  0 - 200 mg/dL Final   Triglycerides 67/34/1937 63  <150 mg/dL Final   HDL 90/24/0973 55  >40 mg/dL Final   Total CHOL/HDL Ratio 09/04/2022 2.6  RATIO Final   VLDL 09/04/2022 13  0 - 40 mg/dL Final   LDL Cholesterol 09/04/2022 77  0 - 99 mg/dL Final   Comment:        Total Cholesterol/HDL:CHD Risk Coronary Heart Disease Risk Table                     Men   Women  1/2 Average Risk   3.4   3.3  Average Risk       5.0   4.4  2 X Average Risk   9.6   7.1  3 X Average Risk  23.4   11.0        Use the calculated Patient Ratio above and the CHD Risk Table to determine the patient's CHD Risk.        ATP III CLASSIFICATION (LDL):  <100     mg/dL   Optimal  532-992  mg/dL   Near or Above                    Optimal  130-159  mg/dL   Borderline  426-834  mg/dL   High  >196     mg/dL   Very High Performed at [facility], [address],  [place], [place] [address]    TSH 09/04/2022 2.539  0.350 - 4.500 uIU/mL Final   Comment: Performed by a 3rd Generation assay with a functional sensitivity of <=0.01 uIU/mL. Performed at [facility], [address]  [place], [place] [address]    Prolactin 09/04/2022 39.0 (H)  4.8 - 23.3 ng/mL Final   Comment: (NOTE) Performed At: [facility] [place] [address] [place], [place] [id number] Jolene Schimke MD [id number]    POC Amphetamine UR 09/03/2022 None Detected  NONE DETECTED (Cut Off Level 1000 ng/mL) Final   POC Secobarbital (BAR) 09/03/2022 None Detected  NONE DETECTED (Cut Off Level 300 ng/mL) Final   POC Buprenorphine (BUP) 09/03/2022 None Detected  NONE DETECTED (Cut Off Level 10 ng/mL) Final   POC Oxazepam (BZO) 09/03/2022 None Detected  NONE DETECTED (Cut Off Level 300 ng/mL) Final   POC Cocaine UR 09/03/2022 None Detected  NONE DETECTED (Cut Off Level 300 ng/mL) Final   POC Methamphetamine UR 09/03/2022 Positive (A)  NONE DETECTED (Cut Off Level 1000 ng/mL) Final   POC Morphine 09/03/2022 None Detected  NONE DETECTED (Cut Off Level 300 ng/mL) Final   POC Methadone UR 09/03/2022 Positive (A)  NONE DETECTED (Cut Off Level 300 ng/mL) Final   POC Oxycodone UR 09/03/2022 None Detected  NONE DETECTED (Cut Off Level 100 ng/mL) Final   POC Marijuana UR 09/03/2022 None Detected  NONE DETECTED (Cut Off Level 50 ng/mL) Final   Color, Urine 09/03/2022 AMBER (A)  YELLOW Final   BIOCHEMICALS MAY BE AFFECTED BY COLOR   APPearance 09/03/2022 CLOUDY (A)  CLEAR Final   Specific Gravity, Urine 09/03/2022 1.029  1.005 - 1.030 Final   pH 09/03/2022 5.0  5.0 - 8.0 Final   Glucose, UA 09/03/2022 NEGATIVE  NEGATIVE mg/dL Final   Hgb urine dipstick 09/03/2022 NEGATIVE  NEGATIVE Final   Bilirubin Urine 09/03/2022 NEGATIVE  NEGATIVE Final   Ketones, ur 09/03/2022 20 (A)  NEGATIVE mg/dL Final   Protein, ur 30/86/5784 100 (A)  NEGATIVE mg/dL Final   Nitrite 69/62/9528 NEGATIVE  NEGATIVE Final    Leukocytes,Ua 09/03/2022 NEGATIVE  NEGATIVE Final   RBC / HPF 09/03/2022 0-5  0 - 5 RBC/hpf Final   WBC, UA 09/03/2022 6-10  0 - 5 WBC/hpf Final   Bacteria, UA 09/03/2022 FEW (A)  NONE SEEN Final   Squamous Epithelial / LPF 09/03/2022 11-20  0 - 5 Final   Mucus 09/03/2022 PRESENT   Final   Hyaline Casts, UA 09/03/2022 PRESENT   Final   Performed at [facility], [address], [place], [place] [address]   SARSCOV2ONAVIRUS 2 AG 09/03/2022 NEGATIVE  NEGATIVE Final   Comment: (NOTE) SARS-CoV-2 antigen NOT DETECTED.   Negative results are presumptive.  Negative results do not preclude SARS-CoV-2 infection and should not be used as the sole basis for treatment or other patient management decisions, including infection  control decisions, particularly in the presence of clinical signs and  symptoms consistent with COVID-19, or in those who have been in contact with the virus.  Negative results must be combined with clinical observations, patient history, and epidemiological information. The expected result is Negative.  Fact Sheet for Patients: https://www.jennings-kim.com/  Fact Sheet for Healthcare Providers: https://alexander-rogers.biz/  This test is not yet approved or cleared by the [place] FDA and  has been authorized for detection and/or diagnosis of SARS-CoV-2 by FDA under an Emergency Use Authorization (EUA).  This EUA will remain in effect (meaning this test can be used) for the duration of  the COV                          ID-19 declaration under Section 564(b)(1) of the Act, 21  U.S.C. section 360bbb-3(b)(1), unless the authorization is terminated or revoked sooner.      Allergies: Shellfish allergy  PTA Medications: (Not in a hospital admission) .    Recommendations  Based on my evaluation the patient does not appear to have an emergency medical condition. Admit to OBS for overnight monitoring  To be reevaluated in AM to  determine disposition  Labs ordered:  CBC, UDS, UPT, Hep function panel, A1c, Ethanol, TSH, POC-SARS and EKG.   Medications ordered: Trazodone 50 mg PO, HS PRN for insomnia Olanzapine 10 mg PO, Ativan 1 mg PO and Geodon 20 mg IM per agitation protocol.       Ronelle Nigh, NP 10/13/22  4:02 PM

## 2022-10-13 NOTE — Progress Notes (Signed)
Received [name] in the assessment room with her mom, attempted blood draw which was unsuccessful. She was encouraged to drink more fluids. She has a PCOT cup at the bedside for urine.  Her skin assessment was done with multiple tattoos. She was escorted to the OBS area given nourishments. She stated she is here to get back on her medications. She denied all other psychiatric symptoms.

## 2022-10-14 DIAGNOSIS — F1510 Other stimulant abuse, uncomplicated: Secondary | ICD-10-CM

## 2022-10-14 DIAGNOSIS — F3112 Bipolar disorder, current episode manic without psychotic features, moderate: Secondary | ICD-10-CM | POA: Diagnosis not present

## 2022-10-14 DIAGNOSIS — Z3202 Encounter for pregnancy test, result negative: Secondary | ICD-10-CM

## 2022-10-14 LAB — URINALYSIS, ROUTINE W REFLEX MICROSCOPIC
Bilirubin Urine: NEGATIVE
Glucose, UA: NEGATIVE mg/dL
Hgb urine dipstick: NEGATIVE
Ketones, ur: NEGATIVE mg/dL
Leukocytes,Ua: NEGATIVE
Nitrite: NEGATIVE
Protein, ur: 30 mg/dL — AB
Specific Gravity, Urine: 1.025 (ref 1.005–1.030)
pH: 6 (ref 5.0–8.0)

## 2022-10-14 LAB — PREGNANCY, URINE: Preg Test, Ur: NEGATIVE

## 2022-10-14 LAB — POCT PREGNANCY, URINE: Preg Test, Ur: NEGATIVE

## 2022-10-14 MED ORDER — OLANZAPINE 10 MG PO TBDP: 10.0000 mg | ORAL_TABLET | Freq: Every day | ORAL | Status: DC

## 2022-10-14 MED ORDER — AMLODIPINE BESYLATE 5 MG PO TABS
5.0000 mg | ORAL_TABLET | Freq: Every day | ORAL | Status: DC
  Administered 2022-10-14: 5 mg via ORAL
  Filled 2022-10-14: qty 1

## 2022-10-14 MED ORDER — METHADONE HCL 10 MG PO TABS
60.0000 mg | ORAL_TABLET | Freq: Every day | ORAL | Status: DC
  Administered 2022-10-14: 60 mg via ORAL
  Filled 2022-10-14: qty 6

## 2022-10-14 MED ORDER — OLANZAPINE 5 MG PO TBDP: 5.0000 mg | ORAL_TABLET | Freq: Every morning | ORAL | 0 refills | Status: DC

## 2022-10-14 MED ORDER — OLANZAPINE 5 MG PO TBDP
5.0000 mg | ORAL_TABLET | Freq: Every morning | ORAL | Status: DC
  Administered 2022-10-14: 5 mg via ORAL
  Filled 2022-10-14: qty 1

## 2022-10-14 MED ORDER — OLANZAPINE 10 MG PO TBDP: 10.0000 mg | ORAL_TABLET | Freq: Every day | ORAL | 0 refills | Status: DC

## 2022-10-14 NOTE — ED Provider Notes (Cosign Needed Addendum)
FBC/OBS ASAP Discharge Summary  Date and Time: 10/14/2022 11:46 AM  Name: [name]  MRN:  [id number]   Discharge Diagnoses:  Final diagnoses:  Bipolar I disorder, most recent episode (or current) manic, moderate ([place])    Subjective: Patient states "I am feeling better, I am ready to go home."  [name] reports recent stressors include inability to receive methadone dose at established outpatient methadone clinic, [facility] [place], on yesterday.  She is unable to recall situation surrounding inability to receive dose.  She reports she has been stable on methadone and followed by outpatient clinic at [facility] for approximately 3 years. Several episodes of relapse on substance during the three years that she has been linked with [facility]. Current plan includes follow-up at [facility] clinic on tomorrow.  Patient is reassessed, face-to-face, by nurse practitioner.  She is seated in observation area upon my approach, no apparent distress.  She is alert and oriented, pleasant and cooperative during assessment.  She presents with euthymic mood, congruent affect.  [name] endorses mental health history including bipolar affective disorder, anxiety, depression, polysubstance use disorder, substance-induced mood disorder and ADHD.  She is not linked with outpatient psychiatry for medication management currently.  She does meet with her individual counselor,"[name]," weekly at [employer].  She endorses history of multiple previous inpatient psychiatric hospitalizations.  No family mental health history reported.  She denies suicidal and homicidal ideation.  Denies history of nonsuicidal self-harm behavior.  She easily contracts verbally for safety with this Probation officer.  She denies auditory and visual hallucinations.  There is no evidence of delusional thought content and no indication that patient is responding to internal stimuli.  She denies symptoms of paranoia.  [name] resides in  [place] with her father.  She receives disability benefits also assists with the care of her father who has recently been diagnosed with dementia.  She denies alcohol and substance use, endorses history of both alcohol and substance use disorder.  She endorses average appetite and decreased sleep.  Patient offered support and encouragement.  Patient offered facility based crisis admission, she declined.  She gives verbal consent to speak with her mother, [name] phone number [phone]. Spoke with patient's mother who reports patient's behavior has been intermittently bizarre over the last 4 days.  Patient's mother shares that recently an unidentified female has begun staying with patient at her father's home.  Concern that patient has potentially relapsed on alcohol or substance use. Patient's mother agrees with plan for outpatient psychiatry follow-up.   Patient and family are educated and verbalize understanding of mental health resources and other crisis services in the community. They are instructed to call 911 and present to the nearest emergency room should patient experience any suicidal/homicidal ideation, auditory/visual/hallucinations, or detrimental worsening of mental health condition.     Stay Summary HPI competed 10/13/2022- 1602pm:  " My mom asked me to come here...she said I am acting weird".   Patient is a 41 year-old female who presents voluntarily, accompanied by her mother. Information obtained from patient's  mother  [name] [id number]).   Per patient's mother, patient has been has been experiencing manic episodes in which she becomes agitated and verbally aggressive with unusual speech patterns and involuntary movements.  She has been having short-term memory issues to the point of not remembering if she received her medication (goes to methadone clinic every morning). Patient's mother reported that patient can also be hostile and these symptoms started about 3  days ago and were  first noticed by patient's father whom she lives with.    Patient has a hx of Bipolar I Disorder, ADHD, MDD, and Substance problems. Her mother reports that she started using drugs at about age 25 and had her first residential treatment at age 63. She has been in and out of treatment facilities since then. She was diagnosed with Bipolar at age 63 and  started using heroin at age 67 she  is currently on methadone treatment.  Patient was evaluated at  this facility a month ago and was transferred to [facility] for treatment. Patient stayed there for one or two days and was transferred to [facility] ED as she was developing Cellulitis. Patient was not sent back to [facility] because there was no bed available for her and she was sent home. Patient's mother believes that this has contributed to the worsening of the symptoms "because she did not received needed treatment and did not follow up". Patient was discharged with no medication recommendations. She is not currently taking any medications to address her Bipolar and ADHD.    Patient reports a medical history of Seizure (no recent episode), HTN, Hep C, Knee surgery and GIRD. BP 163/118. He reports not taking any related medications.    Patient admits that she has not been taking any antipsychotic medications. She admits to having manic episodes and it is difficult for her to control her mood. She admits that she is experiencing memory issues, restlessness, agitations and TDs. Patient admits to having substance use problems and currently receiving methadone treatment. She expressed her need for medications to control her mood and her manic symptoms.      During this assessment, patient was restless, irritable, and frequently shaking her legs as a sign of impatience. Was alert and oriented and denied SI/HI/AVH. She had poor eye contact and her speech was pressured. She became agitated toward the end of the session and started screaming.   She is voluntarily being admitted to the observation unit for overnight observations and for further evaluation to determine disposition.         Total Time spent with patient: 30 minutes  Past Psychiatric History: see above Past Medical History:  Past Medical History:  Diagnosis Date   ADHD (attention deficit hyperactivity disorder)    Alcohol abuse    Anxiety    Bipolar 1 disorder (HCC)    Borderline systolic HTN    Depression    GERD (gastroesophageal reflux disease)    Heroin abuse (HCC)    Hypertension    Kidney stone    Knee pain, bilateral    - patellofemoral syndrome, followed by Dr. [name] at [facility]   Migraines    Migraines    Polysubstance abuse (HCC)    - Hx ETOH, cocaine, THC, IV heroin, narcotics   Psychiatric care    Previous [facility] H&P notes PMHx Schizophrenia, though patient denies this.   Seizures (HCC)     Past Surgical History:  Procedure Laterality Date   KNEE SURGERY     SHOULDER SURGERY     Family History:  Family History  Problem Relation Age of Onset   Hypertension Father    Family Psychiatric History: none reported Social History:  Social History   Substance and Sexual Activity  Alcohol Use No     Social History   Substance and Sexual Activity  Drug Use No   Types: IV   Comment: methadone     Social History   Socioeconomic History  Marital status: Single    Spouse name: Not on file   Number of children: Not on file   Years of education: Not on file   Highest education level: Not on file  Occupational History   Not on file  Tobacco Use   Smoking status: Former    Packs/day: 0.50    Types: Cigarettes   Smokeless tobacco: Never  Vaping Use   Vaping Use: Never used  Substance and Sexual Activity   Alcohol use: No   Drug use: No    Types: IV    Comment: methadone    Sexual activity: Not on file  Other Topics Concern   Not on file  Social History Narrative   Not on file   Social Determinants of Health   Financial  Resource Strain: Not on file  Food Insecurity: Not on file  Transportation Needs: Not on file  Physical Activity: Not on file  Stress: Not on file  Social Connections: Not on file   SDOH:  SDOH Screenings   Alcohol Screen: Low Risk  (09/13/2018)  Depression (PHQ2-9): Low Risk  (09/26/2021)  Tobacco Use: Medium Risk (09/03/2022)    Tobacco Cessation:  A prescription for an FDA-approved tobacco cessation medication was offered at discharge and the patient refused  Current Medications:  Current Facility-Administered Medications  Medication Dose Route Frequency Provider Last Rate Last Admin   alum & mag hydroxide-simeth (MAALOX/MYLANTA) 200-200-20 MG/5ML suspension 30 mL  30 mL Oral Q4H PRN Maple Hudson, Veronique M, NP       amLODipine (NORVASC) tablet 5 mg  5 mg Oral Daily Lucky Rathke, FNP   5 mg at 10/14/22 1112   magnesium hydroxide (MILK OF MAGNESIA) suspension 30 mL  30 mL Oral Daily PRN Haynes Kerns, NP       methadone (DOLOPHINE) tablet 60 mg  60 mg Oral Daily Lucky Rathke, FNP   60 mg at 10/14/22 1111   OLANZapine zydis (ZYPREXA) disintegrating tablet 10 mg  10 mg Oral Q8H PRN Haynes Kerns, NP       And   ziprasidone (GEODON) injection 20 mg  20 mg Intramuscular PRN Maple Hudson, Veronique M, NP       OLANZapine zydis (ZYPREXA) disintegrating tablet 10 mg  10 mg Oral QHS Lucky Rathke, FNP       OLANZapine zydis (ZYPREXA) disintegrating tablet 5 mg  5 mg Oral q morning Lucky Rathke, FNP   5 mg at 10/14/22 0947   traZODone (DESYREL) tablet 50 mg  50 mg Oral QHS PRN Haynes Kerns, NP   50 mg at 10/13/22 2122   Current Outpatient Medications  Medication Sig Dispense Refill   amLODipine (NORVASC) 10 MG tablet Take 1 tablet by mouth daily.     methadone (DOLOPHINE) 10 MG/ML solution Take 80 mg by mouth daily.     MOBIC 15 MG tablet Take 15 mg by mouth daily.      PTA Medications: (Not in a hospital admission)      09/26/2021    6:21 AM 01/13/2019    1:34  PM  Depression screen PHQ 2/9  Decreased Interest 0   Down, Depressed, Hopeless 0   PHQ - 2 Score 0   Altered sleeping 0   Tired, decreased energy 0   Change in appetite 0   Feeling bad or failure about yourself  0   Trouble concentrating 0   Moving slowly or fidgety/restless 0   Suicidal thoughts 0  PHQ-9 Score 0   Difficult doing work/chores Not difficult at all      Information is confidential and restricted. Go to Review Flowsheets to unlock data.    [place] ED from 10/13/2022 in [facility] Most recent reading at 10/13/2022  6:59 PM ED from 09/03/2022 in [facility] Most recent reading at 09/03/2022  9:39 PM ED from 09/03/2022 in [facility] Most recent reading at 09/03/2022  1:53 PM  C-SSRS RISK CATEGORY No Risk No Risk No Risk       Musculoskeletal  Strength & Muscle Tone: within normal limits Gait & Station: normal Patient leans: N/A  Psychiatric Specialty Exam  Presentation  General Appearance:  Appropriate for Environment; Casual  Eye Contact: Good  Speech: Clear and Coherent; Normal Rate  Speech Volume: Normal  Handedness: Right   Mood and Affect  Mood: Euthymic  Affect: Appropriate; Congruent   Thought Process  Thought Processes: Coherent; Goal Directed; Linear  Descriptions of Associations:Intact  Orientation:Full (Time, Place and Person)  Thought Content:Logical; WDL  Diagnosis of Schizophrenia or Schizoaffective disorder in past: No  Duration of Psychotic Symptoms: Less than six months   Hallucinations:Hallucinations: None  Ideas of Reference:None  Suicidal Thoughts:Suicidal Thoughts: No  Homicidal Thoughts:Homicidal Thoughts: No   Sensorium  Memory: Immediate Good  Judgment: Intact  Insight: Present   Executive Functions  Concentration: Fair  Attention Span: Fair  Recall: [place] of  Knowledge: Good  Language: Good   Psychomotor Activity  Psychomotor Activity: Psychomotor Activity: Normal   Assets  Assets: Communication Skills; Desire for Improvement; Financial Resources/Insurance; Housing; Leisure Time; Physical Health; Social Support; Resilience   Sleep  Sleep: Sleep: Fair Number of Hours of Sleep: 2 ("I don't sleep")   Nutritional Assessment (For OBS and FBC admissions only) Has the patient had a weight loss or gain of 10 pounds or more in the last 3 months?: No Has the patient had a decrease in food intake/or appetite?: No Does the patient have dental problems?: No Does the patient have eating habits or behaviors that may be indicators of an eating disorder including binging or inducing vomiting?: No Has the patient recently lost weight without trying?: 0 Has the patient been eating poorly because of a decreased appetite?: 0 Malnutrition Screening Tool Score: 0    Physical Exam  Physical Exam Vitals and nursing note reviewed.  Constitutional:      Appearance: Normal appearance. She is well-developed.  HENT:     Head: Normocephalic and atraumatic.     Nose: Nose normal.  Cardiovascular:     Rate and Rhythm: Normal rate.  Pulmonary:     Effort: Pulmonary effort is normal.  Musculoskeletal:        General: Normal range of motion.     Cervical back: Normal range of motion.  Skin:    General: Skin is warm and dry.  Neurological:     Mental Status: She is alert and oriented to person, place, and time.  Psychiatric:        Attention and Perception: Attention and perception normal.        Mood and Affect: Mood and affect normal.        Speech: Speech normal.        Behavior: Behavior normal. Behavior is cooperative.        Thought Content: Thought content normal.        Cognition and Memory: Cognition normal.    Review  of Systems  Constitutional: Negative.   HENT: Negative.    Eyes: Negative.   Respiratory: Negative.     Cardiovascular: Negative.   Gastrointestinal: Negative.   Genitourinary: Negative.   Musculoskeletal: Negative.   Skin: Negative.   Neurological: Negative.   Psychiatric/Behavioral:  Positive for substance abuse.    Blood pressure (!) 142/92, pulse 74, temperature 98 F (36.7 C), temperature source Oral, resp. rate 18, SpO2 97 %. There is no height or weight on file to calculate BMI.  Demographic Factors:  Caucasian  Loss Factors: NA  Historical Factors: NA  Risk Reduction Factors:   Sense of responsibility to family, Living with another person, especially a relative, Positive social support, Positive therapeutic relationship, and Positive coping skills or problem solving skills  Continued Clinical Symptoms:  Alcohol/Substance Abuse/Dependencies Previous Psychiatric Diagnoses and Treatments  Cognitive Features That Contribute To Risk:  None    Suicide Risk:  Minimal: No identifiable suicidal ideation.  Patients presenting with no risk factors but with morbid ruminations; may be classified as minimal risk based on the severity of the depressive symptoms  Plan Of Care/Follow-up recommendations:  UDS collected and resulted on 10/13/2022- positive for methamphetamine and methadone.  Patient reviewed with Dr Hampton Abbot.  Follow up with outpatient psychiatry, resources provided.  Medications: -Olanzapine Zydis 5 mg every morning/mood -Olanzapine Zydis 10 mg nightly/mood -Amlodipine 10 mg daily -Methadone 80 mg daily -Mobic 15 mg daily  Follow up with substance use treatment resources provided.  Disposition: Discharge  Lucky Rathke, FNP 10/14/2022, 11:46 AM

## 2022-10-14 NOTE — ED Notes (Signed)
Attempted blood draw x2 in separate areas.  No flash noted and no blood return.  Pt reports previous IV drug use.  Encouraged pt to drink more fluids.  Will attempt draw again if pt permits.

## 2022-10-14 NOTE — ED Notes (Signed)
Pt's father called for brief update on pt.  Pt confirmed that it would be okay to speak with father.  Father reports concerns that pt is requesting to leave to use drugs again.  He stated "her drug man was at the house earlier but is gone now."  Provider made aware of father's concerns.  Pt has requested to discharge, provider made aware and is currently speaking with pt.  Breathing is even and unlabored.  Will continue to monitor for safety.

## 2022-10-14 NOTE — ED Notes (Signed)
Pt appears to be agitated this morning.  She has been at the phone and noted to slam it down after speaking with someone. She is also noted to be pacing unit. She reports she goes to the methadone clinic every morning and would like to be discharged to get to the clinic.  Provider made aware.  Pt denies, pain, SI, HI, and AVH.  Breathing is even and unlabored.  Will continue to monitor for safety.

## 2022-10-14 NOTE — Discharge Instructions (Addendum)
Base on the information you have provided and the presenting issue, outpatient services with therapy and psychiatry have been recommended.  It is imperative that you follow through with treatment recommendations within 5-7 days from the of discharge to mitigate further risk to your safety and mental well-being. A list of referrals has been provided below to get you started.  You are not limited to the list provided.  In case of an urgent crisis, you may contact the Mobile Crisis Unit with [facility] at 1.206-022-4230.  For additional resources, you can either go to www.nc211.org or dial 211.  Through this, you will be able to find community resources that can assist with treatment, housing assistance, emergency assistance with utilities, legal assistance, etc.    Besides mental health recommendations to continue with therapy and medication management, the [facility] [place] helps women with maintaining independence and self-sufficiency.  They facilitate groups helping women returning to work, Print production planner, community education workshops, and [employer] counseling along with other support groups.  It is important for women to develop a support network that will be beneficial in a time of crisis.  [address] [place], [place], [address] [phone] phone  Outpatient Therapy and Psychiatry for Medicare Recipients  [facility] [address] [place], [place], [address] [phone] phone  [facility] [address] [place], [place], [address] [address] phone ([address], [employer] [address] - [place], [address], [place], [place], [employer], [address], [employer] Healthy [place], [place], [address], [place], [place], [place], [employer], T[employer], [employer], [employer], [place])  [facility] [address], [address] [place], [place], [address] [phone] phone  [facility] [address], [address] [place], [place], [address] [phone] phone  [facility] [address] [place], [place], [address] [phone] phone [phone] fax  [facility], [place] [address], [place], [address] [phone] phone  [facility] [address] [place], [place], [address] [phone] phone [phone] fax  [facility] [address] [place], [place], [address] [phone] phone  Jovita Kussmaul [address] [place], [place], [address] [phone] phone  [facility] [address] [place], [place], [address] [phone] phone [phone] fax   Support Groups ([facility] [place]) Free No pre-registration required Welcoming and supportive environment [employer].pl for more information  [facility] Offered 3x/week for individuals with mental health challenges. Trained facilitators have a diagnosis or experience with the group's focus. Must be 59yrs+ All meetings are confidential.  Tuesdays, 7p - 8:30p [employer], Room A8 [address] [facility]  Thursdays, 7p - 8:30p [facility], [address] [facility]  Saturdays, 10a - 11:30a [facility], [address] [facility] For individuals who have a loved one with mental health challenges. Learn strategies to maintain emotional, mental, and physical well-being while supporting your loved one. Offer information to assist your loved one. Learn not to lose sight of their strengths and abilities. Trained facilitator has a loved one with a mental health challenge. Must be 50yrs+  Tuesdays, 7p - 8:30p [employer], Room A12 [address] [employer] Peer-led support group for [employer]. Tuesdays, 7p - 8:30p [facility], [address] [facility] For women interested in sharing  life's experiences. Wednesdays, 12:30p - 2p [employer] [facility] [address], [facility]  Patient  is instructed prior to discharge to:  Take all medications as prescribed by his/her mental healthcare provider. Report any adverse effects and or reactions from the medicines to his/her outpatient provider promptly. Keep all scheduled appointments, to ensure that you are getting refills on time and to avoid any interruption in your medication.  If you are unable to keep an appointment call to reschedule.  Be sure to follow-up with resources and follow-up appointments provided.  Patient has been instructed & cautioned: To not engage in alcohol and or illegal drug use while on prescription medicines. In the event of worsening symptoms, patient is instructed to call the crisis hotline, 911 and or go to the nearest ED for appropriate evaluation and treatment of symptoms. To follow-up with his/her primary care provider for your other medical issues, concerns and or health care needs.  Information: -National Suicide Prevention Lifeline 1-800-SUICIDE or 210-449-0620.  -988 offers 24/7 access to trained crisis counselors who can help people experiencing mental health-related distress. People can call or text 988 or chat 988lifeline.org for themselves or if they are worried about a loved one who may need crisis support.

## 2022-10-14 NOTE — Progress Notes (Signed)
LCSW Progress Note   Per Doran Heater, NP, this pt does not require psychiatric hospitalization at this time.  Pt is psychiatrically cleared.  Discharge instructions include several resources for outpatient therapy and medication management and community resources for support groups.  EDP Doran Heater, NP, has been notified.  Hansel Starling, MSW, LCSW [facility] [phone] or [phone]

## 2022-10-14 NOTE — ED Notes (Signed)
Pt sleeping at present, no distress noted.  Monitoring for safety. 

## 2023-06-29 ENCOUNTER — Emergency Department (HOSPITAL_COMMUNITY): Payer: 59

## 2023-06-29 ENCOUNTER — Encounter (HOSPITAL_COMMUNITY): Payer: Self-pay

## 2023-06-29 ENCOUNTER — Emergency Department (HOSPITAL_COMMUNITY)
Admission: EM | Admit: 2023-06-29 | Discharge: 2023-07-01 | Disposition: A | Discharge status: Home / Self Care | Payer: 59 | Attending: Emergency Medicine | Admitting: Emergency Medicine

## 2023-06-29 ENCOUNTER — Ambulatory Visit (INDEPENDENT_AMBULATORY_CARE_PROVIDER_SITE_OTHER)
Admission: EM | Admit: 2023-06-29 | Discharge: 2023-06-29 | Disposition: A | Discharge status: Other Acute Inpatient Hospital | Payer: 59 | Source: Home / Self Care

## 2023-06-29 ENCOUNTER — Other Ambulatory Visit: Payer: Self-pay

## 2023-06-29 DIAGNOSIS — F1190 Opioid use, unspecified, uncomplicated: Secondary | ICD-10-CM | POA: Diagnosis not present

## 2023-06-29 DIAGNOSIS — I159 Secondary hypertension, unspecified: Secondary | ICD-10-CM | POA: Insufficient documentation

## 2023-06-29 DIAGNOSIS — F3178 Bipolar disorder, in full remission, most recent episode mixed: Secondary | ICD-10-CM | POA: Diagnosis present

## 2023-06-29 DIAGNOSIS — I517 Cardiomegaly: Secondary | ICD-10-CM | POA: Insufficient documentation

## 2023-06-29 DIAGNOSIS — Z79891 Long term (current) use of opiate analgesic: Secondary | ICD-10-CM | POA: Insufficient documentation

## 2023-06-29 DIAGNOSIS — F1110 Opioid abuse, uncomplicated: Secondary | ICD-10-CM | POA: Insufficient documentation

## 2023-06-29 DIAGNOSIS — F3163 Bipolar disorder, current episode mixed, severe, without psychotic features: Secondary | ICD-10-CM | POA: Insufficient documentation

## 2023-06-29 DIAGNOSIS — Z79899 Other long term (current) drug therapy: Secondary | ICD-10-CM | POA: Insufficient documentation

## 2023-06-29 DIAGNOSIS — Z5986 Financial insecurity: Secondary | ICD-10-CM | POA: Diagnosis not present

## 2023-06-29 DIAGNOSIS — Z87891 Personal history of nicotine dependence: Secondary | ICD-10-CM | POA: Insufficient documentation

## 2023-06-29 DIAGNOSIS — F11959 Opioid use, unspecified with opioid-induced psychotic disorder, unspecified: Secondary | ICD-10-CM | POA: Insufficient documentation

## 2023-06-29 DIAGNOSIS — F4310 Post-traumatic stress disorder, unspecified: Secondary | ICD-10-CM | POA: Insufficient documentation

## 2023-06-29 DIAGNOSIS — R519 Headache, unspecified: Secondary | ICD-10-CM | POA: Insufficient documentation

## 2023-06-29 DIAGNOSIS — F909 Attention-deficit hyperactivity disorder, unspecified type: Secondary | ICD-10-CM | POA: Insufficient documentation

## 2023-06-29 DIAGNOSIS — F419 Anxiety disorder, unspecified: Secondary | ICD-10-CM | POA: Insufficient documentation

## 2023-06-29 DIAGNOSIS — F32A Depression, unspecified: Secondary | ICD-10-CM | POA: Insufficient documentation

## 2023-06-29 DIAGNOSIS — F3160 Bipolar disorder, current episode mixed, unspecified: Secondary | ICD-10-CM | POA: Diagnosis not present

## 2023-06-29 DIAGNOSIS — G4700 Insomnia, unspecified: Secondary | ICD-10-CM | POA: Insufficient documentation

## 2023-06-29 DIAGNOSIS — I10 Essential (primary) hypertension: Secondary | ICD-10-CM | POA: Insufficient documentation

## 2023-06-29 LAB — URINALYSIS, COMPLETE (UACMP) WITH MICROSCOPIC
Bilirubin Urine: NEGATIVE
Glucose, UA: NEGATIVE mg/dL
Hgb urine dipstick: NEGATIVE
Ketones, ur: 5 mg/dL — AB
Nitrite: NEGATIVE
Protein, ur: >=300 mg/dL — AB
Specific Gravity, Urine: 1.024 (ref 1.005–1.030)
pH: 5 (ref 5.0–8.0)

## 2023-06-29 LAB — POCT URINE DRUG SCREEN - MANUAL ENTRY (I-SCREEN)
POC Amphetamine UR: NOT DETECTED
POC Buprenorphine (BUP): NOT DETECTED
POC Cocaine UR: NOT DETECTED
POC Marijuana UR: NOT DETECTED
POC Methadone UR: POSITIVE — AB
POC Methamphetamine UR: NOT DETECTED
POC Morphine: NOT DETECTED
POC Oxazepam (BZO): NOT DETECTED
POC Oxycodone UR: NOT DETECTED
POC Secobarbital (BAR): NOT DETECTED

## 2023-06-29 LAB — COMPREHENSIVE METABOLIC PANEL
ALT: 58 U/L — ABNORMAL HIGH (ref 0–44)
AST: 55 U/L — ABNORMAL HIGH (ref 15–41)
Albumin: 3.7 g/dL (ref 3.5–5.0)
Alkaline Phosphatase: 87 U/L (ref 38–126)
Anion gap: 10 (ref 5–15)
BUN: 9 mg/dL (ref 6–20)
CO2: 25 mmol/L (ref 22–32)
Calcium: 9.2 mg/dL (ref 8.9–10.3)
Chloride: 103 mmol/L (ref 98–111)
Creatinine, Ser: 0.73 mg/dL (ref 0.44–1.00)
GFR, Estimated: >60 mL/min (ref >60)
Glucose, Bld: 106 mg/dL — ABNORMAL HIGH (ref 70–99)
Potassium: 3.9 mmol/L (ref 3.5–5.1)
Sodium: 138 mmol/L (ref 135–145)
Total Bilirubin: <0.1 mg/dL — ABNORMAL LOW (ref 0.3–1.2)
Total Protein: 7.6 g/dL (ref 6.5–8.1)

## 2023-06-29 LAB — ACETAMINOPHEN LEVEL: Acetaminophen (Tylenol), Serum: <10 ug/mL — ABNORMAL LOW (ref 10–30)

## 2023-06-29 LAB — CBC WITH DIFFERENTIAL/PLATELET
Abs Immature Granulocytes: 0.08 10*3/uL — ABNORMAL HIGH (ref 0.00–0.07)
Basophils Absolute: 0.1 10*3/uL (ref 0.0–0.1)
Basophils Relative: 1 %
Eosinophils Absolute: 0.1 10*3/uL (ref 0.0–0.5)
Eosinophils Relative: 1 %
HCT: 44.5 % (ref 36.0–46.0)
Hemoglobin: 14.1 g/dL (ref 12.0–15.0)
Immature Granulocytes: 1 %
Lymphocytes Relative: 17 %
Lymphs Abs: 2.5 10*3/uL (ref 0.7–4.0)
MCH: 25.5 pg — ABNORMAL LOW (ref 26.0–34.0)
MCHC: 31.7 g/dL (ref 30.0–36.0)
MCV: 80.5 fL (ref 80.0–100.0)
Monocytes Absolute: 1 10*3/uL (ref 0.1–1.0)
Monocytes Relative: 7 %
Neutro Abs: 11.1 10*3/uL — ABNORMAL HIGH (ref 1.7–7.7)
Neutrophils Relative %: 73 %
Platelets: 349 10*3/uL (ref 150–400)
RBC: 5.53 MIL/uL — ABNORMAL HIGH (ref 3.87–5.11)
RDW: 13.4 % (ref 11.5–15.5)
WBC: 14.9 10*3/uL — ABNORMAL HIGH (ref 4.0–10.5)
nRBC: 0 % (ref 0.0–0.2)

## 2023-06-29 LAB — POC URINE PREG, ED: Preg Test, Ur: NEGATIVE

## 2023-06-29 LAB — RAPID URINE DRUG SCREEN, HOSP PERFORMED
Amphetamines: NOT DETECTED
Barbiturates: NOT DETECTED
Benzodiazepines: NOT DETECTED
Cocaine: NOT DETECTED
Opiates: NOT DETECTED
Tetrahydrocannabinol: NOT DETECTED

## 2023-06-29 LAB — SALICYLATE LEVEL: Salicylate Lvl: <7 mg/dL — ABNORMAL LOW (ref 7.0–30.0)

## 2023-06-29 LAB — ETHANOL: Alcohol, Ethyl (B): <10 mg/dL (ref <10)

## 2023-06-29 MED ORDER — HYDROXYZINE HCL 25 MG PO TABS: 25.0000 mg | ORAL_TABLET | Freq: Three times a day (TID) | ORAL | Status: DC | PRN

## 2023-06-29 MED ORDER — ACETAMINOPHEN 325 MG PO TABS: 650.0000 mg | ORAL_TABLET | Freq: Four times a day (QID) | ORAL | Status: DC | PRN

## 2023-06-29 MED ORDER — LORAZEPAM 2 MG/ML IJ SOLN
1.0000 mg | Freq: Once | INTRAMUSCULAR | Status: AC
  Administered 2023-06-29: 1 mg via INTRAMUSCULAR
  Filled 2023-06-29: qty 1

## 2023-06-29 MED ORDER — OLANZAPINE 5 MG PO TBDP: 5.0000 mg | ORAL_TABLET | Freq: Two times a day (BID) | ORAL | 0 refills | Status: DC

## 2023-06-29 MED ORDER — IBUPROFEN 400 MG PO TABS
400.0000 mg | ORAL_TABLET | Freq: Once | ORAL | Status: AC | PRN
  Administered 2023-06-30: 400 mg via ORAL
  Filled 2023-06-29: qty 1

## 2023-06-29 MED ORDER — ALUM & MAG HYDROXIDE-SIMETH 200-200-20 MG/5ML PO SUSP: 30.0000 mL | ORAL | Status: DC | PRN

## 2023-06-29 MED ORDER — HYDRALAZINE HCL 25 MG PO TABS
25.0000 mg | ORAL_TABLET | Freq: Once | ORAL | Status: AC
  Administered 2023-06-29: 25 mg via ORAL
  Filled 2023-06-29: qty 1

## 2023-06-29 MED ORDER — HYDRALAZINE HCL 25 MG PO TABS
25.0000 mg | ORAL_TABLET | Freq: Three times a day (TID) | ORAL | Status: DC
  Administered 2023-06-29 – 2023-06-30 (×2): 25 mg via ORAL
  Filled 2023-06-29 (×2): qty 1

## 2023-06-29 MED ORDER — MAGNESIUM HYDROXIDE 400 MG/5ML PO SUSP: 30.0000 mL | Freq: Every day | ORAL | Status: DC | PRN

## 2023-06-29 MED ORDER — LORAZEPAM 1 MG PO TABS: 1.0000 mg | ORAL_TABLET | Freq: Once | ORAL | Status: AC

## 2023-06-29 MED ORDER — HYDRALAZINE HCL 25 MG PO TABS: 50.0000 mg | ORAL_TABLET | ORAL | Status: DC

## 2023-06-29 NOTE — ED Notes (Signed)
Patient dressed into purple scrubs and belongings collected and secured by sitter. Sitter escorted patient to Room 48

## 2023-06-29 NOTE — ED Notes (Signed)
Patient discharge to mced via ems due to increase blood pressure

## 2023-06-29 NOTE — Discharge Instructions (Signed)
Transferred to [facility] emergency department Dr. Florentina Addison is the accepting MD for medical clearance

## 2023-06-29 NOTE — ED Provider Notes (Signed)
[place] EMERGENCY DEPARTMENT AT [facility] Provider Note   CSN: [id number] Arrival date & time: 06/29/23  1849     History  No chief complaint on file.   [name] is a 42 y.o. female.  42 year old female with a history of polysubstance abuse including opiates on methadone, hypertension, bipolar, and substance-induced mood disorder who presents emergency department with hypertension.  Patient presented to behavioral health urgent care where she was going to be admitted for detox from fentanyl.  Says that when she got there her blood pressure was very high so they referred her to the emergency department.  Says that she has a very mild headache over the past few days that she attributes to her blood pressure.  Has difficulty characterizing otherwise.  No chest pain or shortness of breath.  Denies any SI, HI, or AVH.  Says that she feels anxious and would like something for her anxiety at this time. Has been accepted by [facility] if her workup here is reassuring.  Denies any recent alcohol use.  No illicit benzodiazepine use.  Was previously on metoprolol and clonidine but has not taken in years. Snorted fentanyl 3 days ago. No nausea, vomiting, or diarrhea.        Home Medications Prior to Admission medications   Medication Sig Start Date End Date Taking? Authorizing Provider  amLODipine (NORVASC) 10 MG tablet Take 1 tablet by mouth daily. 09/11/22   [provider]  methadone (DOLOPHINE) 10 MG/ML solution Take 80 mg by mouth daily.    [provider]  OLANZapine zydis (ZYPREXA) 5 MG disintegrating tablet Take 1 tablet (5 mg total) by mouth 2 (two) times daily. 06/29/23   Ardis Hughs, NP      Allergies    Shellfish allergy    Review of Systems   Review of Systems  Physical Exam Updated Vital Signs BP (!) 195/119 (BP Location: Left Arm)   Pulse (!) 104   Temp 98.6 F (37 C) (Oral)   Resp 18   Ht 5\' 6"  (1.676 m)   SpO2 94%   BMI 37.12  kg/m  Physical Exam Vitals and nursing note reviewed.  Constitutional:      General: She is not in acute distress.    Appearance: She is well-developed.  HENT:     Head: Normocephalic and atraumatic.     Right Ear: External ear normal.     Left Ear: External ear normal.     Nose: Nose normal.  Eyes:     Extraocular Movements: Extraocular movements intact.     Conjunctiva/sclera: Conjunctivae normal.     Pupils: Pupils are equal, round, and reactive to light.  Cardiovascular:     Rate and Rhythm: Normal rate and regular rhythm.     Heart sounds: No murmur heard. Pulmonary:     Effort: Pulmonary effort is normal. No respiratory distress.     Breath sounds: Normal breath sounds.  Abdominal:     General: Abdomen is flat. There is no distension.     Palpations: Abdomen is soft. There is no mass.     Tenderness: There is no abdominal tenderness. There is no guarding.  Musculoskeletal:     Cervical back: Normal range of motion and neck supple.     Right lower leg: No edema.     Left lower leg: No edema.  Skin:    General: Skin is warm and dry.  Neurological:     Mental Status: She is alert  and oriented to person, place, and time. Mental status is at baseline.     Cranial Nerves: No cranial nerve deficit.     Sensory: No sensory deficit.     Motor: No weakness.  Psychiatric:        Mood and Affect: Mood normal.     ED Results / Procedures / Treatments   Labs (all labs ordered are listed, but only abnormal results are displayed) Labs Reviewed  COMPREHENSIVE METABOLIC PANEL - Abnormal; Notable for the following components:      Result Value   Glucose, Bld 106 (*)    AST 55 (*)    ALT 58 (*)    Total Bilirubin <0.1 (*)    All other components within normal limits  CBC WITH DIFFERENTIAL/PLATELET - Abnormal; Notable for the following components:   WBC 14.9 (*)    RBC 5.53 (*)    MCH 25.5 (*)    Neutro Abs 11.1 (*)    Abs Immature Granulocytes 0.08 (*)    All other  components within normal limits  SALICYLATE LEVEL - Abnormal; Notable for the following components:   Salicylate Lvl <7.0 (*)    All other components within normal limits  ACETAMINOPHEN LEVEL - Abnormal; Notable for the following components:   Acetaminophen (Tylenol), Serum <10 (*)    All other components within normal limits  ETHANOL  RAPID URINE DRUG SCREEN, HOSP PERFORMED  HCG, SERUM, QUALITATIVE    EKG None  Radiology CT Head Wo Contrast  Result Date: 06/29/2023 CLINICAL DATA:  Hypertension, headache EXAM: CT HEAD WITHOUT CONTRAST TECHNIQUE: Contiguous axial images were obtained from the base of the skull through the vertex without intravenous contrast. RADIATION DOSE REDUCTION: This exam was performed according to the departmental dose-optimization program which includes automated exposure control, adjustment of the mA and/or kV according to patient size and/or use of iterative reconstruction technique. COMPARISON:  03/03/2017 FINDINGS: Brain: No acute intracranial abnormality. Specifically, no hemorrhage, hydrocephalus, mass lesion, acute infarction, or significant intracranial injury. Vascular: No hyperdense vessel or unexpected calcification. Skull: No acute calvarial abnormality. Sinuses/Orbits: No acute findings Other: None IMPRESSION: No acute intracranial abnormality. Electronically Signed   By: Charlett Nose M.D.   On: 06/29/2023 22:25    Procedures Procedures    Medications Ordered in ED Medications  hydrALAZINE (APRESOLINE) tablet 25 mg (has no administration in time range)  hydrALAZINE (APRESOLINE) tablet 25 mg (25 mg Oral Given 06/29/23 2055)    ED Course/ Medical Decision Making/ A&P                             Medical Decision Making Amount and/or Complexity of Data Reviewed Labs: ordered. Radiology: ordered.  Risk Prescription drug management.   [name] is a 42 y.o. female with comorbidities that complicate the patient evaluation including  polysubstance abuse including opiates on methadone, hypertension, bipolar, and substance-induced mood disorder who presents emergency department with hypertension.    Initial Ddx:  Hypertensive emergency, ICH, withdrawal  MDM/Course:  Patient arrives emergency department with elevated blood pressure.  No psychiatric symptoms that would warrant IVC at this time.  Only symptom at this time is mild headache but has a nonfocal neuroexam.  Her CT scan did not reveal any acute intracranial abnormalities.  Was given some hydralazine with mild improvement of her blood pressure.  EKG without any concerning findings.  No chest pain or other symptoms that would be concerning for hypertensive emergency otherwise.  Upon re-evaluation patient was doing well.  No significant withdrawal symptoms at this time.  Is still hypertensive but is medically cleared at this time.  Will start her on hydralazine 3 times daily.  Has been accepted to [facility] for further management.  This patient presents to the ED for concern of complaints listed in HPI, this involves an extensive number of treatment options, and is a complaint that carries with it a high risk of complications and morbidity. Disposition including potential need for admission considered.   Dispo: [facility]  Records reviewed  outpatient notes The following labs were independently interpreted: Chemistry and show no acute abnormality I independently reviewed the following imaging with scope of interpretation limited to determining acute life threatening conditions related to emergency care: CT Head and agree with the radiologist interpretation with the following exceptions: none I personally reviewed and interpreted the pt's EKG: see above for interpretation  I have reviewed the patients home medications and made adjustments as needed Social Determinants of health:  Polysubstance use       Final Clinical Impression(s) / ED Diagnoses Final diagnoses:  Secondary  hypertension  Opioid abuse [facility])    Rx / DC Orders ED Discharge Orders     None         Rondel Baton, MD 06/29/23 2337

## 2023-06-29 NOTE — ED Notes (Signed)
Pt  admitted to obs. denies SI/HI/AVH. Calm, cooperative throughout interview process. Skin assessment completed. Oriented to unit. Meal and drink offered. . Pt verbally contract for safety. Will monitor for safety.

## 2023-06-29 NOTE — ED Notes (Signed)
Assumed care of patient at this time

## 2023-06-29 NOTE — ED Provider Notes (Signed)
Behavioral Health Urgent Care Medical Screening Exam  Patient Name: [name] MRN: [id number] Date of Evaluation: 06/29/23 Chief Complaint:  At the recommendation of her counselor at [facility] treatment center due to fentanyl relapse and odd behavior. Diagnosis:  Final diagnoses:  Bipolar disorder, current episode mixed, severe, without psychotic features (HCC)    History of Present illness: [name] is a 42 y.o. female   patient presented to [facility] as a walk in accompanied by her mother at the recommendation of her counselor at [facility] treatment center due to fentanyl relapse and odd behavior.  Garth Schlatter Kaman, 42 y.o., female patient seen face to face by this provider, consulted with Dr. Lucianne Muss; and chart reviewed on 06/29/23.  Per chart review patient has a past psychiatric history of PTSD, bipolar affective disorder with mania and depression, Anxiety, ADHD, polysubstance abuse (opioids), substance-induced mood disorder.  She reports over 20 previous inpatient psychiatric admissions.  She is unable to call her last admission.  She was diagnosed with bipolar disorder around 42 years of age.  Patient reports she is currently prescribed methadone 100 mg daily for opiate use disorder..  She receives services at [facility] treatment center in [place].  Facility is closed at this time and was unable to verify dosage of methadone.   Patient reports past medication trials for bipolar: Abilify-caused TD, Tegretol and Depakote caused hair loss.  UDS positive for methadone. EKG QTC 479  Patient's mother was present for the evaluation with patient's permission. She is disheveled and can't remember if she has showered. She is inattentive and makes fleeting eye contact.  She is alert, oriented x 4 and  cooperative.  She answers questions with short yes or no answers and appears guarded.  She is extremely anxious and appears restless. She has EPS that is due to taking Abilify many years  ago.  She rocks back and forth in the chair, she has involuntary movements as she pats her feet on the floor and moves her hands and forearms in a repetitive motion.  She has tried a medication to help with EPS but does not remember the name of it.  States what ever it was it made her dystonia worse.  Over the past few weeks she has felt an increase in her depression and anxiety.  She began to feel that she needed to be hospitalized, she felt impulsive and three days ago she snorted $20 worth of fentanyl.  States, "I thought it would help me get in the hospital". She denies all other substance use. She is odd, withdrawn and her mother has to help answer questions through out the assessment. She has difficulty focusing and is easily distracted.  She has not slept in over three days.  She is having racing thoughts and her thought process is scattered. She denies paranoia but appears somewhat paranoid if she looks around the room.  She is denying SI/HI/AVH.  She does not appear to be responding to internal/external stimuli.  However she does appear manic.  Discussed inpatient psychiatric mission and patient is in agreement   Nursing tried multiple times to obtain lab work and was unsuccessful.  In addition patient was administered 1 mg of Ativan IM due to anxiety in hopes that blood pressure would be decreased.Patient's blood pressure continues to remain high at 202/108 with pulse rate of 116.  She is denying any shortness of breath, headache, or chest pain.  She is denying any opiate withdrawal symptoms.  Contacted Dr. Karene Fry  at [facility], ED and patient will be transferred for medical clearance.   Flowsheet Row ED from 06/29/2023 in [facility] ED from 10/13/2022 in [facility] ED from 09/03/2022 in [facility]  C-SSRS RISK CATEGORY No Risk No Risk No Risk       Psychiatric Specialty Exam  Presentation  General  Appearance:Disheveled  Eye Contact:Fleeting  Speech:Clear and Coherent  Speech Volume:Normal  Handedness:Right   Mood and Affect  Mood: Anxious; Depressed; Dysphoric  Affect: Congruent   Thought Process  Thought Processes: Coherent  Descriptions of Associations:Intact  Orientation:Full (Time, Place and Person)  Thought Content:Scattered  Diagnosis of Schizophrenia or Schizoaffective disorder in past: No  Duration of Psychotic Symptoms: Less than six months  Hallucinations:None  Ideas of Reference:None  Suicidal Thoughts:No  Homicidal Thoughts:No   Sensorium  Memory: Immediate Fair; Recent Poor; Remote Poor  Judgment: Fair  Insight: Present   Executive Functions  Concentration: Poor  Attention Span: Poor  Recall: Poor  Fund of Knowledge: Poor  Language: Poor   Psychomotor Activity  Psychomotor Activity: Restlessness; Extrapyramidal Side Effects (EPS) Tardive Dyskinesia No   Assets  Assets: Housing; Physical Health; Resilience; Social Support; Leisure Time   Sleep  Sleep: Poor  Number of hours:  0   Physical Exam: Physical Exam Constitutional:      Appearance: She is obese.  Eyes:     General:        Right eye: No discharge.        Left eye: No discharge.  Cardiovascular:     Rate and Rhythm: Normal rate.  Pulmonary:     Effort: Pulmonary effort is normal. No respiratory distress.  Musculoskeletal:        General: Normal range of motion.     Cervical back: Normal range of motion.  Neurological:     Mental Status: She is alert and oriented to person, place, and time.  Psychiatric:        Attention and Perception: She is inattentive.        Mood and Affect: Mood is anxious and depressed.        Speech: Speech normal.        Behavior: Behavior is withdrawn. Behavior is cooperative.        Thought Content: Thought content normal.        Cognition and Memory: Cognition normal.        Judgment: Judgment is  impulsive.    Review of Systems  Constitutional: Negative.   HENT: Negative.    Eyes: Negative.   Respiratory: Negative.    Cardiovascular: Negative.   Musculoskeletal: Negative.   Skin: Negative.   Neurological: Negative.   Psychiatric/Behavioral:  Positive for depression. The patient is nervous/anxious and has insomnia.    Blood pressure (!) 202/108, pulse (!) 116, temperature 100 F (37.8 C), temperature source Oral, resp. rate 20, SpO2 96%. There is no height or weight on file to calculate BMI.  Musculoskeletal: Strength & Muscle Tone: within normal limits Gait & Station: normal Patient leans: N/A   [facility] MSE Discharge Disposition for Follow up and Recommendations: Based on my evaluation I certify that psychiatric inpatient services furnished can reasonably be expected to improve the patient's condition which I recommend transfer to an appropriate accepting facility.   Patient will be transferred to the [facility] emergency department for medical clearance.  Dr. Karene Fry is the accepting MD.  Please notify [facility] when patient is medically  cleared.  Patient has been recommended for inpatient psychiatric admission.  [facility] notified and patient has been accepted to [facility] today.   Patient is currently prescribed methadone reported dosages 100 mg/day.  However [facility] treatment [facility] is closed at this time and dosage cannot be verified.  Contacted pharmacist she confirms dosage cannot be verified until the a.m.  Medications Ativan 1 mg IM injection-now due to anxiety and increased BP Start Zyprexa 5 mg p.o. twice daily-mood stabilization Methadone 100 mg p.o.-pharmacy will verify dosage in a.m.-opiate use disorder  Recommended lab Orders         CBC with Differential/Platelet         Comprehensive metabolic panel         Hemoglobin A1c         Magnesium         Ethanol         Lipid panel         TSH         Urinalysis, Complete w Microscopic -Urine, Clean  Catch         CK         CBG monitoring         POC urine preg, ED         POCT Urine Drug Screen - (I-Screen)     EKG  QTC 479   Ardis Hughs, NP 06/29/2023, 5:55 PM

## 2023-06-29 NOTE — Progress Notes (Signed)
   06/29/23 1513  [facility] Triage Screening (Walk-ins at [facility] only)  How Did You Hear About [place]? Self  What Is the Reason for Your Visit/Call Today? Pasha Maring 42 year old female present to [facility] requesting inpatient treatment for substance use. Patient stated she is seen at [facility] for Methadone. Reported she seen her counselor today who referred her to come to [facility] for detox. Patient reported she relapsed on fentanyl 3 days abusing $20 worth. Denied expressing withdrawal symptoms. Prior to relapsing patient stated she was sober for maybe 23-month. Denied additional substance usage. Denied SI/HI and AVH.  How Long Has This Been Causing You Problems? <Week  Have You Recently Had Any Thoughts About Hurting Yourself? No (denied thoughts of hurting herself.)  Are You Planning to Commit Suicide/Harm Yourself At This time? No (denied SI)  Have you Recently Had Thoughts About Hurting Someone Karolee Ohs? No (denied HI)  Are You Planning To Harm Someone At This Time? No (denied HI)  Are you currently experiencing any auditory, visual or other hallucinations? No (denied)  Have You Used Any Alcohol or Drugs in the Past 24 Hours? No (Denied substance usage in the past 24 hours. Report last usage of fentanyl 3 days ago.)  Do you have any current medical co-morbidities that require immediate attention? No  Clinician description of patient physical appearance/behavior: Patient is fidgetting, kicking legs back and forth while sitting.Dressed appropriately for the weather.  What Do You Feel Would Help You the Most Today? Alcohol or Drug Use Treatment  If access to [facility] was not available, would you have sought care in the Emergency Department? Yes  Determination of Need Routine (7 days)  Options For Referral Chemical Dependency Intensive Outpatient Therapy (CDIOP)

## 2023-06-29 NOTE — ED Notes (Signed)
Report called to [facility] rn. Ems called

## 2023-06-29 NOTE — ED Notes (Addendum)
Pt. A/O x3. Pt. resting in bed at the current. Denies any s/s of HTN. States BP is normally high but has not been taking BP rx's D/T not having an active rx. Ativan 1mg  IM given @ 1625. Pt being sent out to [facility] for medical clearance d/t BP of 202/108 and P of 116. EMS here to transport Pt. Pt originally being D/c to [facility] BMU 309. Bed contingent upon lab collection. Unable to collect labs, hospital to draw labs.

## 2023-06-29 NOTE — ED Triage Notes (Addendum)
Pt bib [facility] from [facility] where she went for detox from fentanyl. Pt denies SI/HI at this time. Pt was sent to ED for hypertension. Pt is supposed be taking medication for htn and bipolar but has not been.  Pt has been accepted at [facility] so after med cleared she can be transferred.

## 2023-06-30 ENCOUNTER — Encounter (HOSPITAL_COMMUNITY): Payer: Self-pay | Admitting: Psychiatry

## 2023-06-30 DIAGNOSIS — F3160 Bipolar disorder, current episode mixed, unspecified: Secondary | ICD-10-CM | POA: Diagnosis present

## 2023-06-30 DIAGNOSIS — F1190 Opioid use, unspecified, uncomplicated: Secondary | ICD-10-CM | POA: Diagnosis present

## 2023-06-30 DIAGNOSIS — F3178 Bipolar disorder, in full remission, most recent episode mixed: Secondary | ICD-10-CM | POA: Diagnosis not present

## 2023-06-30 MED ORDER — DICYCLOMINE HCL 20 MG PO TABS: 20.0000 mg | ORAL_TABLET | Freq: Four times a day (QID) | ORAL | Status: DC | PRN

## 2023-06-30 MED ORDER — HYDROXYZINE HCL 25 MG PO TABS: 25.0000 mg | ORAL_TABLET | Freq: Four times a day (QID) | ORAL | Status: DC | PRN

## 2023-06-30 MED ORDER — ONDANSETRON 4 MG PO TBDP: 4.0000 mg | ORAL_TABLET | Freq: Four times a day (QID) | ORAL | Status: DC | PRN

## 2023-06-30 MED ORDER — HYDRALAZINE HCL 25 MG PO TABS
50.0000 mg | ORAL_TABLET | Freq: Three times a day (TID) | ORAL | Status: DC
  Administered 2023-06-30 – 2023-07-01 (×3): 50 mg via ORAL
  Filled 2023-06-30 (×3): qty 2

## 2023-06-30 MED ORDER — LOPERAMIDE HCL 2 MG PO CAPS: 2.0000 mg | ORAL_CAPSULE | ORAL | Status: DC | PRN

## 2023-06-30 MED ORDER — METHOCARBAMOL 500 MG PO TABS: 500.0000 mg | ORAL_TABLET | Freq: Three times a day (TID) | ORAL | Status: DC | PRN

## 2023-06-30 MED ORDER — CLONIDINE HCL 0.2 MG PO TABS: 0.1000 mg | ORAL_TABLET | Freq: Once | ORAL | Status: DC

## 2023-06-30 MED ORDER — OLANZAPINE 5 MG PO TABS
5.0000 mg | ORAL_TABLET | Freq: Two times a day (BID) | ORAL | Status: DC
  Administered 2023-06-30 – 2023-07-01 (×3): 5 mg via ORAL
  Filled 2023-06-30 (×3): qty 1

## 2023-06-30 MED ORDER — CLONIDINE HCL 0.2 MG PO TABS
0.1000 mg | ORAL_TABLET | Freq: Every day | ORAL | Status: DC
  Administered 2023-06-30 – 2023-07-01 (×2): 0.1 mg via ORAL
  Filled 2023-06-30 (×2): qty 1

## 2023-06-30 MED ORDER — NAPROXEN 250 MG PO TABS
500.0000 mg | ORAL_TABLET | Freq: Two times a day (BID) | ORAL | Status: DC | PRN
  Administered 2023-07-01: 500 mg via ORAL
  Filled 2023-06-30: qty 2

## 2023-06-30 NOTE — BH Assessment (Addendum)
Patient seen 06/29/23 at [facility].  PER Vernard Gambles, NP "Patient will be transferred to the [facility] emergency department for medical clearance.  Dr. Karene Fry is the accepting MD.  Please notify [facility] when patient is medically cleared.  Patient has been recommended for inpatient psychiatric admission.  [facility] notified and patient has been accepted to [facility] today."

## 2023-06-30 NOTE — Progress Notes (Signed)
Patient has been denied by [facility] due to no appropriate beds available. Patient meets [facility] inpatient criteria per Arsenio Loader, NP. Patient has been faxed out to the following facilities:   [facility]  [address] [place] [place] [address] [phone]  [facility]  [address] [place] [address] [phone]  [facility]  [address] [place] [address] [phone]  [facility]  [address] [place] [address] [phone]  [facility]  [address] [place] [address] [phone]  [facility]  [address] [place] [address] [phone]  [facility]  [address] [place] [address] [phone]  [facility]  [address], [place] [address] [phone]  [facility]  [address] [place], [place] [address] [phone]  [facility]  [address] [place], [place] [address] [phone]  [facility]  [address] [place] [address] [phone]  [facility]  [address] [place] [address] [phone]  [facility]  [address], [place] [address] [phone]  [facility]  [address], [place] [address] [phone]   Damita Dunnings, MSW, LCSW-A  11:10 PM 06/30/2023

## 2023-06-30 NOTE — ED Notes (Signed)
Patient transferred to Purple zone A&Ox 4; pt dressed in purple scrubs and belongings placed in locker #5; Pt's BP still elevated but PO med given prior to transfer to unit; RN will continue to monitor thorough out the night to determine if appropriate for transfer; Patient signed voluntary consent forms for [facility]; Pt is ok with staying for the night to stabilize pressure and to transfer first thing the AM To [facility]; Pt c/o bilateral knee pain that is chronic and standing order placed for Ib profen per patient request-Monique,RN

## 2023-06-30 NOTE — Consult Note (Cosign Needed Addendum)
BH ED ASSESSMENT   Reason for Consult:  Psych Consult  Referring Physician:   Rondel Baton, MD   Patient Identification: Luvada Salamone Kooyman MRN:  [id number] ED Chief Complaint: Bipolar I disorder, most recent episode mixed (HCC)  Diagnosis:  Principal Problem:   Bipolar I disorder, most recent episode mixed (HCC) Active Problems:   Opioid use disorder   ED Assessment Time Calculation: Start Time: 1050 Stop Time: 1116 Total Time in Minutes (Assessment Completion): 26   Subjective:    Krystyn Picking Crumbley is a 42 y.o. caucasian female with a past psychiatric history of PTSD, bipolar affective disorder with periods of mania and depression, anxiety, ADHD, polysubstance abuse (though largely opiates use), and substance induced mood disorder, with pertinent medical comorbidities that include hypertension, and additional past and pertinent psychiatric history of multiple hospitalizations for decompensation of the patient's mental health, who presents this encounter after initially arriving to the [facility] for psychiatric complaints of relapse on opiates in the form of fentanyl and mixed bipolar symptomology, but had to be transferred to the [facility] Emergency department d/t significantly elevated and abnormal vital signs.  Patient currently voluntary and notably medically cleared by EDP team.  HPI:    Patient seen today at [facility] emergency department for face-to-face psychiatric evaluation.  Patient tells me that she recently yesterday relapsed on utilizing opiates in the form of fentanyl, states that she took it in an attempt to obtain formal inpatient hospital treatment, states that she has been having a very difficult time lately with her bipolar symptomology she experiences she states "on and off very frequently".  Patient tells me that "probably" every other week that she is going through periods of not sleeping, having racing thoughts, and feeling very hyperactive, then will "crash" and  experience very difficult depression.  Patient states that she however is able to sleep a couple hours most nights when she utilizes the methadone that she is prescribed through [facility] in [place], but states that she really wants to get back on psychiatric medications to control her bipolar symptoms, states that she has no outpatient psychiatric services, states that she has not had outpatient psychiatric care since 2015.   Patient reports that for her bipolar symptoms she has done poorly on a variety of medications, specifically tells me Abilify caused tardive dyskinesia and Tegretol and Depakote caused hair loss, notably presents during our engagement with what appears to be involuntary movements of her right foot. Patient is unable to recall tardive dyskinesia medicine she has taken in the past, but wants to potentially have this addressed during inpatient hospitalization as well. Patient endorses no history of suicidal attempts and/or self injures behavior, past or present.  Patient affirms large history of multiple hospitalizations for decompensation of her bipolar symptoms and substance abuse.  Patient endorses no homicidal ideations.  Patient endorses no psychotic features, and objectively, does not appear to be experiencing auditory visual hallucinations and/or internal stimuli.  Patient does presents meaningfully with depressed and anxious mood, pressured and difficult to interrupt speech, atypical and animated interpersonal style, and mild hyperactivity. Patient denies paranoia.  Patient orientation is intact upon assessment.  Patient endorses no withdrawal symptomology from opiate use, however, reports her last dose of methadone was yesterday at the clinic.  Patient reports that she has secure housing and financial funds, reports that she lives with her dad and has mental health disability income.  Patient reports no current or recent use of other substances outside of  recent  fentanyl use, denies EtOH use currently, endorses daily tobacco use of 1 cigarette/day.  Patient reports a vague substance abuse history of "I have used just about everything in the past".  Reports has been on methadone treatment through the clinic for approximately 3 to 4 years now.   Past Psychiatric History: As above  Risk to Self or Others: Is the patient at risk to self? No Has the patient been a risk to self in the past 6 months? No Has the patient been a risk to self within the distant past? Yes Is the patient a risk to others? No Has the patient been a risk to others in the past 6 months? No Has the patient been a risk to others within the distant past? No  [place] Scale:  Flowsheet Row ED from 06/29/2023 in [facility] Emergency Department at [facility] Most recent reading at 06/29/2023 11:00 PM ED from 06/29/2023 in [facility] Most recent reading at 06/29/2023  6:03 PM ED from 10/13/2022 in [facility] Most recent reading at 10/13/2022  6:59 PM  C-SSRS RISK CATEGORY No Risk No Risk No Risk       Substance Abuse:  "I have used just about everything in the past", reports long history of opiate use (currently on methadone treatment x 3 years), infrequent EtOH use, daily tobacco use  AIMS: 5   Past Medical History:  Past Medical History:  Diagnosis Date   ADHD (attention deficit hyperactivity disorder)    Alcohol abuse    Anxiety    Bipolar 1 disorder (HCC)    Borderline systolic HTN    Depression    GERD (gastroesophageal reflux disease)    Heroin abuse (HCC)    Hypertension    Kidney stone    Knee pain, bilateral    - patellofemoral syndrome, followed by Dr. [name] at [facility]   Migraines    Migraines    Polysubstance abuse (HCC)    - Hx ETOH, cocaine, THC, IV heroin, narcotics   Psychiatric care    Previous BH H&P notes PMHx Schizophrenia, though patient denies this.   Seizures (HCC)     Past  Surgical History:  Procedure Laterality Date   KNEE SURGERY     SHOULDER SURGERY     Family History:  Family History  Problem Relation Age of Onset   Hypertension Father    Family Psychiatric  History: None endorsed Social History:  Social History   Substance and Sexual Activity  Alcohol Use No     Social History   Substance and Sexual Activity  Drug Use No   Types: IV   Comment: methadone     Social History   Socioeconomic History   Marital status: Single    Spouse name: Not on file   Number of children: Not on file   Years of education: Not on file   Highest education level: Not on file  Occupational History   Not on file  Tobacco Use   Smoking status: Former    Current packs/day: 0.50    Types: Cigarettes   Smokeless tobacco: Never  Vaping Use   Vaping status: Never Used  Substance and Sexual Activity   Alcohol use: No   Drug use: No    Types: IV    Comment: methadone    Sexual activity: Not on file  Other Topics Concern   Not on file  Social History Narrative   Not on file  Social Determinants of Health   Financial Resource Strain: Medium Risk (09/08/2022)   Received from [facility]   Overall Financial Resource Strain (CARDIA)    Difficulty of Paying Living Expenses: Somewhat hard  Food Insecurity: No Food Insecurity (06/29/2023)   Hunger Vital Sign    Worried About Running Out of Food in the Last Year: Never true    Ran Out of Food in the Last Year: Never true  Transportation Needs: No Transportation Needs (06/29/2023)   PRAPARE - Administrator, Civil Service (Medical): No    Lack of Transportation (Non-Medical): No  Physical Activity: Not on file  Stress: No Stress Concern Present (09/08/2022)   Received from [facility], [facility] - Occupational Stress Questionnaire    Feeling of Stress : Only a little  Social Connections: Unknown (09/07/2022)   Received from  [facility], [facility]   Social Network    Social Network: Not on file   Additional Social History:    Allergies:   Allergies  Allergen Reactions   Shellfish Allergy Anaphylaxis and Hives   Meat Extract     Pt is vegetarian    Labs:  Results for orders placed or performed during the hospital encounter of 06/29/23 (from the past 48 hour(s))  Urine rapid drug screen (hosp performed)     Status: None   Collection Time: 06/29/23  5:11 PM  Result Value Ref Range   Opiates NONE DETECTED NONE DETECTED   Cocaine NONE DETECTED NONE DETECTED   Benzodiazepines NONE DETECTED NONE DETECTED   Amphetamines NONE DETECTED NONE DETECTED   Tetrahydrocannabinol NONE DETECTED NONE DETECTED   Barbiturates NONE DETECTED NONE DETECTED    Comment: (NOTE) DRUG SCREEN FOR MEDICAL PURPOSES ONLY.  IF CONFIRMATION IS NEEDED FOR ANY PURPOSE, NOTIFY LAB WITHIN 5 DAYS.  LOWEST DETECTABLE LIMITS FOR URINE DRUG SCREEN Drug Class                     Cutoff (ng/mL) Amphetamine and metabolites    1000 Barbiturate and metabolites    200 Benzodiazepine                 200 Opiates and metabolites        300 Cocaine and metabolites        300 THC                            50 Performed at [facility] Lab, [address] [place], [place] [address]   Comprehensive metabolic panel     Status: Abnormal   Collection Time: 06/29/23  9:18 PM  Result Value Ref Range   Sodium 138 135 - 145 mmol/L   Potassium 3.9 3.5 - 5.1 mmol/L   Chloride 103 98 - 111 mmol/L   CO2 25 22 - 32 mmol/L   Glucose, Bld 106 (H) 70 - 99 mg/dL    Comment: Glucose reference range applies only to samples taken after fasting for at least 8 hours.   BUN 9 6 - 20 mg/dL   Creatinine, Ser 1.19 0.44 - 1.00 mg/dL   Calcium 9.2 8.9 - 14.7 mg/dL   Total Protein 7.6 6.5 - 8.1 g/dL   Albumin 3.7 3.5 - 5.0 g/dL   AST 55 (H) 15 - 41 U/L   ALT 58 (H) 0 - 44 U/L   Alkaline Phosphatase 87 38 -  126 U/L   Total Bilirubin <0.1 (L) 0.3 - 1.2  mg/dL   GFR, Estimated >40 >98 mL/min    Comment: (NOTE) Calculated using the CKD-EPI Creatinine Equation (2021)    Anion gap 10 5 - 15    Comment: Performed at [facility], [address] [place], [place] [address]  Ethanol     Status: None   Collection Time: 06/29/23  9:18 PM  Result Value Ref Range   Alcohol, Ethyl (B) <10 <10 mg/dL    Comment: (NOTE) Lowest detectable limit for serum alcohol is 10 mg/dL.  For medical purposes only. Performed at [facility], [address] [place], [place] [address]   CBC with Diff     Status: Abnormal   Collection Time: 06/29/23  9:18 PM  Result Value Ref Range   WBC 14.9 (H) 4.0 - 10.5 K/uL   RBC 5.53 (H) 3.87 - 5.11 MIL/uL   Hemoglobin 14.1 12.0 - 15.0 g/dL   HCT 62.1 30.8 - 65.7 %   MCV 80.5 80.0 - 100.0 fL   MCH 25.5 (L) 26.0 - 34.0 pg   MCHC 31.7 30.0 - 36.0 g/dL   RDW 84.6 96.2 - 95.2 %   Platelets 349 150 - 400 K/uL   nRBC 0.0 0.0 - 0.2 %   Neutrophils Relative % 73 %   Neutro Abs 11.1 (H) 1.7 - 7.7 K/uL   Lymphocytes Relative 17 %   Lymphs Abs 2.5 0.7 - 4.0 K/uL   Monocytes Relative 7 %   Monocytes Absolute 1.0 0.1 - 1.0 K/uL   Eosinophils Relative 1 %   Eosinophils Absolute 0.1 0.0 - 0.5 K/uL   Basophils Relative 1 %   Basophils Absolute 0.1 0.0 - 0.1 K/uL   Immature Granulocytes 1 %   Abs Immature Granulocytes 0.08 (H) 0.00 - 0.07 K/uL    Comment: Performed at [facility], [address] [place], [place] [address]  Salicylate level     Status: Abnormal   Collection Time: 06/29/23  9:18 PM  Result Value Ref Range   Salicylate Lvl <7.0 (L) 7.0 - 30.0 mg/dL    Comment: Performed at [facility], [address], [place], [place] [address]  Acetaminophen level     Status: Abnormal   Collection Time: 06/29/23  9:18 PM  Result Value Ref Range   Acetaminophen (Tylenol), Serum <10 (L) 10 - 30 ug/mL    Comment: (NOTE) Therapeutic concentrations vary significantly. A range of 10-30 ug/mL  may be  an effective concentration for many patients. However, some  are best treated at concentrations outside of this range. Acetaminophen concentrations >150 ug/mL at 4 hours after ingestion  and >50 ug/mL at 12 hours after ingestion are often associated with  toxic reactions.  Performed at [facility], [address], [place], [place] [address]     Current Facility-Administered Medications  Medication Dose Route Frequency Provider Last Rate Last Admin   cloNIDine (CATAPRES) tablet 0.1 mg  0.1 mg Oral Daily Estanislado Pandy J, DO   0.1 mg at 06/30/23 1130   dicyclomine (BENTYL) tablet 20 mg  20 mg Oral Q6H PRN Lenox Ponds, NP       hydrALAZINE (APRESOLINE) tablet 25 mg  25 mg Oral TID Rondel Baton, MD   25 mg at 06/30/23 6644   hydrOXYzine (ATARAX) tablet 25 mg  25 mg Oral Q6H PRN Lenox Ponds, NP  loperamide (IMODIUM) capsule 2-4 mg  2-4 mg Oral PRN Lenox Ponds, NP       methocarbamol (ROBAXIN) tablet 500 mg  500 mg Oral Q8H PRN Lenox Ponds, NP       naproxen (NAPROSYN) tablet 500 mg  500 mg Oral BID PRN Lenox Ponds, NP       OLANZapine (ZYPREXA) tablet 5 mg  5 mg Oral BID Lenox Ponds, NP       ondansetron (ZOFRAN-ODT) disintegrating tablet 4 mg  4 mg Oral Q6H PRN Lenox Ponds, NP       Current Outpatient Medications  Medication Sig Dispense Refill   amLODipine (NORVASC) 10 MG tablet Take 1 tablet by mouth daily. (Patient not taking: Reported on 06/30/2023)     methadone (DOLOPHINE) 10 MG/ML solution Take 80 mg by mouth daily.     OLANZapine zydis (ZYPREXA) 5 MG disintegrating tablet Take 1 tablet (5 mg total) by mouth 2 (two) times daily. (Patient not taking: Reported on 06/30/2023) 30 tablet 0    Musculoskeletal: Strength & Muscle Tone: increased Gait & Station: normal Patient leans: N/A   Psychiatric Specialty Exam: Presentation  General Appearance:  Disheveled; Other (comment) (In scrubs)  Eye Contact: Other (comment) (Variable  to animated)  Speech: Pressured  Speech Volume: Normal  Handedness: Right   Mood and Affect  Mood: Anxious; Depressed  Affect: Other (comment) (animated)   Thought Process  Thought Processes: Goal Directed; Linear; Coherent  Descriptions of Associations:Intact  Orientation:Full (Time, Place and Person)  Thought Content:Logical; Other (comment) (Mildly scattered)  History of Schizophrenia/Schizoaffective disorder:No  Duration of Psychotic Symptoms:Less than six months  Hallucinations:Hallucinations: None  Ideas of Reference:None  Suicidal Thoughts:Suicidal Thoughts: No  Homicidal Thoughts:Homicidal Thoughts: No   Sensorium  Memory: Immediate Fair; Recent Fair; Remote Fair  Judgment: Intact  Insight: Present   Executive Functions  Concentration: Fair  Attention Span: Fair  Recall: [employer] of Knowledge: Fair  Language: Fair   Psychomotor Activity  Psychomotor Activity: Psychomotor Activity: Extrapyramidal Side Effects (EPS) Extrapyramidal Side Effects (EPS): Tardive Dyskinesia AIMS Completed?: No   Assets  Assets: Desire for Improvement; Financial Resources/Insurance; Housing; Leisure Time; Physical Health; Resilience; Social Support; Talents/Skills; Investment banker, corporate; Communication Skills    Sleep  Sleep: Sleep: Poor Number of Hours of Sleep: 0   Physical Exam: Physical Exam Vitals and nursing note reviewed.  Constitutional:      General: She is not in acute distress.    Appearance: She is obese. She is not ill-appearing, toxic-appearing or diaphoretic.  Pulmonary:     Effort: Pulmonary effort is normal.  Neurological:     Mental Status: She is alert and oriented to person, place, and time.     Comments: Involuntary movement of right foot   Psychiatric:        Attention and Perception: She is inattentive. She does not perceive auditory or visual hallucinations.        Mood and Affect: Mood is  anxious and depressed.        Speech: Speech is rapid and pressured.        Behavior: Behavior is hyperactive. Behavior is cooperative.        Thought Content: Thought content is not paranoid or delusional. Thought content does not include homicidal or suicidal ideation.    Review of Systems  Psychiatric/Behavioral:  Positive for depression and substance abuse. Negative for hallucinations and suicidal ideas. The patient is nervous/anxious and has insomnia.    Blood  pressure (!) 162/102, pulse 86, temperature 98.4 F (36.9 C), temperature source Oral, resp. rate 18, height 5\' 6"  (1.676 m), SpO2 95%. Body mass index is 37.12 kg/m.  Medical Decision Making:  Diagnostically, the patient appears to be presenting with symptomology that is most consistent with the patient's current and historical diagnosis of bipolar 1 disorder with most recent episode being mixed.  Patient endorses that she has been experiencing significant trouble with sleep, hyperactivity, depressed anxious mood, and a variety of other difficult and concerning symptomology, states that she has a desire to be started on psychiatric medications to help her function.  Patient does not meet involuntary commitment criteria at this time, but is like aformentioned, amenable to treatment and has desire to start medications that this Clinical research associate and patient discussed.  Patient has been rejected at this time from being transferred to [facility] due to elevated and uncontrolled hypertension at this time, will have CSW team send patient out to see if additional disposition can be obtained.  Patient will continue to be followed by psychiatry until disposition is obtained.  Recommendations   #Bipolar I disorder, most recent episode mixed (HCC)  -Recommend inpatient hospitalization, voluntarily -Recommend olanzapine p.o. 5 mg twice daily -Recommend safety precautions  #Opioid use disorder -Recommend/consider restarting methadone 100 mg p.o. when  able to verify through patient's clinic -Recommend COWS protocol/monitoring  #Tardive dyskinesia -Aims: 5 -Recommend/consider neurology for pharmacological intervention    Disposition: Recommend psychiatric Inpatient admission when medically cleared.  Lenox Ponds, NP 06/30/2023 12:24 PM

## 2023-06-30 NOTE — ED Notes (Signed)
Messaged pharmacy tech that a STAT med reconciliation needs done for pt to be able to receive her methadone.

## 2023-06-30 NOTE — ED Provider Notes (Addendum)
Emergency Medicine Observation Re-evaluation Note  [name] is a 42 y.o. female, seen on rounds today.  Pt initially presented to the ED for complaints of Withdrawal Currently, the patient is no distress. Headache from yesterday improving. BP remaines elevated, but improved compared to presentation.   Physical Exam  BP (!) 172/112 (BP Location: Left Arm)   Pulse 87   Temp 98.5 F (36.9 C) (Oral)   Resp 16   Ht 5\' 6"  (1.676 m)   SpO2 94%   BMI 37.12 kg/m  Physical Exam General: Alert Cardiac: Regular rate  Lungs: Not in distress, equal and unlabored Psych: Calm and cooperative  ED Course / MDM  EKG:   I have reviewed the labs performed to date as well as medications administered while in observation.  Plan  Current plan is for inpatient Psych; however BP must be <150/100. BP 162/102 currently. Will add clonidine which should help BP and withdrawal symptoms.     Coral Spikes, DO 06/30/23 2130    Coral Spikes, DO 06/30/23 1121

## 2023-06-30 NOTE — ED Notes (Signed)
Per Gigi RN at [facility], pt is not safe to be accepted to their unit at this time d/t elevated BP. EDP and BH team aware.

## 2023-06-30 NOTE — Progress Notes (Signed)
Pt was accepted to [facility] TODAY 06/30/2023. Bed assignment: 309  Pt meets inpatient criteria per Vernard Gambles, NP  Attending Physician will be Elane Fritz, DO  Report can be called to: [phone]  Pt can arrive anytime today  Care Team Notified: [facility] Rona Ravens, RN, Denton Ar, RN, and Arsenio Loader, NP  Cathie Beams, LCSW  06/30/2023 9:49 AM

## 2023-07-01 ENCOUNTER — Encounter: Payer: Self-pay | Admitting: Psychiatry

## 2023-07-01 ENCOUNTER — Encounter (HOSPITAL_COMMUNITY): Payer: Self-pay | Admitting: Psychiatry

## 2023-07-01 ENCOUNTER — Other Ambulatory Visit: Payer: Self-pay

## 2023-07-01 ENCOUNTER — Inpatient Hospital Stay
Admission: AD | Admit: 2023-07-01 | Discharge: 2023-07-06 | DRG: 885 | Disposition: A | Discharge status: Home / Self Care | Payer: 59 | Source: Intra-hospital | Attending: Psychiatry | Admitting: Psychiatry

## 2023-07-01 DIAGNOSIS — F4310 Post-traumatic stress disorder, unspecified: Secondary | ICD-10-CM | POA: Diagnosis present

## 2023-07-01 DIAGNOSIS — Z79899 Other long term (current) drug therapy: Secondary | ICD-10-CM

## 2023-07-01 DIAGNOSIS — Z87891 Personal history of nicotine dependence: Secondary | ICD-10-CM

## 2023-07-01 DIAGNOSIS — F411 Generalized anxiety disorder: Secondary | ICD-10-CM | POA: Diagnosis present

## 2023-07-01 DIAGNOSIS — Z8249 Family history of ischemic heart disease and other diseases of the circulatory system: Secondary | ICD-10-CM | POA: Diagnosis not present

## 2023-07-01 DIAGNOSIS — G4700 Insomnia, unspecified: Secondary | ICD-10-CM | POA: Diagnosis present

## 2023-07-01 DIAGNOSIS — F1190 Opioid use, unspecified, uncomplicated: Secondary | ICD-10-CM | POA: Diagnosis not present

## 2023-07-01 DIAGNOSIS — Z5986 Financial insecurity: Secondary | ICD-10-CM

## 2023-07-01 DIAGNOSIS — I10 Essential (primary) hypertension: Secondary | ICD-10-CM | POA: Diagnosis present

## 2023-07-01 DIAGNOSIS — F3160 Bipolar disorder, current episode mixed, unspecified: Secondary | ICD-10-CM | POA: Diagnosis not present

## 2023-07-01 DIAGNOSIS — F1120 Opioid dependence, uncomplicated: Secondary | ICD-10-CM | POA: Diagnosis present

## 2023-07-01 DIAGNOSIS — F909 Attention-deficit hyperactivity disorder, unspecified type: Secondary | ICD-10-CM | POA: Diagnosis present

## 2023-07-01 DIAGNOSIS — F3163 Bipolar disorder, current episode mixed, severe, without psychotic features: Secondary | ICD-10-CM | POA: Diagnosis present

## 2023-07-01 DIAGNOSIS — F3178 Bipolar disorder, in full remission, most recent episode mixed: Secondary | ICD-10-CM | POA: Diagnosis not present

## 2023-07-01 MED ORDER — HYDRALAZINE HCL 50 MG PO TABS
50.0000 mg | ORAL_TABLET | Freq: Three times a day (TID) | ORAL | Status: DC
  Administered 2023-07-01 – 2023-07-06 (×15): 50 mg via ORAL
  Filled 2023-07-01 (×16): qty 1

## 2023-07-01 MED ORDER — DICYCLOMINE HCL 20 MG PO TABS
20.0000 mg | ORAL_TABLET | Freq: Four times a day (QID) | ORAL | Status: AC | PRN
  Administered 2023-07-04: 20 mg via ORAL
  Filled 2023-07-01 (×2): qty 1

## 2023-07-01 MED ORDER — METHOCARBAMOL 500 MG PO TABS
500.0000 mg | ORAL_TABLET | Freq: Three times a day (TID) | ORAL | Status: AC | PRN
  Administered 2023-07-03: 500 mg via ORAL
  Filled 2023-07-01: qty 1

## 2023-07-01 MED ORDER — ACETAMINOPHEN 325 MG PO TABS
650.0000 mg | ORAL_TABLET | Freq: Four times a day (QID) | ORAL | Status: DC | PRN
  Administered 2023-07-01 – 2023-07-05 (×6): 650 mg via ORAL
  Filled 2023-07-01 (×6): qty 2

## 2023-07-01 MED ORDER — METHADONE HCL 10 MG PO TABS
100.0000 mg | ORAL_TABLET | Freq: Every day | ORAL | Status: DC
  Administered 2023-07-01: 100 mg via ORAL
  Filled 2023-07-01: qty 10

## 2023-07-01 MED ORDER — HYDROXYZINE HCL 25 MG PO TABS
25.0000 mg | ORAL_TABLET | Freq: Four times a day (QID) | ORAL | Status: AC | PRN
  Administered 2023-07-03 – 2023-07-04 (×2): 25 mg via ORAL
  Filled 2023-07-01 (×2): qty 1

## 2023-07-01 MED ORDER — HALOPERIDOL LACTATE 5 MG/ML IJ SOLN: 5.0000 mg | Freq: Three times a day (TID) | INTRAMUSCULAR | Status: DC | PRN

## 2023-07-01 MED ORDER — METHADONE HCL 10 MG PO TABS
100.0000 mg | ORAL_TABLET | Freq: Every day | ORAL | Status: DC
  Administered 2023-07-02 – 2023-07-06 (×5): 100 mg via ORAL
  Filled 2023-07-01 (×5): qty 10

## 2023-07-01 MED ORDER — NICOTINE 14 MG/24HR TD PT24
14.0000 mg | MEDICATED_PATCH | Freq: Once | TRANSDERMAL | Status: DC
  Administered 2023-07-01: 14 mg via TRANSDERMAL
  Filled 2023-07-01: qty 1

## 2023-07-01 MED ORDER — LORAZEPAM 2 MG/ML IJ SOLN: 2.0000 mg | Freq: Three times a day (TID) | INTRAMUSCULAR | Status: DC | PRN

## 2023-07-01 MED ORDER — LOPERAMIDE HCL 2 MG PO CAPS: 2.0000 mg | ORAL_CAPSULE | ORAL | Status: AC | PRN

## 2023-07-01 MED ORDER — NAPROXEN 500 MG PO TABS: 500.0000 mg | ORAL_TABLET | Freq: Two times a day (BID) | ORAL | Status: AC | PRN

## 2023-07-01 MED ORDER — OLANZAPINE 5 MG PO TABS
5.0000 mg | ORAL_TABLET | Freq: Two times a day (BID) | ORAL | Status: DC
  Administered 2023-07-01 – 2023-07-02 (×2): 5 mg via ORAL
  Filled 2023-07-01 (×2): qty 1

## 2023-07-01 MED ORDER — CLONIDINE HCL 0.1 MG PO TABS
0.1000 mg | ORAL_TABLET | Freq: Every day | ORAL | Status: DC
  Administered 2023-07-02: 0.1 mg via ORAL
  Filled 2023-07-01: qty 1

## 2023-07-01 MED ORDER — ALUM & MAG HYDROXIDE-SIMETH 200-200-20 MG/5ML PO SUSP
30.0000 mL | ORAL | Status: DC | PRN
  Administered 2023-07-04: 30 mL via ORAL
  Filled 2023-07-01: qty 30

## 2023-07-01 MED ORDER — AMLODIPINE BESYLATE 5 MG PO TABS
5.0000 mg | ORAL_TABLET | Freq: Every day | ORAL | Status: DC
  Administered 2023-07-01: 5 mg via ORAL
  Filled 2023-07-01: qty 1

## 2023-07-01 MED ORDER — NICOTINE 14 MG/24HR TD PT24
14.0000 mg | MEDICATED_PATCH | Freq: Every day | TRANSDERMAL | Status: DC
  Administered 2023-07-02 – 2023-07-06 (×5): 14 mg via TRANSDERMAL
  Filled 2023-07-01 (×5): qty 1

## 2023-07-01 MED ORDER — TRAZODONE HCL 50 MG PO TABS: 50.0000 mg | ORAL_TABLET | Freq: Every evening | ORAL | Status: DC | PRN

## 2023-07-01 MED ORDER — HALOPERIDOL 5 MG PO TABS: 5.0000 mg | ORAL_TABLET | Freq: Three times a day (TID) | ORAL | Status: DC | PRN

## 2023-07-01 MED ORDER — LORAZEPAM 2 MG PO TABS: 2.0000 mg | ORAL_TABLET | Freq: Three times a day (TID) | ORAL | Status: DC | PRN

## 2023-07-01 MED ORDER — DIPHENHYDRAMINE HCL 50 MG/ML IJ SOLN: 50.0000 mg | Freq: Three times a day (TID) | INTRAMUSCULAR | Status: DC | PRN

## 2023-07-01 MED ORDER — AMLODIPINE BESYLATE 5 MG PO TABS
5.0000 mg | ORAL_TABLET | Freq: Every day | ORAL | Status: DC
  Administered 2023-07-02 – 2023-07-06 (×5): 5 mg via ORAL
  Filled 2023-07-01 (×5): qty 1

## 2023-07-01 MED ORDER — MAGNESIUM HYDROXIDE 400 MG/5ML PO SUSP
30.0000 mL | Freq: Every day | ORAL | Status: DC | PRN
  Administered 2023-07-04: 30 mL via ORAL
  Filled 2023-07-01: qty 30

## 2023-07-01 MED ORDER — ONDANSETRON 4 MG PO TBDP
4.0000 mg | ORAL_TABLET | Freq: Four times a day (QID) | ORAL | Status: AC | PRN
  Administered 2023-07-04: 4 mg via ORAL
  Filled 2023-07-01: qty 1

## 2023-07-01 MED ORDER — DIPHENHYDRAMINE HCL 25 MG PO CAPS: 50.0000 mg | ORAL_CAPSULE | Freq: Three times a day (TID) | ORAL | Status: DC | PRN

## 2023-07-01 NOTE — ED Provider Notes (Signed)
Emergency Medicine Observation Re-evaluation Note  [name] is a 42 y.o. female.   Pt initially presented to the ED for complaints of Withdrawal Currently, the patient is sleeping.  Physical Exam  BP (!) 162/107 (BP Location: Left Arm)   Pulse 84   Temp 98.4 F (36.9 C) (Oral)   Resp 17   Ht 1.676 m (5\' 6" )   SpO2 94%   BMI 37.12 kg/m  Physical Exam General: nad Cardiac: regular rate Lungs: normal effort   ED Course / MDM  EKG:   I have reviewed the labs performed to date as well as medications administered while in observation.  Recent changes in the last 24 hours include BP elevated, although decreasgin.  Plan  Current plan is for inpt psych.  Will add norvasc to meds    Linwood Dibbles, MD 07/01/23 [phone]

## 2023-07-01 NOTE — Tx Team (Signed)
Initial Treatment Plan 07/01/2023 6:06 PM [name] [id number]    PATIENT STRESSORS: Health problems   Substance abuse     PATIENT STRENGTHS: Ability for insight  Average or above average intelligence  Capable of independent living  Supportive family/friends    PATIENT IDENTIFIED PROBLEMS: Depression  Wants therapy   adhd                 DISCHARGE CRITERIA:  Ability to meet basic life and health needs Adequate post-discharge living arrangements Improved stabilization in mood, thinking, and/or behavior Medical problems require only outpatient monitoring Motivation to continue treatment in a less acute level of care Need for constant or close observation no longer present Safe-care adequate arrangements made Verbal commitment to aftercare and medication compliance Withdrawal symptoms are absent or subacute and managed without 24-hour nursing intervention  PRELIMINARY DISCHARGE PLAN:   PATIENT/FAMILY INVOLVEMENT: This treatment plan has been presented to and reviewed with the patient, [name] .  The patient and family have been given the opportunity to ask questions and make suggestions.  Bonnita Hollow, RN 07/01/2023, 6:06 PM

## 2023-07-01 NOTE — Group Note (Signed)
[facility] LCSW Group Therapy Note   Group Date: 07/01/2023 Start Time: 1330 End Time: 1430   Type of Therapy/Topic:  Group Therapy:  Emotion Regulation  Participation Level:  Did Not Attend   Mood:  Description of Group:    The purpose of this group is to assist patients in learning to regulate negative emotions and experience positive emotions. Patients will be guided to discuss ways in which they have been vulnerable to their negative emotions. These vulnerabilities will be juxtaposed with experiences of positive emotions or situations, and patients challenged to use positive emotions to combat negative ones. Special emphasis will be placed on coping with negative emotions in conflict situations, and patients will process healthy conflict resolution skills.  Therapeutic Goals: Patient will identify two positive emotions or experiences to reflect on in order to balance out negative emotions:  Patient will label two or more emotions that they find the most difficult to experience:  Patient will be able to demonstrate positive conflict resolution skills through discussion or role plays:   Summary of Patient Progress: X  Therapeutic Modalities:   Cognitive Behavioral Therapy Feelings Identification Dialectical Behavioral Therapy   Harden Mo, LCSW

## 2023-07-01 NOTE — Plan of Care (Signed)
New Patient Problem: Education: Goal: Knowledge of [place] [facility] Education information/materials will improve Outcome: Not Applicable Goal: Emotional status will improve Outcome: Not Applicable Goal: Mental status will improve Outcome: Not Applicable Goal: Verbalization of understanding the information provided will improve Outcome: Not Applicable   Problem: Activity: Goal: Interest or engagement in activities will improve Outcome: Not Applicable Goal: Sleeping patterns will improve Outcome: Not Applicable   Problem: Coping: Goal: Ability to verbalize frustrations and anger appropriately will improve Outcome: Not Applicable Goal: Ability to demonstrate self-control will improve Outcome: Not Applicable   Problem: Health Behavior/Discharge Planning: Goal: Identification of resources available to assist in meeting health care needs will improve Outcome: Not Applicable Goal: Compliance with treatment plan for underlying cause of condition will improve Outcome: Not Applicable   Problem: Physical Regulation: Goal: Ability to maintain clinical measurements within normal limits will improve Outcome: Not Applicable   Problem: Safety: Goal: Periods of time without injury will increase Outcome: Not Applicable   Problem: Education: Goal: Knowledge of General Education information will improve Description: Including pain rating scale, medication(s)/side effects and non-pharmacologic comfort measures Outcome: Not Applicable   Problem: Health Behavior/Discharge Planning: Goal: Ability to manage health-related needs will improve Outcome: Not Applicable   Problem: Clinical Measurements: Goal: Ability to maintain clinical measurements within normal limits will improve Outcome: Not Applicable Goal: Will remain free from infection Outcome: Not Applicable Goal: Diagnostic test results will improve Outcome: Not Applicable Goal: Respiratory complications will improve Outcome: Not  Applicable Goal: Cardiovascular complication will be avoided Outcome: Not Applicable   Problem: Activity: Goal: Risk for activity intolerance will decrease Outcome: Not Applicable   Problem: Nutrition: Goal: Adequate nutrition will be maintained Outcome: Not Applicable   Problem: Coping: Goal: Level of anxiety will decrease Outcome: Not Applicable   Problem: Elimination: Goal: Will not experience complications related to bowel motility Outcome: Not Applicable Goal: Will not experience complications related to urinary retention Outcome: Not Applicable   Problem: Pain Managment: Goal: General experience of comfort will improve Outcome: Not Applicable   Problem: Safety: Goal: Ability to remain free from injury will improve Outcome: Not Applicable   Problem: Skin Integrity: Goal: Risk for impaired skin integrity will decrease Outcome: Not Applicable   Problem: Activity: Goal: Interest or engagement in leisure activities will improve Outcome: Not Applicable Goal: Imbalance in normal sleep/wake cycle will improve Outcome: Not Applicable   Problem: Safety: Goal: Ability to disclose and discuss suicidal ideas will improve Outcome: Not Applicable Goal: Ability to identify and utilize support systems that promote safety will improve Outcome: Not Applicable   Problem: Education: Goal: Ability to state activities that reduce stress will improve Outcome: Not Applicable   Problem: Coping: Goal: Ability to identify and develop effective coping behavior will improve Outcome: Not Applicable   Problem: Self-Concept: Goal: Ability to identify factors that promote anxiety will improve Outcome: Not Applicable Goal: Level of anxiety will decrease Outcome: Not Applicable Goal: Ability to modify response to factors that promote anxiety will improve Outcome: Not Applicable

## 2023-07-01 NOTE — Group Note (Signed)
Date:  07/01/2023 Time:  8:59 PM  Group Topic/Focus:  Goals Group:   The focus of this group is to help patients establish daily goals to achieve during treatment and discuss how the patient can incorporate goal setting into their daily lives to aide in recovery.    Participation Level:  Did Not Attend   Lenore Cordia 07/01/2023, 8:59 PM

## 2023-07-01 NOTE — Progress Notes (Signed)
[facility] Psych ED Progress Note  07/01/2023 10:04 AM [name]  MRN:  [id number]   Subjective:    [name] is a 42 y.o. caucasian female with a past psychiatric history of PTSD, bipolar affective disorder with periods of mania and depression, anxiety, ADHD, polysubstance abuse (though largely opiates use), and substance induced mood disorder, with pertinent medical comorbidities that include hypertension, and additional past and pertinent psychiatric history of multiple hospitalizations for decompensation of the patient's mental health, who presents this encounter after initially arriving to the [facility] for psychiatric complaints of relapse on opiates in the form of fentanyl and mixed bipolar symptomology, but had to be transferred to the [facility] Emergency department d/t significantly elevated and abnormal vital signs.  Patient currently voluntary and notably medically cleared by EDP team.   Patient seen today for face-to-face psychiatric reevaluation at [facility] emergency department.  Upon evaluation, patient presents with endorsements of improvements in her depressed and anxious mood.  Patient endorses that over the night she was able to get "a Tracy" sleep, though continues to experience racing thoughts and difficulty with resting.  Patient denies suicidal homicidal ideations, does not endorse any psychotic features or appear to be presenting with any objectively.  Patient endorses no withdrawal symptomology from opiates.  Patient endorses she is amenable to restarting her methadone, discussed that this provider was able to verify dose through her clinic.  Principal Problem: Bipolar I disorder, most recent episode mixed (HCC) Diagnosis:  Principal Problem:   Bipolar I disorder, most recent episode mixed (HCC) Active Problems:   Opioid use disorder   ED Assessment Time Calculation: Start Time: 0930 Stop Time: 1000 Total Time in Minutes (Assessment Completion): 30   Past  Psychiatric History: As above  [place] Scale:  Flowsheet Row ED from 06/29/2023 in [facility] Emergency Department at [facility] Most recent reading at 07/01/2023  8:00 AM ED from 06/29/2023 in [facility] Most recent reading at 06/29/2023  6:03 PM ED from 10/13/2022 in [facility] Most recent reading at 10/13/2022  6:59 PM  C-SSRS RISK CATEGORY No Risk No Risk No Risk       Past Medical History:  Past Medical History:  Diagnosis Date   ADHD (attention deficit hyperactivity disorder)    Alcohol abuse    Anxiety    Bipolar 1 disorder (HCC)    Borderline systolic HTN    Depression    GERD (gastroesophageal reflux disease)    Heroin abuse (HCC)    Hypertension    Kidney stone    Knee pain, bilateral    - patellofemoral syndrome, followed by Dr. [name] at [facility]   Migraines    Migraines    Polysubstance abuse (HCC)    - Hx ETOH, cocaine, THC, IV heroin, narcotics   Psychiatric care    Previous [facility] H&P notes PMHx Schizophrenia, though patient denies this.   Seizures (HCC)     Past Surgical History:  Procedure Laterality Date   KNEE SURGERY     SHOULDER SURGERY     Family History:  Family History  Problem Relation Age of Onset   Hypertension Father    Family Psychiatric  History: As reported Social History:  Social History   Substance and Sexual Activity  Alcohol Use No     Social History   Substance and Sexual Activity  Drug Use No   Types: IV   Comment: methadone     Social History  Socioeconomic History   Marital status: Single    Spouse name: Not on file   Number of children: Not on file   Years of education: Not on file   Highest education level: Not on file  Occupational History   Not on file  Tobacco Use   Smoking status: Former    Current packs/day: 0.50    Types: Cigarettes   Smokeless tobacco: Never  Vaping Use   Vaping status: Never Used  Substance and Sexual Activity    Alcohol use: No   Drug use: No    Types: IV    Comment: methadone    Sexual activity: Not on file  Other Topics Concern   Not on file  Social History Narrative   Not on file   Social Determinants of Health   Financial Resource Strain: Medium Risk (09/08/2022)   [facility], [facility]   Overall Financial Resource Strain (CARDIA)    Difficulty of Paying Living Expenses: Somewhat hard  Food Insecurity: No Food Insecurity (06/29/2023)   Hunger Vital Sign    Worried About Running Out of Food in the Last Year: Never true    Ran Out of Food in the Last Year: Never true  Transportation Needs: No Transportation Needs (06/29/2023)   PRAPARE - Administrator, Civil Service (Medical): No    Lack of Transportation (Non-Medical): No  Physical Activity: Not on file  Stress: No Stress Concern Present (09/08/2022)   Received from [facility] - Occupational Stress Questionnaire    Feeling of Stress : Only a Tracy  Social Connections: Unknown (09/07/2022)   Received from [facility], [facility]   Social Network    Social Network: Not on file    Sleep: Poor  Appetite:  Good  Current Medications: Current Facility-Administered Medications  Medication Dose Route Frequency Provider Last Rate Last Admin   amLODipine (NORVASC) tablet 5 mg  5 mg Oral Daily Linwood Dibbles, MD   5 mg at 07/01/23 4010   cloNIDine (CATAPRES) tablet 0.1 mg  0.1 mg Oral Daily Coral Spikes, DO   0.1 mg at 07/01/23 2725   dicyclomine (BENTYL) tablet 20 mg  20 mg Oral Q6H PRN Lenox Ponds, NP       hydrALAZINE (APRESOLINE) tablet 50 mg  50 mg Oral TID Coral Spikes, DO   50 mg at 07/01/23 3664   hydrOXYzine (ATARAX) tablet 25 mg  25 mg Oral Q6H PRN Lenox Ponds, NP       loperamide (IMODIUM) capsule 2-4 mg  2-4 mg Oral PRN Lenox Ponds, NP       methadone (DOLOPHINE) tablet 100 mg  100 mg Oral Daily Lenox Ponds,  NP       methocarbamol (ROBAXIN) tablet 500 mg  500 mg Oral Q8H PRN Lenox Ponds, NP       naproxen (NAPROSYN) tablet 500 mg  500 mg Oral BID PRN Lenox Ponds, NP   500 mg at 07/01/23 0138   OLANZapine (ZYPREXA) tablet 5 mg  5 mg Oral BID Lenox Ponds, NP   5 mg at 07/01/23 0948   ondansetron (ZOFRAN-ODT) disintegrating tablet 4 mg  4 mg Oral Q6H PRN Lenox Ponds, NP       Current Outpatient Medications  Medication Sig Dispense Refill   methadone (DOLOPHINE) 10 MG/ML solution Take 100 mg by mouth daily.     OLANZapine  zydis (ZYPREXA) 5 MG disintegrating tablet Take 5 mg by mouth at bedtime.      Lab Results:  Results for orders placed or performed during the hospital encounter of 06/29/23 (from the past 48 hour(s))  Urine rapid drug screen (hosp performed)     Status: None   Collection Time: 06/29/23  5:11 PM  Result Value Ref Range   Opiates NONE DETECTED NONE DETECTED   Cocaine NONE DETECTED NONE DETECTED   Benzodiazepines NONE DETECTED NONE DETECTED   Amphetamines NONE DETECTED NONE DETECTED   Tetrahydrocannabinol NONE DETECTED NONE DETECTED   Barbiturates NONE DETECTED NONE DETECTED    Comment: (NOTE) DRUG SCREEN FOR MEDICAL PURPOSES ONLY.  IF CONFIRMATION IS NEEDED FOR ANY PURPOSE, NOTIFY LAB WITHIN 5 DAYS.  LOWEST DETECTABLE LIMITS FOR URINE DRUG SCREEN Drug Class                     Cutoff (ng/mL) Amphetamine and metabolites    1000 Barbiturate and metabolites    200 Benzodiazepine                 200 Opiates and metabolites        300 Cocaine and metabolites        300 THC                            50 Performed at [facility], [address] [place], [place] [address]   Comprehensive metabolic panel     Status: Abnormal   Collection Time: 06/29/23  9:18 PM  Result Value Ref Range   Sodium 138 135 - 145 mmol/L   Potassium 3.9 3.5 - 5.1 mmol/L   Chloride 103 98 - 111 mmol/L   CO2 25 22 - 32 mmol/L   Glucose, Bld 106 (H) 70 - 99 mg/dL     Comment: Glucose reference range applies only to samples taken after fasting for at least 8 hours.   BUN 9 6 - 20 mg/dL   Creatinine, Ser 7.84 0.44 - 1.00 mg/dL   Calcium 9.2 8.9 - 69.6 mg/dL   Total Protein 7.6 6.5 - 8.1 g/dL   Albumin 3.7 3.5 - 5.0 g/dL   AST 55 (H) 15 - 41 U/L   ALT 58 (H) 0 - 44 U/L   Alkaline Phosphatase 87 38 - 126 U/L   Total Bilirubin <0.1 (L) 0.3 - 1.2 mg/dL   GFR, Estimated >29 >52 mL/min    Comment: (NOTE) Calculated using the CKD-EPI Creatinine Equation (2021)    Anion gap 10 5 - 15    Comment: Performed at [facility], [address], [place], [place] [address]  Ethanol     Status: None   Collection Time: 06/29/23  9:18 PM  Result Value Ref Range   Alcohol, Ethyl (B) <10 <10 mg/dL    Comment: (NOTE) Lowest detectable limit for serum alcohol is 10 mg/dL.  For medical purposes only. Performed at [facility], [address] [place], [place] [address]   CBC with Diff     Status: Abnormal   Collection Time: 06/29/23  9:18 PM  Result Value Ref Range   WBC 14.9 (H) 4.0 - 10.5 K/uL   RBC 5.53 (H) 3.87 - 5.11 MIL/uL   Hemoglobin 14.1 12.0 - 15.0 g/dL   HCT 27.2 53.6 - 64.4 %   MCV 80.5 80.0 - 100.0 fL   MCH 25.5 (L)  26.0 - 34.0 pg   MCHC 31.7 30.0 - 36.0 g/dL   RDW 54.0 98.1 - 19.1 %   Platelets 349 150 - 400 K/uL   nRBC 0.0 0.0 - 0.2 %   Neutrophils Relative % 73 %   Neutro Abs 11.1 (H) 1.7 - 7.7 K/uL   Lymphocytes Relative 17 %   Lymphs Abs 2.5 0.7 - 4.0 K/uL   Monocytes Relative 7 %   Monocytes Absolute 1.0 0.1 - 1.0 K/uL   Eosinophils Relative 1 %   Eosinophils Absolute 0.1 0.0 - 0.5 K/uL   Basophils Relative 1 %   Basophils Absolute 0.1 0.0 - 0.1 K/uL   Immature Granulocytes 1 %   Abs Immature Granulocytes 0.08 (H) 0.00 - 0.07 K/uL    Comment: Performed at [facility], [address], [place], [place] [address]  Salicylate level     Status: Abnormal   Collection Time: 06/29/23  9:18 PM  Result Value Ref Range    Salicylate Lvl <7.0 (L) 7.0 - 30.0 mg/dL    Comment: Performed at [facility], [address], [place], [place] [address]  Acetaminophen level     Status: Abnormal   Collection Time: 06/29/23  9:18 PM  Result Value Ref Range   Acetaminophen (Tylenol), Serum <10 (L) 10 - 30 ug/mL    Comment: (NOTE) Therapeutic concentrations vary significantly. A range of 10-30 ug/mL  may be an effective concentration for many patients. However, some  are best treated at concentrations outside of this range. Acetaminophen concentrations >150 ug/mL at 4 hours after ingestion  and >50 ug/mL at 12 hours after ingestion are often associated with  toxic reactions.  Performed at [facility], [address], [place], [place] [address]     Blood Alcohol level:  Lab Results  Component Value Date   [facility] <10 06/29/2023   ETH <10 09/26/2021    Physical Findings:  CIWA:    COWS:  COWS Total Score: 6  Musculoskeletal: Strength & Muscle Tone: increased Gait & Station: normal Patient leans: N/A  Psychiatric Specialty Exam:  Presentation  General Appearance:  Appropriate for Environment; Other (comment) (Less hyperactive interpersonal style, less animated)  Eye Contact: Other (comment) (Less variable, less animated)  Speech: Other (comment) (Increased amount but not pressured, easily interruptable)  Speech Volume: Normal  Handedness: Right   Mood and Affect  Mood: Depressed; Anxious ("Doing a bit better today")  Affect: Other (comment) (Less animated)   Thought Process  Thought Processes: Goal Directed; Linear; Coherent  Descriptions of Associations:Intact  Orientation:Full (Time, Place and Person)  Thought Content:Other (comment) (Less scattered, more focused)  History of Schizophrenia/Schizoaffective disorder:No  Duration of Psychotic Symptoms:Less than six months  Hallucinations:Hallucinations: None  Ideas of Reference:None  Suicidal Thoughts:Suicidal  Thoughts: No  Homicidal Thoughts:Homicidal Thoughts: No   Sensorium  Memory: Immediate Fair; Recent Fair; Remote Fair  Judgment: Intact  Insight: Present   Executive Functions  Concentration: Fair  Attention Span: Fair  Recall: [employer] of Knowledge: Fair  Language: Fair   Psychomotor Activity  Psychomotor Activity: Psychomotor Activity: Extrapyramidal Side Effects (EPS) Extrapyramidal Side Effects (EPS): Tardive Dyskinesia AIMS Completed?: Other (comment) (Not formally)   Assets  Assets: Manufacturing systems engineer; Desire for Improvement; Talents/Skills; Social Lawyer; Financial Resources/Insurance; Housing; Leisure Time; Physical Health; Resilience   Sleep  Sleep: Sleep: Poor (Improving)    Physical Exam: Physical Exam Vitals and nursing note reviewed.  Constitutional:      General:  She is not in acute distress.    Appearance: She is not ill-appearing, toxic-appearing or diaphoretic.  Pulmonary:     Effort: Pulmonary effort is normal.  Neurological:     Mental Status: She is alert and oriented to person, place, and time.  Psychiatric:        Attention and Perception: Attention and perception normal. She does not perceive auditory or visual hallucinations.        Mood and Affect: Mood is anxious and depressed.        Speech: Speech is rapid and pressured (Increased but not pressured).        Behavior: Behavior is hyperactive (Improving). Behavior is not agitated or aggressive. Behavior is cooperative.        Thought Content: Thought content is not paranoid or delusional. Thought content does not include homicidal or suicidal ideation.        Cognition and Memory: Cognition and memory normal.    Review of Systems  Neurological:  Positive for tremors (Right foot TD).  Psychiatric/Behavioral:  Positive for depression and substance abuse. Negative for hallucinations and suicidal ideas. The patient is nervous/anxious and has insomnia  (Improving).   All other systems reviewed and are negative.  Blood pressure (!) 162/107, pulse 84, temperature 98.4 F (36.9 C), temperature source Oral, resp. rate 17, height 5\' 6"  (1.676 m), SpO2 94%. Body mass index is 37.12 kg/m.   Medical Decision Making:  Patient today upon reevaluation endorses improvements in mixed bipolar symptomology.  Patient endorses she is not experiencing withdrawal symptomology from opiates.  Discussed restarting the patient's methadone, to which she was amenable, dose was able to be verified.  Patient endorses olanzapine has been tolerable, finds it helpful.  Plan remains the same, will continue to recommend inpatient, patient has been faxed out by CSW team.  Recommendations     #Bipolar I disorder, most recent episode mixed (HCC) -Improving   -Recommend continue inpatient hospitalization, voluntarily -Recommend continue olanzapine p.o. 5 mg twice daily -Recommend continue safety precautions   #Opioid use disorder- improving -Recommend methadone 100 mg p.o., dose able to be confirmed -Recommend continue COWS protocol/monitoring   #Tardive dyskinesia- chronic/stable/no change -Aims: 5 -Recommend/consider neurology for pharmacological intervention    Lenox Ponds, NP 07/01/2023, 10:04 AM

## 2023-07-01 NOTE — ED Notes (Signed)
Called safe transport to get her taken to room 309 at [facility]

## 2023-07-01 NOTE — Progress Notes (Signed)
Admission Note:  42 yr female who presents as voluntary pt in no acute distress for the treatment of detox and bipolar Depression.  Pt was calm and cooperative with admission process. Pt denies SI and contracts for safety upon admission. Pt denies AVH .  Pt has Past medical Hx of ADHD, bipolar type Depression without complication . Pt reports that she is disabled and lives with her father,helping to provide care for him. Pt stated she has no primary care physician and has trouble getting medications Pt searched and no contraband found, POC and unit policies explained and understanding verbalized. Consents obtained. Food and fluids offered, and fluids accepted. Pt had no additional questions or concerns

## 2023-07-01 NOTE — ED Notes (Signed)
Report called to [facility] nurse for room 309

## 2023-07-01 NOTE — Progress Notes (Addendum)
Pt was accepted to [facility] TODAY 07/01/2023, pending updated vital signs and voluntary consent faxed to [phone]. Bed assignment: 309  Pt meets inpatient criteria per Arsenio Loader, NP  Attending Physician will be Lewanda Rife, MD   Report can be called to: [phone]  Pt can arrive after pending items are received  Care Team Notified: [facility] Malva Limes, RN, Arsenio Loader, NP, Doyce Para, RN, and April Smith, RN  [place], [place]  07/01/2023 11:15 AM

## 2023-07-02 DIAGNOSIS — F3163 Bipolar disorder, current episode mixed, severe, without psychotic features: Secondary | ICD-10-CM

## 2023-07-02 LAB — LIPID PANEL: Triglycerides: 163 mg/dL — ABNORMAL HIGH (ref <150)

## 2023-07-02 MED ORDER — CLONIDINE HCL 0.1 MG PO TABS
0.1000 mg | ORAL_TABLET | Freq: Two times a day (BID) | ORAL | Status: DC
  Administered 2023-07-02 – 2023-07-06 (×8): 0.1 mg via ORAL
  Filled 2023-07-02 (×8): qty 1

## 2023-07-02 MED ORDER — TRAZODONE HCL 100 MG PO TABS
100.0000 mg | ORAL_TABLET | Freq: Every day | ORAL | Status: DC
  Administered 2023-07-02 – 2023-07-05 (×4): 100 mg via ORAL
  Filled 2023-07-02 (×4): qty 1

## 2023-07-02 MED ORDER — LAMOTRIGINE 25 MG PO TABS
25.0000 mg | ORAL_TABLET | Freq: Every day | ORAL | Status: DC
  Administered 2023-07-02 – 2023-07-06 (×5): 25 mg via ORAL
  Filled 2023-07-02 (×5): qty 1

## 2023-07-02 MED ORDER — OXCARBAZEPINE 300 MG PO TABS
300.0000 mg | ORAL_TABLET | Freq: Two times a day (BID) | ORAL | Status: DC
  Administered 2023-07-02 – 2023-07-06 (×8): 300 mg via ORAL
  Filled 2023-07-02 (×9): qty 1

## 2023-07-02 NOTE — H&P (Cosign Needed Addendum)
Psychiatric Admission Assessment Adult  Patient Identification: [name] MRN:  [id number] Date of Evaluation:  07/02/2023 Chief Complaint:  Bipolar 1 disorder, mixed (HCC) [F31.60] Principal Diagnosis: Bipolar affective disorder, mixed, severe (HCC) Diagnosis:  Principal Problem:   Bipolar affective disorder, mixed, severe (HCC)  History of Present Illness:  On assessment, she stated "I relapsed on fentanyl so they would admit me."  She feels she has been "stuck in bipolar depression", moderate level today.  "I'm usually pretty happy.  My mom thinks I am manic all the time but I'm just a happy person".  Her mother is in a retirement home and she lives with her father who has dementia , she is his caretaker.  Her brother is with him now.  Her sleep is fair at 6 hours per night with sleep initiation often an issue.  Appetite is "pretty good".  History of opioid use disorder, methadone in place and confirmed by her provider at [facility].  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  Dr. Marval Regal recommended Trileptal and Lamictal for compliance and stabilization, orders placed.  Hyperverbal on assessment and a low level of restlessness, no other issues noted.  Denies hallucinations and paranoia.  HPI on Admission to the ED: [name] is a 42 y.o. caucasian female with a past psychiatric history of PTSD, bipolar affective disorder with periods of mania and depression, anxiety, ADHD, polysubstance abuse (though largely opiates use), and substance induced mood disorder, with pertinent medical comorbidities that include hypertension, and additional past and pertinent psychiatric history of multiple hospitalizations for decompensation of the patient's mental health, who presents this encounter after initially arriving to the [facility] for psychiatric complaints of relapse on opiates in the form of fentanyl and mixed  bipolar symptomology, but had to be transferred to the [facility] Emergency department d/t significantly elevated and abnormal vital signs.  Patient currently voluntary and notably medically cleared by EDP team.   HPI:   Patient seen today at [facility] emergency department for face-to-face psychiatric evaluation.  Patient tells me that she recently yesterday relapsed on utilizing opiates in the form of fentanyl, states that she took it in an attempt to obtain formal inpatient hospital treatment, states that she has been having a very difficult time lately with her bipolar symptomology she experiences she states "on and off very frequently".  Patient tells me that "probably" every other week that she is going through periods of not sleeping, having racing thoughts, and feeling very hyperactive, then will "crash" and experience very difficult depression.  Patient states that she however is able to sleep a couple hours most nights when she utilizes the methadone that she is prescribed through [facility] in [place], but states that she really wants to get back on psychiatric medications to control her bipolar symptoms, states that she has no outpatient psychiatric services, states that she has not had outpatient psychiatric care since 2015.    Patient reports that for her bipolar symptoms she has done poorly on a variety of medications, specifically tells me Abilify caused tardive dyskinesia and Tegretol and Depakote caused hair loss, notably presents during our engagement with what appears to be involuntary movements of her right foot. Patient is unable to recall tardive dyskinesia medicine she has taken in the past, but wants to potentially have this addressed during inpatient hospitalization as well. Patient endorses no history of suicidal attempts and/or self  injures behavior, past or present.  Patient affirms large history of multiple hospitalizations for decompensation of her bipolar  symptoms and substance abuse.  Patient endorses no homicidal ideations.  Patient endorses no psychotic features, and objectively, does not appear to be experiencing auditory visual hallucinations and/or internal stimuli.  Patient does presents meaningfully with depressed and anxious mood, pressured and difficult to interrupt speech, atypical and animated interpersonal style, and mild hyperactivity. Patient denies paranoia.  Patient orientation is intact upon assessment.  Patient endorses no withdrawal symptomology from opiate use, however, reports her last dose of methadone was yesterday at the clinic.  Patient reports that she has secure housing and financial funds, reports that she lives with her dad and has mental health disability income.  Patient reports no current or recent use of other substances outside of recent fentanyl use, denies EtOH use currently, endorses daily tobacco use of 1 cigarette/day.  Patient reports a vague substance abuse history of "I have used just about everything in the past".  Reports has been on methadone treatment through the clinic for approximately 3 to 4 years now.   Associated Signs/Symptoms: Depression Symptoms:  depressed mood, anxiety, disturbed sleep, (Hypo) Manic Symptoms:   hyperverbal, some restlessness Anxiety Symptoms:   none Psychotic Symptoms:   none PTSD Symptoms: NA Total Time spent with patient: 1 hour  Past Psychiatric History:  bipolar d/o, opiate dependency  Is the patient at risk to self? Yes.    Has the patient been a risk to self in the past 6 months? Yes.    Has the patient been a risk to self within the distant past? Yes.    Is the patient a risk to others? No.  Has the patient been a risk to others in the past 6 months? No.  Has the patient been a risk to others within the distant past? No.   [place] Scale:  Flowsheet Row Admission (Current) from 07/01/2023 in [facility] INPATIENT BEHAVIORAL MEDICINE Most recent reading at 07/01/2023  6:44  PM ED from 06/29/2023 in [facility] Emergency Department at [facility] Most recent reading at 07/01/2023  8:00 AM ED from 06/29/2023 in [facility] Most recent reading at 06/29/2023  6:03 PM  C-SSRS RISK CATEGORY No Risk No Risk No Risk        Prior Inpatient Therapy: Yes.   If yes, describe, multiple admissions Prior Outpatient Therapy: Yes.   If yes, describe, [facility]   Alcohol Screening: Patient refused Alcohol Screening Tool: Yes 1. How often do you have a drink containing alcohol?: Never 2. How many drinks containing alcohol do you have on a typical day when you are drinking?: 1 or 2 3. How often do you have six or more drinks on one occasion?: Never AUDIT-C Score: 0 Alcohol Brief Interventions/Follow-up: Patient Refused Substance Abuse History in the last 12 months:  Yes.   Consequences of Substance Abuse: Family Consequences:  relationship issues Previous Psychotropic Medications: Yes  Psychological Evaluations: Yes  Past Medical History:  Past Medical History:  Diagnosis Date   ADHD (attention deficit hyperactivity disorder)    Alcohol abuse    Anxiety    Bipolar 1 disorder (HCC)    Borderline systolic HTN    Depression    GERD (gastroesophageal reflux disease)    Heroin abuse (HCC)    Hypertension    Kidney stone    Knee pain, bilateral    - patellofemoral syndrome, followed by Dr. [name] at [facility]   Migraines  Migraines    Polysubstance abuse (HCC)    - Hx ETOH, cocaine, THC, IV heroin, narcotics   Psychiatric care    Previous [facility] H&P notes PMHx Schizophrenia, though patient denies this.   Seizures (HCC)     Past Surgical History:  Procedure Laterality Date   KNEE SURGERY     SHOULDER SURGERY     Family History:  Family History  Problem Relation Age of Onset   Hypertension Father    Family Psychiatric  History:  Tobacco Screening:  Social History   Tobacco Use  Smoking Status Former   Current packs/day: 0.50    Types: Cigarettes  Smokeless Tobacco Never    [facility] Tobacco Counseling     Are you interested in Tobacco Cessation Medications?  No value filed. Counseled patient on smoking cessation:  No value filed. Reason Tobacco Screening Not Completed: No value filed.       Social History:  Social History   Substance and Sexual Activity  Alcohol Use No     Social History   Substance and Sexual Activity  Drug Use No   Types: IV   Comment: methadone     Additional Social History:    Allergies:   Allergies  Allergen Reactions   Shellfish Allergy Anaphylaxis and Hives   Meat Extract     Pt is vegetarian   Lab Results: No results found for this or any previous visit (from the past 48 hour(s)).  Blood Alcohol level:  Lab Results  Component Value Date   ETH <10 06/29/2023   ETH <10 09/26/2021    Metabolic Disorder Labs:  Lab Results  Component Value Date   HGBA1C 5.4 09/04/2022   MPG 108.28 09/04/2022   MPG 96.8 09/14/2018   Lab Results  Component Value Date   PROLACTIN 39.0 (H) 09/04/2022   Lab Results  Component Value Date   CHOL 145 09/04/2022   TRIG 63 09/04/2022   HDL 55 09/04/2022   CHOLHDL 2.6 09/04/2022   VLDL 13 09/04/2022   LDLCALC 77 09/04/2022   LDLCALC 86 09/14/2018    Current Medications: Current Facility-Administered Medications  Medication Dose Route Frequency Provider Last Rate Last Admin   acetaminophen (TYLENOL) tablet 650 mg  650 mg Oral Q6H PRN Lenox Ponds, NP   650 mg at 07/01/23 1753   alum & mag hydroxide-simeth (MAALOX/MYLANTA) 200-200-20 MG/5ML suspension 30 mL  30 mL Oral Q4H PRN Lenox Ponds, NP       amLODipine (NORVASC) tablet 5 mg  5 mg Oral Daily Lenox Ponds, NP   5 mg at 07/02/23 2952   cloNIDine (CATAPRES) tablet 0.1 mg  0.1 mg Oral Daily Lenox Ponds, NP   0.1 mg at 07/02/23 0831   dicyclomine (BENTYL) tablet 20 mg  20 mg Oral Q6H PRN Lenox Ponds, NP       diphenhydrAMINE (BENADRYL) capsule 50 mg  50 mg  Oral TID PRN Lenox Ponds, NP       Or   diphenhydrAMINE (BENADRYL) injection 50 mg  50 mg Intramuscular TID PRN Lenox Ponds, NP       haloperidol (HALDOL) tablet 5 mg  5 mg Oral TID PRN Lenox Ponds, NP       Or   haloperidol lactate (HALDOL) injection 5 mg  5 mg Intramuscular TID PRN Lenox Ponds, NP       hydrALAZINE (APRESOLINE) tablet 50 mg  50 mg Oral TID Lenox Ponds, NP  50 mg at 07/02/23 1324   hydrOXYzine (ATARAX) tablet 25 mg  25 mg Oral Q6H PRN Lenox Ponds, NP       loperamide (IMODIUM) capsule 2-4 mg  2-4 mg Oral PRN Lenox Ponds, NP       LORazepam (ATIVAN) tablet 2 mg  2 mg Oral TID PRN Lenox Ponds, NP       Or   LORazepam (ATIVAN) injection 2 mg  2 mg Intramuscular TID PRN Lenox Ponds, NP       magnesium hydroxide (MILK OF MAGNESIA) suspension 30 mL  30 mL Oral Daily PRN Lenox Ponds, NP       methadone (DOLOPHINE) tablet 100 mg  100 mg Oral Daily Lenox Ponds, NP   100 mg at 07/02/23 4010   methocarbamol (ROBAXIN) tablet 500 mg  500 mg Oral Q8H PRN Lenox Ponds, NP       naproxen (NAPROSYN) tablet 500 mg  500 mg Oral BID PRN Lenox Ponds, NP       nicotine (NICODERM CQ - dosed in mg/24 hours) patch 14 mg  14 mg Transdermal Q0600 Lenox Ponds, NP   14 mg at 07/02/23 0838   OLANZapine (ZYPREXA) tablet 5 mg  5 mg Oral BID Lenox Ponds, NP   5 mg at 07/02/23 2725   ondansetron (ZOFRAN-ODT) disintegrating tablet 4 mg  4 mg Oral Q6H PRN Lenox Ponds, NP       traZODone (DESYREL) tablet 50 mg  50 mg Oral QHS PRN Lenox Ponds, NP       PTA Medications: Medications Prior to Admission  Medication Sig Dispense Refill Last Dose   methadone (DOLOPHINE) 10 MG/ML solution Take 100 mg by mouth daily.      OLANZapine zydis (ZYPREXA) 5 MG disintegrating tablet Take 5 mg by mouth at bedtime.        Physical Exam: Physical Exam Vitals and nursing note reviewed.  Constitutional:      Appearance: Normal appearance.   HENT:     Head: Normocephalic.     Nose: Nose normal.  Pulmonary:     Effort: Pulmonary effort is normal.  Musculoskeletal:        General: Normal range of motion.     Cervical back: Normal range of motion.  Neurological:     General: No focal deficit present.     Mental Status: She is alert and oriented to person, place, and time.  Psychiatric:        Attention and Perception: Attention and perception normal.        Mood and Affect: Mood is anxious and depressed.        Speech: Speech normal.        Behavior: Behavior normal. Behavior is cooperative.        Thought Content: Thought content normal.        Cognition and Memory: Cognition and memory normal.        Judgment: Judgment is impulsive.    Review of Systems  Psychiatric/Behavioral:  Positive for depression. The patient is nervous/anxious.   All other systems reviewed and are negative.  Psychiatric Specialty Exam: Physical Exam Vitals and nursing note reviewed.  Constitutional:      Appearance: Normal appearance.  HENT:     Head: Normocephalic.     Nose: Nose normal.  Pulmonary:     Effort: Pulmonary effort is normal.  Musculoskeletal:        General: Normal  range of motion.     Cervical back: Normal range of motion.  Neurological:     General: No focal deficit present.     Mental Status: She is alert and oriented to person, place, and time.  Psychiatric:        Attention and Perception: Attention and perception normal.        Mood and Affect: Mood is anxious and depressed.        Speech: Speech normal.        Behavior: Behavior normal. Behavior is cooperative.        Thought Content: Thought content normal.        Cognition and Memory: Cognition and memory normal.        Judgment: Judgment is impulsive.     Review of Systems  Psychiatric/Behavioral:  Positive for depression. The patient is nervous/anxious.   All other systems reviewed and are negative.   Blood pressure (!) 158/110, pulse 98,  temperature 98 F (36.7 C), temperature source Oral, resp. rate 20, height 5' 6.5" (1.689 m), weight 116.6 kg, last menstrual period 07/01/2023, SpO2 99%.Body mass index is 40.86 kg/m.  General Appearance: Casual  Eye Contact:  Fair  Speech:  Normal Rate  Volume:  Normal  Mood:  Anxious and Depressed  Affect:  Congruent  Thought Process:  Coherent  Orientation:  Full (Time, Place, and Person)  Thought Content:  Rumination  Suicidal Thoughts:  No  Homicidal Thoughts:  No  Memory:  Immediate;   Fair Recent;   Fair Remote;   Fair  Judgement:  Fair  Insight:  Fair  Psychomotor Activity:  Restlessness  Concentration:  Concentration: Fair and Attention Span: Fair  Recall:  Fair  Fund of Knowledge:  Good  Language:  Good  Akathisia:  No  Handed:  Right  AIMS (if indicated):     Assets:  Housing Leisure Time Physical Health Resilience Social Support  ADL's:  Intact  Cognition:  WNL  Sleep:       Blood pressure (!) 160/113, pulse 99, temperature 98.7 F (37.1 C), temperature source Oral, resp. rate 17, height 5' 6.5" (1.689 m), weight 116.6 kg, last menstrual period 07/01/2023, SpO2 96%. Body mass index is 40.86 kg/m.  Treatment Plan Summary: Daily contact with patient to assess and evaluate symptoms and progress in treatment, Medication management, and Plan : Bipolar affective disorder, mixed, severe without psychosis: Trileptal 300 mg BID Lamictal 25 mg daily  Insomnia: Trazodone 100 mg daily at bedtime  Anxiety: Hydroxyzine 25 mg TID PRN  Observation Level/Precautions:  15 minute checks  Laboratory:  Completed, reviewed, stable  Psychotherapy:  Individual and group therapy  Medications:  See MAR  Consultations:  None  Discharge Concerns:  None  Estimated LOS:  3-7 days  Other:     Physician Treatment Plan for Primary Diagnosis: Bipolar affective disorder, mixed, severe (HCC) Long Term Goal(s): Improvement in symptoms so as ready for discharge  Short Term  Goals: Ability to identify changes in lifestyle to reduce recurrence of condition will improve, Ability to verbalize feelings will improve, Ability to disclose and discuss suicidal ideas, Ability to demonstrate self-control will improve, Ability to identify and develop effective coping behaviors will improve, Ability to maintain clinical measurements within normal limits will improve, Compliance with prescribed medications will improve, and Ability to identify triggers associated with substance abuse/mental health issues will improve  Physician Treatment Plan for Secondary Diagnosis: Principal Problem:   Bipolar affective disorder, mixed, severe (HCC)  Long Term Goal(s):  Improvement in symptoms so as ready for discharge  Short Term Goals: Ability to identify changes in lifestyle to reduce recurrence of condition will improve, Ability to verbalize feelings will improve, Ability to disclose and discuss suicidal ideas, Ability to demonstrate self-control will improve, Ability to identify and develop effective coping behaviors will improve, Ability to maintain clinical measurements within normal limits will improve, Compliance with prescribed medications will improve, and Ability to identify triggers associated with substance abuse/mental health issues will improve  I certify that inpatient services furnished can reasonably be expected to improve the patient's condition.    Nanine Means, NP 7/26/202410:23 AM

## 2023-07-02 NOTE — [facility] Suicide Risk Assessment (Signed)
[facility] INPATIENT:  Family/Significant Other Suicide Prevention Education  Suicide Prevention Education:  Contact Attempts: Diane Conrad/mother [phone]), has been identified by the patient as the family member/significant other with whom the patient will be residing, and identified as the person(s) who will aid the patient in the event of a mental health crisis.  With written consent from the patient, two attempts were made to provide suicide prevention education, prior to and/or following the patient's discharge.  We were unsuccessful in providing suicide prevention education.  A suicide education pamphlet was given to the patient to share with family/significant other.  Date and time of first attempt: 07/02/23 at 15:42 Date and time of second attempt: Second attempt needed.  CSW attempted contact with mother but was not able to be established. CSW did leave HIPAA compliant voicemail with contact information for follow through.   [name] 07/02/2023, 3:43 PM

## 2023-07-02 NOTE — Plan of Care (Signed)
  Problem: Education: Goal: Knowledge of disease or condition will improve Outcome: Progressing Goal: Understanding of discharge needs will improve Outcome: Progressing   

## 2023-07-02 NOTE — Progress Notes (Signed)
Nursing Shift Note:  1900-0700  Attended Evening Group: no Medication Compliant: yes  Behavior: isolative Sleep Quality: good Significant Changes: none  The patient remained in bed for the evening and into the morning.  No issues to note.

## 2023-07-02 NOTE — BH IP Treatment Plan (Signed)
Interdisciplinary Treatment and Diagnostic Plan Update  07/02/2023 Time of Session: 1:03PM KENZLEI KEMNITZ MRN: [id number]  Principal Diagnosis: Bipolar affective disorder, mixed, severe (HCC)  Secondary Diagnoses: Principal Problem:   Bipolar affective disorder, mixed, severe (HCC)   Current Medications:  Current Facility-Administered Medications  Medication Dose Route Frequency Provider Last Rate Last Admin   acetaminophen (TYLENOL) tablet 650 mg  650 mg Oral Q6H PRN Lenox Ponds, NP   650 mg at 07/01/23 1753   alum & mag hydroxide-simeth (MAALOX/MYLANTA) 200-200-20 MG/5ML suspension 30 mL  30 mL Oral Q4H PRN Lenox Ponds, NP       amLODipine (NORVASC) tablet 5 mg  5 mg Oral Daily Lenox Ponds, NP   5 mg at 07/02/23 4098   cloNIDine (CATAPRES) tablet 0.1 mg  0.1 mg Oral Daily Lenox Ponds, NP   0.1 mg at 07/02/23 0831   dicyclomine (BENTYL) tablet 20 mg  20 mg Oral Q6H PRN Lenox Ponds, NP       diphenhydrAMINE (BENADRYL) capsule 50 mg  50 mg Oral TID PRN Lenox Ponds, NP       Or   diphenhydrAMINE (BENADRYL) injection 50 mg  50 mg Intramuscular TID PRN Lenox Ponds, NP       haloperidol (HALDOL) tablet 5 mg  5 mg Oral TID PRN Lenox Ponds, NP       Or   haloperidol lactate (HALDOL) injection 5 mg  5 mg Intramuscular TID PRN Lenox Ponds, NP       hydrALAZINE (APRESOLINE) tablet 50 mg  50 mg Oral TID Lenox Ponds, NP   50 mg at 07/02/23 1236   hydrOXYzine (ATARAX) tablet 25 mg  25 mg Oral Q6H PRN Lenox Ponds, NP       loperamide (IMODIUM) capsule 2-4 mg  2-4 mg Oral PRN Lenox Ponds, NP       LORazepam (ATIVAN) tablet 2 mg  2 mg Oral TID PRN Lenox Ponds, NP       Or   LORazepam (ATIVAN) injection 2 mg  2 mg Intramuscular TID PRN Lenox Ponds, NP       magnesium hydroxide (MILK OF MAGNESIA) suspension 30 mL  30 mL Oral Daily PRN Lenox Ponds, NP       methadone (DOLOPHINE) tablet 100 mg  100 mg Oral Daily Lenox Ponds, NP   100 mg at 07/02/23 1191   methocarbamol (ROBAXIN) tablet 500 mg  500 mg Oral Q8H PRN Lenox Ponds, NP       naproxen (NAPROSYN) tablet 500 mg  500 mg Oral BID PRN Lenox Ponds, NP       nicotine (NICODERM CQ - dosed in mg/24 hours) patch 14 mg  14 mg Transdermal Q0600 Lenox Ponds, NP   14 mg at 07/02/23 0838   OLANZapine (ZYPREXA) tablet 5 mg  5 mg Oral BID Lenox Ponds, NP   5 mg at 07/02/23 4782   ondansetron (ZOFRAN-ODT) disintegrating tablet 4 mg  4 mg Oral Q6H PRN Lenox Ponds, NP       traZODone (DESYREL) tablet 50 mg  50 mg Oral QHS PRN Lenox Ponds, NP       PTA Medications: Medications Prior to Admission  Medication Sig Dispense Refill Last Dose   methadone (DOLOPHINE) 10 MG/ML solution Take 100 mg by mouth daily.      OLANZapine zydis (ZYPREXA) 5 MG disintegrating tablet Take  5 mg by mouth at bedtime.       Patient Stressors: Health problems   Substance abuse    Patient Strengths: Ability for insight  Average or above average intelligence  Capable of independent living  Supportive family/friends   Treatment Modalities: Medication Management, Group therapy, Case management,  1 to 1 session with clinician, Psychoeducation, Recreational therapy.   Physician Treatment Plan for Primary Diagnosis: Bipolar affective disorder, mixed, severe (HCC) Long Term Goal(s): Improvement in symptoms so as ready for discharge   Short Term Goals: Ability to identify changes in lifestyle to reduce recurrence of condition will improve Ability to verbalize feelings will improve Ability to disclose and discuss suicidal ideas Ability to demonstrate self-control will improve Ability to identify and develop effective coping behaviors will improve Ability to maintain clinical measurements within normal limits will improve Compliance with prescribed medications will improve Ability to identify triggers associated with substance abuse/mental health issues will  improve  Medication Management: Evaluate patient's response, side effects, and tolerance of medication regimen.  Therapeutic Interventions: 1 to 1 sessions, Unit Group sessions and Medication administration.  Evaluation of Outcomes: Not Met  Physician Treatment Plan for Secondary Diagnosis: Principal Problem:   Bipolar affective disorder, mixed, severe (HCC)  Long Term Goal(s): Improvement in symptoms so as ready for discharge   Short Term Goals: Ability to identify changes in lifestyle to reduce recurrence of condition will improve Ability to verbalize feelings will improve Ability to disclose and discuss suicidal ideas Ability to demonstrate self-control will improve Ability to identify and develop effective coping behaviors will improve Ability to maintain clinical measurements within normal limits will improve Compliance with prescribed medications will improve Ability to identify triggers associated with substance abuse/mental health issues will improve     Medication Management: Evaluate patient's response, side effects, and tolerance of medication regimen.  Therapeutic Interventions: 1 to 1 sessions, Unit Group sessions and Medication administration.  Evaluation of Outcomes: Not Met   RN Treatment Plan for Primary Diagnosis: Bipolar affective disorder, mixed, severe (HCC) Long Term Goal(s): Knowledge of disease and therapeutic regimen to maintain health will improve  Short Term Goals: Ability to verbalize frustration and anger appropriately will improve, Ability to demonstrate self-control, Ability to participate in decision making will improve, Ability to verbalize feelings will improve, Ability to disclose and discuss suicidal ideas, Ability to identify and develop effective coping behaviors will improve, and Compliance with prescribed medications will improve  Medication Management: RN will administer medications as ordered by provider, will assess and evaluate patient's  response and provide education to patient for prescribed medication. RN will report any adverse and/or side effects to prescribing provider.  Therapeutic Interventions: 1 on 1 counseling sessions, Psychoeducation, Medication administration, Evaluate responses to treatment, Monitor vital signs and CBGs as ordered, Perform/monitor CIWA, COWS, AIMS and Fall Risk screenings as ordered, Perform wound care treatments as ordered.  Evaluation of Outcomes: Not Met   LCSW Treatment Plan for Primary Diagnosis: Bipolar affective disorder, mixed, severe (HCC) Long Term Goal(s): Safe transition to appropriate next level of care at discharge, Engage patient in therapeutic group addressing interpersonal concerns.  Short Term Goals: Engage patient in aftercare planning with referrals and resources, Increase social support, Increase ability to appropriately verbalize feelings, Increase emotional regulation, Facilitate acceptance of mental health diagnosis and concerns, Facilitate patient progression through stages of change regarding substance use diagnoses and concerns, Identify triggers associated with mental health/substance abuse issues, and Increase skills for wellness and recovery  Therapeutic Interventions: Assess for all  discharge needs, 1 to 1 time with Child psychotherapist, Explore available resources and support systems, Assess for adequacy in community support network, Educate family and significant other(s) on suicide prevention, Complete Psychosocial Assessment, Interpersonal group therapy.  Evaluation of Outcomes: Not Met   Progress in Treatment: Attending groups: No. Participating in groups: No. Taking medication as prescribed: Yes. Toleration medication: Yes. Family/Significant other contact made: No, will contact:  once permission is given. Patient understands diagnosis: Yes. Discussing patient identified problems/goals with staff: Yes. Medical problems stabilized or resolved: Yes. Denies  suicidal/homicidal ideation: Yes. Issues/concerns per patient self-inventory: No. Other: none  New problem(s) identified: No, Describe:  none  New Short Term/Long Term Goal(s): detox, elimination of symptoms of psychosis, medication management for mood stabilization; elimination of SI thoughts; development of comprehensive mental wellness/sobriety plan.   Patient Goals:  "I relapsed.  I want to restart my psych medications"  Discharge Plan or Barriers: CSW to assist in the development of appropriate discharge plans.   Reason for Continuation of Hospitalization: Anxiety Depression Medication stabilization Suicidal ideation  Estimated Length of Stay:  1-7 days  Last 3 [place] Suicide Severity Risk Score: Flowsheet Row Admission (Current) from 07/01/2023 in [facility] INPATIENT BEH[facility]AL MEDICINE Most recent reading at 07/01/2023  6:44 PM ED from 06/29/2023 in [facility] Emergency Department at [facility] Most recent reading at 07/01/2023  8:00 AM ED from 06/29/2023 in [facility] Most recent reading at 06/29/2023  6:03 PM  C-SSRS RISK CATEGORY No Risk No Risk No Risk       Last PHQ 2/9 Scores:    09/26/2021    6:21 AM 01/13/2019    1:34 PM  Depression screen PHQ 2/9  Decreased Interest 0   Down, Depressed, Hopeless 0   PHQ - 2 Score 0   Altered sleeping 0   Tired, decreased energy 0   Change in appetite 0   Feeling bad or failure about yourself  0   Trouble concentrating 0   Moving slowly or fidgety/restless 0   Suicidal thoughts 0   PHQ-9 Score 0   Difficult doing work/chores Not difficult at all      Information is confidential and restricted. Go to Review Flowsheets to unlock data.    Scribe for Treatment Team: Gaylan Gerold 07/02/2023 2:36 PM

## 2023-07-02 NOTE — [facility] Suicide Risk Assessment (Signed)
[facility] INPATIENT:  Family/Significant Other Suicide Prevention Education  Suicide Prevention Education:  Education Completed; [name]/mother [phone]), has been identified by the patient as the family member/significant other with whom the patient will be residing, and identified as the person(s) who will aid the patient in the event of a mental health crisis (suicidal ideations/suicide attempt).  With written consent from the patient, the family member/significant other has been provided the following suicide prevention education, prior to the and/or following the discharge of the patient.  The suicide prevention education provided includes the following: Suicide risk factors Suicide prevention and interventions National Suicide Hotline telephone number [facility] assessment telephone number [facility] Emergency Assistance 911 [facility] and/or Residential Mobile Crisis Unit telephone number  Request made of family/significant other to: Remove weapons (e.g., guns, rifles, knives), all items previously/currently identified as safety concern.   Remove drugs/medications (over-the-counter, prescriptions, illicit drugs), all items previously/currently identified as a safety concern.  The family member/significant other verbalizes understanding of the suicide prevention education information provided.  The family member/significant other agrees to remove the items of safety concern listed above.  Mother shared that her and the pt have been talking for several weeks to months about her returning to treatment. She reported that pt was showing symptoms of main for three to four days prior to admission. [name] stressed that pt has both medical and mental health concerns that need to be addressed, one of them being her blood pressure. Mother stated that she believes pt is a danger to herself at times as she engages in dangerous activities. However, she denied pt having access to  any weapons in the home. Mother also shared that pt will have potential housing issues in the future if something were to happen to her father. CSW informed [name] that housing options were limited but that she would be given the resources available. She also inquired regarding pt medication and asked if pt would be able to get prescriptions upon discharge. CSW explained that he would let the provider know but also offered that pt would be scheduled with outpatient psychiatry to continue this medications upon discharge. She shared that she was happy to hear that because it's difficult to get pt to engage/do the things she needs to contact with a new provider on the outside (getting up, calling to schedule appointment, etc.). CSW also offered an explanation of the typical length of treatment at this facility as she inquired regarding pt's discharge date. Mother stated that she would like to be involved in pt's treatment. No other concerns expressed. Contact ended without incident.   [name] 07/02/2023, 4:06 PM

## 2023-07-02 NOTE — Group Note (Signed)
Recreation Therapy Group Note   Group Topic:Leisure Education  Group Date: 07/02/2023 Start Time: 1000 End Time: 1100 Facilitators: Rosina Lowenstein, LRT, CTRS Location:  Craft  Room  Group Description: Leisure. Patients were given the option to choose from coloring mandalas, singing karaoke, making origami, painting or listening to music. LRT and pts discussed the meaning of leisure, the importance of participating in leisure during their free time/when they're outside of the hospital, as well as how our leisure interests can also serve as coping skills. Pt identified two leisure interests and shared with the group.   Goal Area(s) Addressed:  Patient will identify a current leisure interest.  Patient will learn the definition of "leisure". Patient will practice making a positive decision. Patient will have the opportunity to try a new leisure activity. Patient will communicate with peers and LRT.     Affect/Mood: Appropriate   Participation Level: Active and Engaged   Participation Quality: Independent   Behavior: Appropriate, Calm, and Cooperative   Speech/Thought Process: Coherent   Insight: Good   Judgement: Good   Modes of Intervention: Activity   Patient Response to Interventions:  Attentive, Engaged, Interested , and Receptive   Education Outcome:  Acknowledges education   Clinical Observations/Individualized Feedback: Shenequia was active in their participation of session activities and group discussion. Pt identified "listen to music and workout" as things she does in her free time. Pt chose to color mandalas and sing karaoke while in group. Pt interacted well with LRT and peers duration of session.   Plan: Continue to engage patient in RT group sessions 2-3x/week.   Rosina Lowenstein, LRT, CTRS 07/02/2023 11:54 AM

## 2023-07-02 NOTE — Group Note (Signed)
Date:  07/02/2023 Time:  9:00 AM  Group Topic/Focus:  Goals Group:   The focus of this group is to help patients establish daily goals to achieve during treatment and discuss how the patient can incorporate goal setting into their daily lives to aide in recovery.  Community Group   Participation Level:  Did Not Attend  [name] A Larwence Tu 07/02/2023, 10:38 AM

## 2023-07-02 NOTE — Progress Notes (Addendum)
No changes in condition from baseline, slight improvement in blood pressure, no signs of opiates withdrawal. She is being monitored as  ordered  I assumed care for [name] at about 08:00.  She was resting in her bed, alert, appears to be anxious at baseline, has remained self isolative to her room most of the shift, has been med compliant, vital signs significant for elevated blood pressure,nurse practitioner aware, denied any avh/hi/si at the time of my assessment. She is being monitored as ordered.Marland Kitchen
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
## 2023-07-02 NOTE — Group Note (Signed)
Date:  07/02/2023 Time:  9:09 PM  Group Topic/Focus:  Wrap-Up Group:   The focus of this group is to help patients review their daily goal of treatment and discuss progress on daily workbooks.    Participation Level:  Active  Participation Quality:  Appropriate, Attentive, Sharing, and Supportive  Affect:  Appropriate  Cognitive:  Appropriate  Insight: Appropriate and Good  Engagement in Group:  Supportive  Modes of Intervention:  Discussion and Support  Additional Comments:     Belva Crome 07/02/2023, 9:09 PM

## 2023-07-02 NOTE — [facility] Suicide Risk Assessment (Cosign Needed Addendum)
[facility] Admission Suicide Risk Assessment   Nursing information obtained from:    Demographic factors:  Caucasian Current Mental Status:  NA Loss Factors:  NA Historical Factors:  NA Risk Reduction Factors:  NA  Total Time spent with patient: 1 hour Principal Problem: Bipolar affective disorder, mixed, severe (HCC) Diagnosis:  Principal Problem:   Bipolar affective disorder, mixed, severe (HCC)  Subjective Data:  On assessment, she stated "I relapsed on fentanyl so they would admit me."  She feels she has been "stuck in bipolar depression", moderate level today.  "I'm usually pretty happy.  My mom thinks I am manic all the time but I'm just a happy person".  Her mother is in a retirement home and she lives with her father who has dementia , she is his caretaker.  Her brother is with him now.  Her sleep is fair at 6 hours per night with sleep initiation often an issue.  Appetite is "pretty good".  History of opioid use disorder, methadone in place and confirmed by her provider at [facility].  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  Dr. Marval Regal recommended Trileptal and Lamictal for compliance and stabilization, orders placed.  Hyperverbal on assessment and a low level of restlessness, no other issues noted.  Denies hallucinations and paranoia.    Continued Clinical Symptoms:    The "Alcohol Use Disorders Identification Test", Guidelines for Use in Primary Care, Second Edition.  World Science writer [facility]). Score between 0-7:  no or low risk or alcohol related problems. Score between 8-15:  moderate risk of alcohol related problems. Score between 16-19:  high risk of alcohol related problems. Score 20 or above:  warrants further diagnostic evaluation for alcohol dependence and treatment.   CLINICAL FACTORS:   Bipolar Disorder:   Mixed State   Psychiatric Specialty Exam: Physical Exam Vitals and  nursing note reviewed.  Constitutional:      Appearance: Normal appearance.  HENT:     Head: Normocephalic.     Nose: Nose normal.  Pulmonary:     Effort: Pulmonary effort is normal.  Musculoskeletal:        General: Normal range of motion.     Cervical back: Normal range of motion.  Neurological:     General: No focal deficit present.     Mental Status: She is alert and oriented to person, place, and time.  Psychiatric:        Attention and Perception: Attention and perception normal.        Mood and Affect: Mood is anxious and depressed.        Speech: Speech normal.        Behavior: Behavior normal. Behavior is cooperative.        Thought Content: Thought content normal.        Cognition and Memory: Cognition and memory normal.        Judgment: Judgment is impulsive.     Review of Systems  Psychiatric/Behavioral:  Positive for depression. The patient is nervous/anxious.   All other systems reviewed and are negative.   Blood pressure (!) 158/110, pulse 98, temperature 98 F (36.7 C), temperature source Oral, resp. rate 20, height 5' 6.5" (1.689 m), weight 116.6 kg, last menstrual period 07/01/2023, SpO2 99%.Body mass index is 40.86 kg/m.  General Appearance: Casual  Eye Contact:  Fair  Speech:  Normal Rate  Volume:  Normal  Mood:  Anxious and Depressed  Affect:  Congruent  Thought Process:  Coherent  Orientation:  Full (Time, Place, and Person)  Thought Content:  Rumination  Suicidal Thoughts:  No  Homicidal Thoughts:  No  Memory:  Immediate;   Fair Recent;   Fair Remote;   Fair  Judgement:  Fair  Insight:  Fair  Psychomotor Activity:  Restlessness  Concentration:  Concentration: Fair and Attention Span: Fair  Recall:  Fair  Fund of Knowledge:  Good  Language:  Good  Akathisia:  No  Handed:  Right  AIMS (if indicated):     Assets:  Housing Leisure Time Physical Health Resilience Social Support  ADL's:  Intact  Cognition:  WNL  Sleep:          Physical Exam: Physical Exam Vitals and nursing note reviewed.  Constitutional:      Appearance: Normal appearance.  HENT:     Head: Normocephalic.     Nose: Nose normal.  Pulmonary:     Effort: Pulmonary effort is normal.  Musculoskeletal:        General: Normal range of motion.     Cervical back: Normal range of motion.  Neurological:     General: No focal deficit present.     Mental Status: She is alert and oriented to person, place, and time.  Psychiatric:        Attention and Perception: Attention and perception normal.        Mood and Affect: Mood is anxious and depressed.        Speech: Speech normal.        Behavior: Behavior normal. Behavior is cooperative.        Thought Content: Thought content normal.        Cognition and Memory: Cognition and memory normal.        Judgment: Judgment is impulsive.    Review of Systems  Psychiatric/Behavioral:  Positive for depression. The patient is nervous/anxious.   All other systems reviewed and are negative.  Blood pressure (!) 160/113, pulse 99, temperature 98.7 F (37.1 C), temperature source Oral, resp. rate 17, height 5' 6.5" (1.689 m), weight 116.6 kg, last menstrual period 07/01/2023, SpO2 96%. Body mass index is 40.86 kg/m.   COGNITIVE FEATURES THAT CONTRIBUTE TO RISK:  None    SUICIDE RISK:   Mild:  Suicidal ideation of limited frequency, intensity, duration, and specificity.  There are no identifiable plans, no associated intent, mild dysphoria and related symptoms, good self-control (both objective and subjective assessment), few other risk factors, and identifiable protective factors, including available and accessible social support.  PLAN OF CARE:  Daily contact with patient to assess and evaluate symptoms and progress in treatment, Medication management, and Plan : Bipolar affective disorder, mixed, severe without psychosis: Trileptal 300 mg BID Lamictal 25 mg daily  Insomnia: Trazodone 100 mg daily  at bedtime  Anxiety: Hydroxyzine 25 mg TID PRN   I certify that inpatient services furnished can reasonably be expected to improve the patient's condition.   Nanine Means, NP 07/02/2023, 10:21 AM

## 2023-07-03 MED ORDER — GABAPENTIN 300 MG PO CAPS
300.0000 mg | ORAL_CAPSULE | Freq: Three times a day (TID) | ORAL | Status: DC
  Administered 2023-07-03 – 2023-07-06 (×9): 300 mg via ORAL
  Filled 2023-07-03 (×9): qty 1

## 2023-07-03 NOTE — Plan of Care (Signed)
D- Patient alert and oriented. Patient presented in a pleasant mood on assessment reporting that she slept "fair" last night and had complaints of knee and shoulder pain. Patient rated her pain a "3/10", in which she did request PRN pain medication to help with relief. Patient endorsed slight depression and anxiety, rating them a "1/10" and "3/10". Although patient did not elaborate on why she's a little depressed, she did say that she deals with anxiety on a daily basis. Patient denied SI, HI, AVH at this time. Patient's goal for today, per her self-inventory, is to "stay clean, take my meds", in which she will "talk to the doctor", in order to achieve her goal.  A- Scheduled medications administered to patient, per MD orders. Support and encouragement provided.  Routine safety checks conducted every 15 minutes.  Patient informed to notify staff with problems or concerns.  R- No adverse drug reactions noted. Patient contracts for safety at this time. Patient compliant with medications and treatment plan. Patient receptive, calm, and cooperative. Patient interacts well with others on the unit.  Patient remains safe at this time.  Problem: Education: Goal: Knowledge of disease or condition will improve Outcome: Progressing Goal: Understanding of discharge needs will improve Outcome: Progressing   Problem: Health Behavior/Discharge Planning: Goal: Ability to identify changes in lifestyle to reduce recurrence of condition will improve Outcome: Progressing Goal: Identification of resources available to assist in meeting health care needs will improve Outcome: Progressing   Problem: Physical Regulation: Goal: Complications related to the disease process, condition or treatment will be avoided or minimized Outcome: Progressing   Problem: Safety: Goal: Ability to remain free from injury will improve Outcome: Progressing

## 2023-07-03 NOTE — Plan of Care (Signed)

## 2023-07-03 NOTE — Progress Notes (Signed)
Upon re-check, patient's vitals were as follows:   07/03/23 1859  Vital Signs  Pulse Rate 96  BP 138/87  BP Location Right Arm  BP Method Automatic  Patient Position (if appropriate) Sitting

## 2023-07-03 NOTE — Progress Notes (Signed)
[facility] MD Progress Note  07/03/2023 11:04 AM [name]  MRN:  [id number]  Subjective: "I feel good" Patient is assessed face-to-face by this provider after chart review. On evaluation, patient is seated in the dayroom in no acute distress. Patient is alert, oriented x 4, and cooperative. Speech is clear, but rapid and pressured. Pt appears well groomed. Eye contact is good. She denies anxiety or depression, affect is congruent with mood. Thought process is coherent and thought content is WDL. Pt  denies suicidal or homicidal ideation. She denies auditory and visual hallucinations. Patient sleep and appetite are "good". She denies experiencing side effects from her medications.  Patient is looking forward to going home and taking care of her father who she is the sole caretaker. Patient offered support and encouragement. She denies any additional needs at this time.  Principal Problem: Bipolar affective disorder, mixed, severe (HCC) Diagnosis: Principal Problem:   Bipolar affective disorder, mixed, severe (HCC)  Total Time spent with patient: 30 minutes  Past Psychiatric History: bipolar disorder, opiate use d/o  Past Medical History:  Past Medical History:  Diagnosis Date   ADHD (attention deficit hyperactivity disorder)    Alcohol abuse    Anxiety    Bipolar 1 disorder (HCC)    Borderline systolic HTN    Depression    GERD (gastroesophageal reflux disease)    Heroin abuse (HCC)    Hypertension    Kidney stone    Knee pain, bilateral    - patellofemoral syndrome, 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   Seizures (HCC)     Past Surgical History:  Procedure Laterality Date   KNEE SURGERY     SHOULDER SURGERY     Family History:  Family History  Problem Relation Age of Onset    Hypertension Father    Family Psychiatric  History: none Social History:  Social History   Substance and Sexual Activity  Alcohol Use No     Social History   Substance and Sexual Activity  Drug Use No   Types: IV   Comment: methadone     Social History   Socioeconomic History   Marital status: Single    Spouse name: Not on file   Number of children: Not on file   Years of education: Not on file   Highest education level: Not on file  Occupational History   Not on file  Tobacco Use   Smoking status: Former    Current packs/day: 0.50    Types: Cigarettes   Smokeless tobacco: Never  Vaping Use   Vaping status: Never Used  Substance and Sexual Activity   Alcohol use: No   Drug use: No    Types: IV    Comment: methadone    Sexual activity: Not on file  Other Topics Concern   Not on file  Social History Narrative   Not on file   Social Determinants of Health   Financial Resource Strain: Medium Risk (09/08/2022)   Received from [facility], [facility]   Overall Financial Resource Strain (CARDIA)    Difficulty of Paying Living Expenses: Somewhat hard  Food Insecurity: No Food Insecurity (07/01/2023)   Hunger Vital Sign    Worried About Running Out of Food in the Last  Year: Never true    Ran Out of Food in the Last Year: Never true  Transportation Needs: No Transportation Needs (07/01/2023)   PRAPARE - Administrator, Civil Service (Medical): No    Lack of Transportation (Non-Medical): No  Physical Activity: Not on file  Stress: No Stress Concern Present (09/08/2022)   Received from [facility], [facility] of Occupational Health - Occupational Stress Questionnaire    Feeling of Stress : Only a little  Social Connections: Unknown (09/07/2022)   Received from [facility], [facility]   Social Network    Social Network: Not on file   Additional Social History: lives with her father who has dementia that she cares for     Sleep: Good  Appetite:  Good  Current Medications: Current Facility-Administered Medications  Medication Dose Route Frequency Provider Last Rate Last Admin   acetaminophen (TYLENOL) tablet 650 mg  650 mg Oral Q6H PRN Lenox Ponds, NP   650 mg at 07/03/23 0938   alum & mag hydroxide-simeth (MAALOX/MYLANTA) 200-200-20 MG/5ML suspension 30 mL  30 mL Oral Q4H PRN Lenox Ponds, NP       amLODipine (NORVASC) tablet 5 mg  5 mg Oral Daily Lenox Ponds, NP   5 mg at 07/03/23 0636   cloNIDine (CATAPRES) tablet 0.1 mg  0.1 mg Oral BID Charm Rings, NP   0.1 mg at 07/03/23 0636   dicyclomine (BENTYL) tablet 20 mg  20 mg Oral Q6H PRN Lenox Ponds, NP       diphenhydrAMINE (BENADRYL) capsule 50 mg  50 mg Oral TID PRN Lenox Ponds, NP       Or   diphenhydrAMINE (BENADRYL) injection 50 mg  50 mg Intramuscular TID PRN Lenox Ponds, NP       haloperidol (HALDOL) tablet 5 mg  5 mg Oral TID PRN Lenox Ponds, NP       Or   haloperidol lactate (HALDOL) injection 5 mg  5 mg Intramuscular TID PRN Lenox Ponds, NP       hydrALAZINE (APRESOLINE) tablet 50 mg  50 mg Oral TID Lenox Ponds, NP   50 mg at 07/03/23 7829   hydrOXYzine (ATARAX) tablet 25 mg  25 mg Oral Q6H PRN Lenox Ponds, NP       lamoTRIgine (LAMICTAL) tablet 25 mg  25 mg Oral Daily Charm Rings, NP   25 mg at 07/03/23 5621   loperamide (IMODIUM) capsule 2-4 mg  2-4 mg Oral PRN Lenox Ponds, NP       magnesium hydroxide (MILK OF MAGNESIA) suspension 30 mL  30 mL Oral Daily PRN Lenox Ponds, NP       methadone (DOLOPHINE) tablet 100 mg  100 mg Oral Daily Lenox Ponds, NP   100 mg at 07/03/23 3086   methocarbamol (ROBAXIN) tablet 500 mg  500 mg Oral Q8H PRN Lenox Ponds, NP       naproxen (NAPROSYN) tablet 500 mg  500 mg Oral BID PRN Lenox Ponds, NP       nicotine (NICODERM CQ - dosed in mg/24 hours) patch 14 mg  14 mg Transdermal Q0600 Lenox Ponds, NP   14 mg at 07/03/23 0638    ondansetron (ZOFRAN-ODT) disintegrating tablet 4 mg  4 mg Oral Q6H PRN Lenox Ponds, NP       Oxcarbazepine (TRILEPTAL) tablet 300 mg  300 mg Oral  BID Charm Rings, NP   300 mg at 07/03/23 0981   traZODone (DESYREL) tablet 100 mg  100 mg Oral QHS Charm Rings, NP   100 mg at 07/02/23 2104    Lab Results:  Results for orders placed or performed during the hospital encounter of 07/01/23 (from the past 48 hour(s))  Hemoglobin A1c     Status: Abnormal   Collection Time: 07/02/23  1:14 PM  Result Value Ref Range   Hgb A1c MFr Bld 7.5 (H) 4.8 - 5.6 %    Comment: (NOTE)         Prediabetes: 5.7 - 6.4         Diabetes: >6.4         Glycemic control for adults with diabetes: <7.0    Mean Plasma Glucose 169 mg/dL    Comment: (NOTE) Performed At: [facility] [address] [place], [place] [id number] Jolene Schimke MD AO:[id number]   Lipid panel     Status: Abnormal   Collection Time: 07/02/23  1:14 PM  Result Value Ref Range   Cholesterol 145 0 - 200 mg/dL   Triglycerides 962 (H) <150 mg/dL   HDL 35 (L) >95 mg/dL   Total CHOL/HDL Ratio 4.1 RATIO   VLDL 33 0 - 40 mg/dL   LDL Cholesterol 77 0 - 99 mg/dL    Comment:        Total Cholesterol/HDL:CHD Risk Coronary Heart Disease Risk Table                     Men   Women  1/2 Average Risk   3.4   3.3  Average Risk       5.0   4.4  2 X Average Risk   9.6   7.1  3 X Average Risk  23.4   11.0        Use the calculated Patient Ratio above and the CHD Risk Table to determine the patient's CHD Risk.        ATP III CLASSIFICATION (LDL):  <100     mg/dL   Optimal  284-132  mg/dL   Near or Above                    Optimal  130-159  mg/dL   Borderline  440-102  mg/dL   High  >725     mg/dL   Very High Performed at [facility], [address] [place], [place] [address]     Blood Alcohol level:  Lab Results  Component Value Date   [facility] <10 06/29/2023   ETH <10 09/26/2021    Metabolic Disorder Labs: Lab  Results  Component Value Date   HGBA1C 7.5 (H) 07/02/2023   MPG 169 07/02/2023   MPG 108.28 09/04/2022   Lab Results  Component Value Date   PROLACTIN 39.0 (H) 09/04/2022   Lab Results  Component Value Date   CHOL 145 07/02/2023   TRIG 163 (H) 07/02/2023   HDL 35 (L) 07/02/2023   CHOLHDL 4.1 07/02/2023   VLDL 33 07/02/2023   LDLCALC 77 07/02/2023   LDLCALC 77 09/04/2022     Musculoskeletal: Strength & Muscle Tone: within normal limits Gait & Station: normal Patient leans: N/A  Psychiatric Specialty Exam: Physical Exam Vitals and nursing note reviewed.  Constitutional:      Appearance: Normal appearance.  HENT:     Head: Normocephalic.     Nose: Nose normal.  Pulmonary:  Effort: Pulmonary effort is normal.  Musculoskeletal:        General: Normal range of motion.     Cervical back: Normal range of motion.  Neurological:     General: No focal deficit present.     Mental Status: She is alert and oriented to person, place, and time.  Psychiatric:        Attention and Perception: Attention and perception normal.        Mood and Affect: Mood is anxious and depressed.        Speech: Speech normal.        Behavior: Behavior normal. Behavior is cooperative.        Thought Content: Thought content normal.        Cognition and Memory: Cognition and memory normal.        Judgment: Judgment normal.     Review of Systems  Psychiatric/Behavioral:  Positive for depression. The patient is nervous/anxious.   All other systems reviewed and are negative.   Blood pressure 130/87, pulse (!) 118, temperature 98.7 F (37.1 C), temperature source Oral, resp. rate 18, height 5' 6.5" (1.689 m), weight 116.6 kg, last menstrual period 07/01/2023, SpO2 95%.Body mass index is 40.86 kg/m.  General Appearance: Casual  Eye Contact:  Good  Speech:  Pressured  Volume:  Normal  Mood:  Euthymic  Affect:  Appropriate and Congruent  Thought Process:  Goal Directed  Orientation:  Full  (Time, Place, and Person)  Thought Content:  WDL and Logical  Suicidal Thoughts:  No  Homicidal Thoughts:  No  Memory:  Immediate;   Good  Judgement:  Fair  Insight:  Fair  Psychomotor Activity:  Normal  Concentration:  Concentration: Good and Attention Span: Good  Recall:  Good  Fund of Knowledge:  Good  Language:  Good  Akathisia:  NA  Handed:  Right  AIMS (if indicated):     Assets:  Communication Skills Desire for Improvement Social Support  ADL's:  Intact  Cognition:  WNL  Sleep:   "good"      Physical Exam: Physical Exam Vitals and nursing note reviewed.  Constitutional:      Appearance: Normal appearance.  HENT:     Head: Normocephalic.     Nose: Nose normal.  Pulmonary:     Effort: Pulmonary effort is normal.  Musculoskeletal:        General: Normal range of motion.     Cervical back: Normal range of motion.  Neurological:     General: No focal deficit present.     Mental Status: She is alert and oriented to person, place, and time.  Psychiatric:        Attention and Perception: Attention and perception normal.        Mood and Affect: Mood is anxious and depressed.        Speech: Speech normal.        Behavior: Behavior normal. Behavior is cooperative.        Thought Content: Thought content normal.        Cognition and Memory: Cognition and memory normal.        Judgment: Judgment normal.    Review of Systems  Psychiatric/Behavioral:  Positive for depression. The patient is nervous/anxious.   All other systems reviewed and are negative.  Blood pressure 130/87, pulse (!) 118, temperature 98.7 F (37.1 C), temperature source Oral, resp. rate 18, height 5' 6.5" (1.689 m), weight 116.6 kg, last menstrual period 07/01/2023, SpO2 95%. Body mass  index is 40.86 kg/m.   Treatment Plan Summary: Daily contact with patient to assess and evaluate symptoms and progress in treatment, Medication management, and Plan : Bipolar affective disorder, mixed, severe  without psychosis: Trileptal 300 mg BID Lamictal 25 mg daily   Insomnia: Trazodone 100 mg daily at bedtime   Anxiety: Hydroxyzine 25 mg TID PRN  Nanine Means, NP 07/03/2023, 11:04 AM

## 2023-07-03 NOTE — Group Note (Signed)
Date:  07/03/2023 Time:  6:28 PM  Group Topic/Focus:  Healthy Communication:   The focus of this group is to discuss communication, barriers to communication, as well as healthy ways to communicate with others.    Participation Level:  Active  Participation Quality:  Appropriate  Affect:  Appropriate  Cognitive:  Appropriate  Insight: Appropriate  Engagement in Group:  Engaged  Modes of Intervention:  Activity  Additional Comments:    Wilford Corner 07/03/2023, 6:28 PM

## 2023-07-03 NOTE — Progress Notes (Signed)
Patient's vitals were taken after dinner and she was given scheduled Hydralazine and Clonidine. Patient's BP will be re-checked.   07/03/23 1735  Vital Signs  Temp 98.8 F (37.1 C)  Temp Source Oral  Pulse Rate (!) 105  Pulse Rate Source Monitor  BP (!) 153/85  BP Location Left Arm  BP Method Automatic  Patient Position (if appropriate) Sitting  Oxygen Therapy  SpO2 96 %  O2 Device Room Air

## 2023-07-03 NOTE — Group Note (Signed)
LCSW Group Therapy Note  Group Date: 07/03/2023 Start Time: 1345 End Time: 1420   Type of Therapy and Topic:  Group Therapy - Healthy vs Unhealthy Coping Skills  Participation Level:  Active   Description of Group The focus of this group was to determine what unhealthy coping techniques typically are used by group members and what healthy coping techniques would be helpful in coping with various problems. Patients were guided in becoming aware of the differences between healthy and unhealthy coping techniques. Patients were asked to identify 2-3 healthy coping skills they would like to learn to use more effectively.  Therapeutic Goals Patients learned that coping is what human beings do all day long to deal with various situations in their lives Patients defined and discussed healthy vs unhealthy coping techniques Patients identified their preferred coping techniques and identified whether these were healthy or unhealthy Patients determined 2-3 healthy coping skills they would like to become more familiar with and use more often. Patients provided support and ideas to each other   Summary of Patient Progress:  The patient attended group. Patient proved open to input from peers and feedback from CSW. Patient demonstrated  insight into the subject matter, was respectful of peers, and participated throughout the entire session. The patient stated that she usually turns to drugs and alcohol when she is upset, anxious, or stressed. The patient stated that the healthy coping skills she uses is workout, walk her dog, and go to church. The patient stated that she has her mom to support her and see a Veterinary surgeon.    Therapeutic Modalities Cognitive Behavioral Therapy Motivational Interviewing  Tama Headings 07/03/2023  2:31 PM

## 2023-07-03 NOTE — Group Note (Signed)
Date:  07/03/2023 Time:  11:38 AM  Group Topic/Focus:  OUTDOOR RECREATION AND SOCIALIZATION.    Participation Level:  Active  Participation Quality:  Appropriate  Affect:  Appropriate  Cognitive:  Alert and Appropriate  Insight: Appropriate  Engagement in Group:  Engaged and Supportive  Modes of Intervention:  Activity, Rapport Building, and Socialization  Additional Comments:    Rosaura Carpenter 07/03/2023, 11:38 AM

## 2023-07-03 NOTE — Progress Notes (Signed)
Patient stated that she was anxious, so this Clinical research associate gave her PRN Vistaril to help with relief. Patient tolerated medication administration well, without any issues. Patient remains safe on the unit.

## 2023-07-03 NOTE — Group Note (Signed)
Date:  07/03/2023 Time:  8:26 PM  Group Topic/Focus:  Building Self Esteem:   The Focus of this group is helping patients become aware of the effects of self-esteem on their lives, the things they and others do that enhance or undermine their self-esteem, seeing the relationship between their level of self-esteem and the choices they make and learning ways to enhance self-esteem.    Participation Level:  Active  Participation Quality:  Appropriate  Affect:  Appropriate  Cognitive:  Appropriate  Insight: Appropriate  Engagement in Group:  Engaged  Modes of Intervention:  Discussion  Additional Comments:    Lenore Cordia 07/03/2023, 8:26 PM

## 2023-07-04 NOTE — Progress Notes (Signed)
Nursing Shift Note:  1900-0700  Attended Evening Group: n/a Medication Compliant: yes Behavior: anxious and preoccupied with physical concerns Sleep Quality:  Significant Changes: none  The patient endorsed heightened levels of anxiety and pain.  Pheonix remained visible in the milieu and was pleasant.  Interactions with others were appropriate.  PRN medications provided with good effect.  Blood pressures elevated.    High Fall Risk  Date/Time BP  07/04/23 1946 174/96 Important   07/04/23 1723 156/100 Important   07/04/23 1248 154/91 Important   07/04/23 0625 148/89 Important   07/03/23 1859 138/87  07/03/23 1735 153/85 Important   07/03/23 0937 130/87  07/03/23 0712 157/96 Important   07/03/23 0710 167/89 Important   07/03/23 0707 169/104 Important   07/03/23 0630 172/113 Important   07/03/23 0623 188/147 Important   07/02/23 1642 152/113 Important   07/02/23 1500 164/106 Important   07/02/23 1100 158/110 Important   07/02/23 0630 160/113 Important   07/01/23 1330 144/104 Important   07/01/23 1211 132/78  07/01/23 1108 134/90 Important   07/01/23 0700 162/107 Important   07/01/23 0013 163/100 Important   06/30/23 2112 153/88 Important   06/30/23 1424 190/110 Important   06/30/23 1035 162/102 Important   06/30/23 0926 176/106 Important    06/30/23 0902 172/103 Important   06/29/23 2338 172/112 Important   06/29/23 2335 172/112 Important   06/29/23 2229 195/119 Important   06/29/23 1855 205/120 Important   06/29/23 1758 202/108 Important   06/29/23 1738 202/108 Important   06/29/23 1543 204/98 Important   06/29/23 1521 196/116 Important

## 2023-07-04 NOTE — Plan of Care (Signed)
[name] SI / HI / AVH. No signs of distress or injury. Pt is pleasant and cooperative with assessment. Adherent with scheduled medications.    Problem: Education: Goal: Knowledge of disease or condition will improve Outcome: Progressing Goal: Understanding of discharge needs will improve Outcome: Progressing

## 2023-07-04 NOTE — Progress Notes (Signed)
Nursing Shift Note:  1900-0700  Attended Evening Group: yes Medication Compliant: yes  Behavior: cooperative and pleasant Sleep Quality: good Significant Changes: none  Patient requested and administered PRN medication for pain.  High Fall Risk

## 2023-07-04 NOTE — Progress Notes (Signed)
[facility] MD Progress Note  07/04/2023 3:08 PM [name]  MRN:  [id number]  Subjective:  Patient is assessed face-to-face by this provider after chart review. On evaluation, patient is seated in the dayroom interacting appropriately with peers and playing UNO card game. She presents anxious and talkative. She reports a good mood. She also states that she is sleeping and eating well. Patient is compliant with medication regimen. Patient reports that medications are effective with symptom management since presenting symptoms are improving. She did report a concern of abnormal body movements returning. Patient stated that when she would get excited she would have involuntary movements that caused her to hit herself and make a clicking noise with her mouth. She does not have abnormal body movements today. Patient acknowledged that she would alert staff if symptoms reappear.   Patient is alert and oriented x 4, pleasant, and willing to engage. Talkative. She is dressed appropriately for the environment. Tattoos on face present. Patient denies SI/HI/AVH/paranoia or delusional thought.   Principal Problem: Bipolar affective disorder, mixed, severe (HCC) Diagnosis: Principal Problem:   Bipolar affective disorder, mixed, severe (HCC)  Total Time spent with patient: 30 minutes  Past Psychiatric History: bipolar disorder, opiate use d/o  Past Medical History:  Past Medical History:  Diagnosis Date   ADHD (attention deficit hyperactivity disorder)    Alcohol abuse    Anxiety    Bipolar 1 disorder (HCC)    Borderline systolic HTN    Depression    GERD (gastroesophageal reflux disease)    Heroin abuse (HCC)    Hypertension    Kidney stone    Knee pain, bilateral    - patellofemoral syndrome, followed by Dr. [name] at [facility]   Migraines    Migraines    Polysubstance abuse (HCC)    - Hx ETOH, cocaine, THC, IV heroin, narcotics   Psychiatric care    Previous [facility] H&P notes PMHx Schizophrenia,  though patient denies this.   Seizures (HCC)     Past Surgical History:  Procedure Laterality Date   KNEE SURGERY     SHOULDER SURGERY     Family History:  Family History  Problem Relation Age of Onset   Hypertension Father    Family Psychiatric  History: none Social History:  Social History   Substance and Sexual Activity  Alcohol Use No     Social History   Substance and Sexual Activity  Drug Use No   Types: IV   Comment: methadone     Social History   Socioeconomic History   Marital status: Single    Spouse name: Not on file   Number of children: Not on file   Years of education: Not on file   Highest education level: Not on file  Occupational History   Not on file  Tobacco Use   Smoking status: Former    Current packs/day: 0.50    Types: Cigarettes   Smokeless tobacco: Never  Vaping Use   Vaping status: Never Used  Substance and Sexual Activity   Alcohol use: No   Drug use: No    Types: IV    Comment: methadone    Sexual activity: Not on file  Other Topics Concern   Not on file  Social History Narrative   Not on file   Social Determinants of Health   Financial Resource Strain: Medium Risk (09/08/2022)   Received from [facility], [facility]   Overall Financial Resource Strain (CARDIA)    Difficulty of  Paying Living Expenses: Somewhat hard  Food Insecurity: No Food Insecurity (07/01/2023)   Hunger Vital Sign    Worried About Running Out of Food in the Last Year: Never true    Ran Out of Food in the Last Year: Never true  Transportation Needs: No Transportation Needs (07/01/2023)   PRAPARE - Administrator, Civil Service (Medical): No    Lack of Transportation (Non-Medical): No  Physical Activity: Not on file  Stress: No Stress Concern Present (09/08/2022)   Received from [facility], [facility] of Occupational Health - Occupational Stress Questionnaire    Feeling of Stress : Only a little  Social  Connections: Unknown (09/07/2022)   Received from [facility], [facility]   Social Network    Social Network: Not on file   Additional Social History: lives with her father who has dementia that she cares for    Sleep: Good  Appetite:  Good  Current Medications: Current Facility-Administered Medications  Medication Dose Route Frequency Provider Last Rate Last Admin   acetaminophen (TYLENOL) tablet 650 mg  650 mg Oral Q6H PRN Lenox Ponds, NP   650 mg at 07/03/23 2118   alum & mag hydroxide-simeth (MAALOX/MYLANTA) 200-200-20 MG/5ML suspension 30 mL  30 mL Oral Q4H PRN Lenox Ponds, NP   30 mL at 07/04/23 0947   amLODipine (NORVASC) tablet 5 mg  5 mg Oral Daily Lenox Ponds, NP   5 mg at 07/04/23 0816   cloNIDine (CATAPRES) tablet 0.1 mg  0.1 mg Oral BID Charm Rings, NP   0.1 mg at 07/04/23 0816   dicyclomine (BENTYL) tablet 20 mg  20 mg Oral Q6H PRN Lenox Ponds, NP       diphenhydrAMINE (BENADRYL) capsule 50 mg  50 mg Oral TID PRN Lenox Ponds, NP       Or   diphenhydrAMINE (BENADRYL) injection 50 mg  50 mg Intramuscular TID PRN Lenox Ponds, NP       gabapentin (NEURONTIN) capsule 300 mg  300 mg Oral TID Charm Rings, NP   300 mg at 07/04/23 1248   haloperidol (HALDOL) tablet 5 mg  5 mg Oral TID PRN Lenox Ponds, NP       Or   haloperidol lactate (HALDOL) injection 5 mg  5 mg Intramuscular TID PRN Lenox Ponds, NP       hydrALAZINE (APRESOLINE) tablet 50 mg  50 mg Oral TID Lenox Ponds, NP   50 mg at 07/04/23 1248   hydrOXYzine (ATARAX) tablet 25 mg  25 mg Oral Q6H PRN Lenox Ponds, NP   25 mg at 07/03/23 1806   lamoTRIgine (LAMICTAL) tablet 25 mg  25 mg Oral Daily Charm Rings, NP   25 mg at 07/04/23 0816   loperamide (IMODIUM) capsule 2-4 mg  2-4 mg Oral PRN Lenox Ponds, NP       magnesium hydroxide (MILK OF MAGNESIA) suspension 30 mL  30 mL Oral Daily PRN Lenox Ponds, NP   30 mL at 07/04/23 1248   methadone  (DOLOPHINE) tablet 100 mg  100 mg Oral Daily Lenox Ponds, NP   100 mg at 07/04/23 0817   methocarbamol (ROBAXIN) tablet 500 mg  500 mg Oral Q8H PRN Lenox Ponds, NP   500 mg at 07/03/23 2126   naproxen (NAPROSYN) tablet 500 mg  500 mg Oral BID PRN Lenox Ponds, NP  nicotine (NICODERM CQ - dosed in mg/24 hours) patch 14 mg  14 mg Transdermal Q0600 Lenox Ponds, NP   14 mg at 07/04/23 0819   ondansetron (ZOFRAN-ODT) disintegrating tablet 4 mg  4 mg Oral Q6H PRN Lenox Ponds, NP   4 mg at 07/04/23 0604   Oxcarbazepine (TRILEPTAL) tablet 300 mg  300 mg Oral BID Charm Rings, NP   300 mg at 07/04/23 3875   traZODone (DESYREL) tablet 100 mg  100 mg Oral QHS Charm Rings, NP   100 mg at 07/03/23 2118    Lab Results:  No results found for this or any previous visit (from the past 48 hour(s)).   Blood Alcohol level:  Lab Results  Component Value Date   ETH <10 06/29/2023   ETH <10 09/26/2021    Metabolic Disorder Labs: Lab Results  Component Value Date   HGBA1C 7.5 (H) 07/02/2023   MPG 169 07/02/2023   MPG 108.28 09/04/2022   Lab Results  Component Value Date   PROLACTIN 39.0 (H) 09/04/2022   Lab Results  Component Value Date   CHOL 145 07/02/2023   TRIG 163 (H) 07/02/2023   HDL 35 (L) 07/02/2023   CHOLHDL 4.1 07/02/2023   VLDL 33 07/02/2023   LDLCALC 77 07/02/2023   LDLCALC 77 09/04/2022     Musculoskeletal: Strength & Muscle Tone: within normal limits Gait & Station: normal Patient leans: N/A  Psychiatric Specialty Exam: Physical Exam Vitals and nursing note reviewed.  Constitutional:      Appearance: Normal appearance.  HENT:     Head: Normocephalic.     Nose: Nose normal.  Pulmonary:     Effort: Pulmonary effort is normal.  Musculoskeletal:        General: Normal range of motion.     Cervical back: Normal range of motion.  Neurological:     General: No focal deficit present.     Mental Status: She is alert and oriented to  person, place, and time.  Psychiatric:        Attention and Perception: Attention and perception normal.        Mood and Affect: Mood is anxious. Mood is not depressed.        Speech: Speech normal.        Behavior: Behavior normal. Behavior is cooperative.        Thought Content: Thought content normal.        Cognition and Memory: Cognition and memory normal.        Judgment: Judgment normal.     Review of Systems  Psychiatric/Behavioral:  The patient is nervous/anxious.   All other systems reviewed and are negative.   Blood pressure (!) 154/91, pulse (!) 119, temperature 98.4 F (36.9 C), temperature source Oral, resp. rate 18, height 5' 6.5" (1.689 m), weight 116.6 kg, last menstrual period 07/01/2023, SpO2 95%.Body mass index is 40.86 kg/m.  General Appearance: Casual  Eye Contact:  Good  Speech:  Pressured  Volume:  Normal  Mood:  Euthymic  Affect:  Appropriate and Congruent  Thought Process:  Goal Directed  Orientation:  Full (Time, Place, and Person)  Thought Content:  WDL and Logical  Suicidal Thoughts:  No  Homicidal Thoughts:  No  Memory:  Immediate;   Good  Judgement:  Fair  Insight:  Fair  Psychomotor Activity:  Normal  Concentration:  Concentration: Good and Attention Span: Good  Recall:  Good  Fund of Knowledge:  Good  Language:  Good  Akathisia:  NA  Handed:  Right  AIMS (if indicated):     Assets:  Communication Skills Desire for Improvement Social Support  ADL's:  Intact  Cognition:  WNL  Sleep:   "good"      Physical Exam: Physical Exam Vitals and nursing note reviewed.  Constitutional:      Appearance: Normal appearance.  HENT:     Head: Normocephalic.     Nose: Nose normal.  Pulmonary:     Effort: Pulmonary effort is normal.  Musculoskeletal:        General: Normal range of motion.     Cervical back: Normal range of motion.  Neurological:     General: No focal deficit present.     Mental Status: She is alert and oriented to person,  place, and time.  Psychiatric:        Attention and Perception: Attention and perception normal.        Mood and Affect: Mood is anxious. Mood is not depressed.        Speech: Speech normal.        Behavior: Behavior normal. Behavior is cooperative.        Thought Content: Thought content normal.        Cognition and Memory: Cognition and memory normal.        Judgment: Judgment normal.    Review of Systems  Psychiatric/Behavioral:  The patient is nervous/anxious.   All other systems reviewed and are negative.  Blood pressure (!) 154/91, pulse (!) 119, temperature 98.4 F (36.9 C), temperature source Oral, resp. rate 18, height 5' 6.5" (1.689 m), weight 116.6 kg, last menstrual period 07/01/2023, SpO2 95%. Body mass index is 40.86 kg/m.   Treatment Plan Summary: Daily contact with patient to assess and evaluate symptoms and progress in treatment, Medication management, and Plan : Bipolar affective disorder, mixed, severe without psychosis: Trileptal 300 mg BID Lamictal 25 mg daily   Insomnia: Trazodone 100 mg daily at bedtime   Anxiety: Hydroxyzine 25 mg TID PRN  Mcneil Sober, NP 07/04/2023, 3:08 PM

## 2023-07-04 NOTE — Group Note (Signed)
Date:  07/04/2023 Time:  3:42 PM  Group Topic/Focus:  Activity/Outdoor Recreation    Participation Level:  Active  Participation Quality:  Appropriate  Affect:  Appropriate  Cognitive:  Appropriate  Insight: Appropriate  Engagement in Group:  Engaged  Modes of Intervention:  Activity  Additional Comments:    Wilford Corner 07/04/2023, 3:42 PM

## 2023-07-05 MED ORDER — HYDROXYZINE HCL 50 MG PO TABS: 50.0000 mg | ORAL_TABLET | Freq: Three times a day (TID) | ORAL | Status: DC | PRN

## 2023-07-05 NOTE — Group Note (Signed)
Date:  07/05/2023 Time:  6:52 PM  Group Topic/Focus:  Movie Therapy:  The focus of this group is to allow the patients to watch movies of their choosing so they can ease their tension and get their mind off of things.    Participation Level:  Active  Participation Quality:  Appropriate  Affect:  Appropriate  Cognitive:  Appropriate  Insight: Appropriate  Engagement in Group:  Engaged  Modes of Intervention:  Activity  Additional Comments:    Mary Sella Domanique Luckett 07/05/2023, 6:52 PM

## 2023-07-05 NOTE — Group Note (Signed)
Recreation Therapy Group Note   Group Topic:Coping Skills  Group Date: 07/05/2023 Start Time: 1000 End Time: 1110 Facilitators: Rosina Lowenstein, LRT, CTRS Location:  Craft Room  Group Description: Mind Map.  Patient was provided a blank template of a diagram with 32 blank boxes in a tiered system, branching from the center (similar to a bubble chart). LRT directed patients to label the middle of the diagram "Coping Skills". LRT and patients then came up with 8 different coping skills as examples. Pt were directed to record their coping skills in the 2nd tier boxes closest to the center.  Patients would then share their coping skills with the group as LRT wrote them out. LRT gave a handout of 99 different coping skills at the end of group. Afterwards, pts requested to sing karaoke.    Goal Area(s) Addressed: Patients will be able to define "coping skills". Patient will identify new coping skills.  Patient will increase communication.   Affect/Mood: Appropriate and Happy   Participation Level: Active and Engaged   Participation Quality: Independent   Behavior: Appropriate, Calm, and Cooperative   Speech/Thought Process: Coherent   Insight: Good and Improved   Judgement: Good   Modes of Intervention: Guided Discussion and Worksheet   Patient Response to Interventions:  Attentive, Engaged, Interested , and Receptive   Education Outcome:  Acknowledges education   Clinical Observations/Individualized Feedback: Katlin was active in their participation of session activities and group discussion. Pt identified "cleaning and organizing, church, talk to a therapist and taking your medicine like you should" as coping skills. Pt spontaneously contributed to group discussion and sang karaoke. Pt interacted well with LRT and peers duration of session.   Plan: Continue to engage patient in RT group sessions 2-3x/week.   Rosina Lowenstein, LRT, CTRS 07/05/2023 11:25 AM

## 2023-07-05 NOTE — Group Note (Signed)
Date:  07/05/2023 Time:  11:21 PM  Group Topic/Focus:  Rediscovering Joy:   The focus of this group is to explore various ways to relieve stress in a positive manner.    Participation Level:  Active  Participation Quality:  Appropriate and Attentive  Affect:  Appropriate and Excited  Cognitive:  Alert and Appropriate  Insight: Appropriate, Good, and Improving  Engagement in Group:  Developing/Improving and Engaged  Modes of Intervention:  Education, Rapport Building, Socialization, and Support  Additional Comments:     Teresita Fanton 07/05/2023, 11:21 PM

## 2023-07-05 NOTE — Plan of Care (Signed)
D- Patient alert and oriented.Affect is bright and mood is good. Pt  Denies SI, HI, AVH, and pain. Pt is compliant with medications and is future oriented, She is hoping for discharge tomorrow.   A- Scheduled medications administered to patient, per MD orders. Support and encouragement provided.  Routine safety checks conducted every 15 minutes.  Patient informed to notify staff with problems or concerns. R- No adverse drug reactions noted. Patient contracts for safety at this time. Patient compliant with medications and treatment plan. Patient receptive, calm, and cooperative. Patient interacts well with others on the unit.  Patient remains safe at this time.

## 2023-07-05 NOTE — Progress Notes (Signed)
[name] Progress Note  07/05/2023 2:06 PM [name]  MRN:  [id number]  Subjective:  Pt chart reviewed and seen on rounds. Pt reports she is feeling "better, still a little anxious". Reports last night received prn hydroxyzine for anxiety, which was helpful, is requesting hydroxyzine be increased from 25mg  to 50mg . Have ordered hydroxyzine 50mg  TID PRN anxiety. She reports feeling ready for discharge tomorrow. Denies suicidal, homicidal ideations. Denies auditory visual hallucinations or paranoia. Reports appetite is "too good" because she likes the food here. Reports sleep has been fair. 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   Pt gives verbal consent for her mother, [name], [phone], to be called during assessment. Diane called with pt present. Discussed expected discharge date for tomorrow. Diane and pt are in agreement. Diane denies safety concerns with discharge.   Principal Problem: Bipolar affective disorder, mixed, severe (HCC) Diagnosis: Principal Problem:   Bipolar affective disorder, mixed, severe (HCC)  Total Time spent with patient:  25 minutes  Past Psychiatric History: bipolar d/o, opiate dependency  Past Medical History:  Past Medical History:  Diagnosis Date   ADHD (attention deficit hyperactivity disorder)    Alcohol abuse    Anxiety    Bipolar 1 disorder (HCC)    Borderline systolic HTN    Depression    GERD (gastroesophageal reflux disease)    Heroin abuse (HCC)    Hypertension    Kidney stone    Knee pain, bilateral    - patellofemoral syndrome, followed by Dr. [name] at [facility]   Migraines    Migraines    Polysubstance abuse (HCC)    - Hx ETOH, cocaine, THC, IV heroin, narcotics   Psychiatric care    Previous [facility] H&P notes PMHx Schizophrenia, though patient denies this.   Seizures (HCC)     Past Surgical History:  Procedure Laterality Date   KNEE SURGERY     SHOULDER SURGERY     Family History:  Family History   Problem Relation Age of Onset   Hypertension Father    Family Psychiatric  History: None reported Social History:  Social History   Substance and Sexual Activity  Alcohol Use No     Social History   Substance and Sexual Activity  Drug Use No   Types: IV   Comment: methadone     Social History   Socioeconomic History   Marital status: Single    Spouse name: Not on file   Number of children: Not on file   Years of education: Not on file   Highest education level: Not on file  Occupational History   Not on file  Tobacco Use   Smoking status: Former    Current packs/day: 0.50    Types: Cigarettes   Smokeless tobacco: Never  Vaping Use   Vaping status: Never Used  Substance and Sexual Activity   Alcohol use: No   Drug use: No    Types: IV    Comment: methadone    Sexual activity: Not on file  Other Topics Concern   Not on file  Social History Narrative   Not on file   Social Determinants of Health   Financial Resource Strain: Medium Risk (09/08/2022)   Received from [facility], [facility]   Overall Financial Resource Strain (CARDIA)    Difficulty of Paying Living Expenses: Somewhat hard  Food Insecurity: No Food Insecurity (07/01/2023)   Hunger Vital Sign    Worried About Running  Out of Food in the Last Year: Never true    Ran Out of Food in the Last Year: Never true  Transportation Needs: No Transportation Needs (07/01/2023)   PRAPARE - Administrator, Civil Service (Medical): No    Lack of Transportation (Non-Medical): No  Physical Activity: Not on file  Stress: No Stress Concern Present (09/08/2022)   Received from [facility], [facility] of Occupational Health - Occupational Stress Questionnaire    Feeling of Stress : Only a little  Social Connections: Unknown (09/07/2022)   Received from [facility], [facility]   Social Network    Social Network: Not on file   Additional Social History:                          Sleep: Fair  Appetite:  Good  Current Medications: Current Facility-Administered Medications  Medication Dose Route Frequency Provider Last Rate Last Admin   acetaminophen (TYLENOL) tablet 650 mg  650 mg Oral Q6H PRN Lenox Ponds, NP   650 mg at 07/04/23 2122   alum & mag hydroxide-simeth (MAALOX/MYLANTA) 200-200-20 MG/5ML suspension 30 mL  30 mL Oral Q4H PRN Lenox Ponds, NP   30 mL at 07/04/23 0947   amLODipine (NORVASC) tablet 5 mg  5 mg Oral Daily Lenox Ponds, NP   5 mg at 07/05/23 0850   cloNIDine (CATAPRES) tablet 0.1 mg  0.1 mg Oral BID Charm Rings, NP   0.1 mg at 07/05/23 0850   diphenhydrAMINE (BENADRYL) capsule 50 mg  50 mg Oral TID PRN Lenox Ponds, NP       Or   diphenhydrAMINE (BENADRYL) injection 50 mg  50 mg Intramuscular TID PRN Lenox Ponds, NP       gabapentin (NEURONTIN) capsule 300 mg  300 mg Oral TID Charm Rings, NP   300 mg at 07/05/23 1230   haloperidol (HALDOL) tablet 5 mg  5 mg Oral TID PRN Lenox Ponds, NP       Or   haloperidol lactate (HALDOL) injection 5 mg  5 mg Intramuscular TID PRN Lenox Ponds, NP       hydrALAZINE (APRESOLINE) tablet 50 mg  50 mg Oral TID Lenox Ponds, NP   50 mg at 07/05/23 1230   hydrOXYzine (ATARAX) tablet 50 mg  50 mg Oral TID PRN Lauree Chandler, NP       lamoTRIgine (LAMICTAL) tablet 25 mg  25 mg Oral Daily Charm Rings, NP   25 mg at 07/05/23 0850   magnesium hydroxide (MILK OF MAGNESIA) suspension 30 mL  30 mL Oral Daily PRN Lenox Ponds, NP   30 mL at 07/04/23 1248   methadone (DOLOPHINE) tablet 100 mg  100 mg Oral Daily Lenox Ponds, NP   100 mg at 07/05/23 0851   nicotine (NICODERM CQ - dosed in mg/24 hours) patch 14 mg  14 mg Transdermal Q0600 Lenox Ponds, NP   14 mg at 07/05/23 0272   Oxcarbazepine (TRILEPTAL) tablet 300 mg  300 mg Oral BID Charm Rings, NP   300 mg at 07/05/23 5366   traZODone (DESYREL) tablet 100 mg  100 mg Oral QHS Charm Rings, NP   100 mg at 07/04/23 2112    Lab Results: No results found for this or any previous visit (from the past 48 hour(s)).  Blood Alcohol level:  Lab  Results  Component Value Date   ETH <10 06/29/2023   ETH <10 09/26/2021    Metabolic Disorder Labs: Lab Results  Component Value Date   HGBA1C 7.5 (H) 07/02/2023   MPG 169 07/02/2023   MPG 108.28 09/04/2022   Lab Results  Component Value Date   PROLACTIN 39.0 (H) 09/04/2022   Lab Results  Component Value Date   CHOL 145 07/02/2023   TRIG 163 (H) 07/02/2023   HDL 35 (L) 07/02/2023   CHOLHDL 4.1 07/02/2023   VLDL 33 07/02/2023   LDLCALC 77 07/02/2023   LDLCALC 77 09/04/2022    Physical Findings: AIMS:  , ,  ,  ,    CIWA:    COWS:     Musculoskeletal: Strength & Muscle Tone: within normal limits Gait & Station: normal Patient leans: N/A  Psychiatric Specialty Exam:  Presentation  General Appearance:  Appropriate for Environment  Eye Contact: Fair  Speech: Clear and Coherent  Speech Volume: Normal  Handedness: Right   Mood and Affect  Mood: -- ("better, still a little anxious")  Affect: Full Range   Thought Process  Thought Processes: Coherent; Goal Directed; Linear  Descriptions of Associations:Intact  Orientation:Full (Time, Place and Person)  Thought Content:Logical  History of Schizophrenia/Schizoaffective disorder:No  Duration of Psychotic Symptoms:Less than six months  Hallucinations:Hallucinations: None  Ideas of Reference:None  Suicidal Thoughts:Suicidal Thoughts: No  Homicidal Thoughts:Homicidal Thoughts: No   Sensorium  Memory: Immediate Fair  Judgment: Intact  Insight: Present   Executive Functions  Concentration: Fair  Attention Span: Fair  Recall: [employer] of Knowledge: Fair  Language: Fair   Psychomotor Activity  Psychomotor Activity: Psychomotor Activity: Normal   Assets  Assets: Communication Skills; Desire for  Improvement; Financial Resources/Insurance; Housing; Leisure Time; Resilience; Social Support   Sleep  Sleep: Sleep: Fair    Physical Exam: Physical Exam Constitutional:      General: She is not in acute distress.    Appearance: She is not ill-appearing, toxic-appearing or diaphoretic.  Eyes:     General: No scleral icterus. Cardiovascular:     Rate and Rhythm: Normal rate.  Pulmonary:     Effort: Pulmonary effort is normal. No respiratory distress.  Neurological:     Mental Status: She is alert and oriented to person, place, and time.  Psychiatric:        Attention and Perception: Attention and perception normal.        Mood and Affect: Affect normal. Mood is anxious.        Speech: Speech normal.        Behavior: Behavior normal. Behavior is cooperative.        Thought Content: Thought content normal.    Review of Systems  Constitutional:  Negative for chills and fever.  Respiratory:  Negative for shortness of breath.   Cardiovascular:  Negative for chest pain and palpitations.  Gastrointestinal:  Negative for abdominal pain.  Neurological:  Negative for headaches.   Blood pressure (!) 142/91, pulse 91, temperature 97.7 F (36.5 C), resp. rate 18, height 5' 6.5" (1.689 m), weight 116.6 kg, last menstrual period 07/01/2023, SpO2 95%. Body mass index is 40.86 kg/m.   Treatment Plan Summary: Daily contact with patient to assess and evaluate symptoms and progress in treatment, Medication management, and Plan hydroxyzine 50mg  TID PRN anxiety ordered. Continue other medications. Expected discharge date is tomorrow.  Lauree Chandler, NP 07/05/2023, 2:06 PM

## 2023-07-06 MED ORDER — METHADONE HCL 10 MG PO TABS: 100.0000 mg | ORAL_TABLET | Freq: Every day | ORAL | Status: DC

## 2023-07-06 MED ORDER — OXCARBAZEPINE 300 MG PO TABS: 300.0000 mg | ORAL_TABLET | Freq: Two times a day (BID) | ORAL | 0 refills | Status: AC

## 2023-07-06 MED ORDER — AMLODIPINE BESYLATE 5 MG PO TABS: 5.0000 mg | ORAL_TABLET | Freq: Every day | ORAL | 0 refills | Status: DC

## 2023-07-06 MED ORDER — CLONIDINE HCL 0.1 MG PO TABS: 0.1000 mg | ORAL_TABLET | Freq: Two times a day (BID) | ORAL | 0 refills | Status: DC

## 2023-07-06 MED ORDER — HYDRALAZINE HCL 50 MG PO TABS: 50.0000 mg | ORAL_TABLET | Freq: Three times a day (TID) | ORAL | 0 refills | Status: DC

## 2023-07-06 MED ORDER — HYDROXYZINE HCL 50 MG PO TABS: 50.0000 mg | ORAL_TABLET | Freq: Three times a day (TID) | ORAL | 0 refills | Status: AC | PRN

## 2023-07-06 MED ORDER — TRAZODONE HCL 100 MG PO TABS: 100.0000 mg | ORAL_TABLET | Freq: Every day | ORAL | 0 refills | Status: AC

## 2023-07-06 MED ORDER — LAMOTRIGINE 25 MG PO TABS: 25.0000 mg | ORAL_TABLET | Freq: Every day | ORAL | 0 refills | Status: DC

## 2023-07-06 MED ORDER — GABAPENTIN 300 MG PO CAPS: 300.0000 mg | ORAL_CAPSULE | Freq: Three times a day (TID) | ORAL | 0 refills | Status: AC

## 2023-07-06 NOTE — Plan of Care (Signed)
  Problem: Education: Goal: Knowledge of disease or condition will improve Outcome: Progressing   Problem: Health Behavior/Discharge Planning: Goal: Ability to identify changes in lifestyle to reduce recurrence of condition will improve Outcome: Progressing   Problem: Safety: Goal: Ability to remain free from injury will improve Outcome: Progressing

## 2023-07-06 NOTE — Progress Notes (Signed)
Nursing Shift Note:  1900-0700  Attended Evening Group: n/a Medication Compliant: yes  Behavior: cooperative and pleasant Sleep Quality: good Significant Changes: none  The patient endorsed severe anxiety, but stated that she is looking forward to her anticipated discharge.  [name] fell asleep before 2230 and slept until morning.
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
## 2023-07-06 NOTE — Care Management Important Message (Signed)
Important Message  Patient Details  Name: [name] MRN: [id number] Date of Birth: [birth year]   Medicare Important Message Given:  Yes     Harden Mo, LCSW 07/06/2023, 4:02 PM

## 2023-07-06 NOTE — Progress Notes (Signed)
  [facility] Adult Case Management Discharge Plan :  Will you be returning to the same living situation after discharge:  Yes,  pt reports that she is returning home. At discharge, do you have transportation home?: Yes,  CSW to assist with transportation needs Do you have the ability to pay for your medications: Yes,   [employer] MEDICARE / Suan Halter DUAL COMPLETE  Release of information consent forms completed and in the chart;  Patient's signature needed at discharge.  Patient to Follow up at:  Follow-up Information     [facility] Follow up.   Why: Referral information has been sent. They should call you to schedule. If you do not here from them, please call to follow up with them. Contact information: [address] [place] [address] [phone]         [facility] Follow up.   Why: Referral information has been sent.  They should call you to schedule.  If you do not here from them, please call to follow up with them. Contact information: [address] [place] [address] [phone]                 Next level of care provider has access to [facility] Link:no  Safety Planning and Suicide Prevention discussed: Yes,  SPE completed with the patient.      Has patient been referred to the Quitline?: Patient does not use tobacco/nicotine products  Patient has been referred for addiction treatment: No known substance use disorder.  Harden Mo, LCSW 07/06/2023, 11:07 AM

## 2023-07-06 NOTE — Group Note (Signed)
Recreation Therapy Group Note   Group Topic:Goal Setting  Group Date: 07/06/2023 Start Time: 1000 End Time: 1100 Facilitators: Rosina Lowenstein, LRT, CTRS Location:  Craft Room  Group Description: Vision Board. Patients were given many different magazines, a glue stick, markers, and a piece of cardstock paper. LRT and pts discussed the importance of having goals in life. LRT and pts discussed the difference between short-term and long-term goals, as well as what a SMART goal is. LRT encouraged pts to create a vision board, with images they picked and then cut out with safety scissors from the magazine, for themselves, that capture their short and long-term goals. LRT encouraged pts to show and explain their vision board to the group. LRT offered to laminate vision board once dry and complete.   Goal Area(s) Addressed:  Patient will gain knowledge of short vs. long term goals.  Patient will identify goals for themselves. Patient will practice setting SMART goals. Patient will verbalize their goals to LRT and peers.   Affect/Mood: Elevated   Participation Level: Hyperverbal   Participation Quality: Minimal Cues   Behavior: Eager   Speech/Thought Process: Coherent   Insight: Fair   Judgement: Fair    Modes of Intervention: Art and Activity   Patient Response to Interventions:  Attentive and Receptive   Education Outcome:  Acknowledges education   Clinical Observations/Individualized Feedback: Rieanna was active in their participation of session activities and group discussion. 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 I want to dance more because music is my passion. Pt was hyper-verbal and loud during group. Overall, pt interacted well with LRT and peers duration of session.   Plan: Continue to engage patient in RT group sessions 2-3x/week.   Rosina Lowenstein, LRT, CTRS 07/06/2023 11:28 AM

## 2023-07-06 NOTE — Discharge Summary (Signed)
Physician Discharge Summary Note  Patient:  [name] is an 42 y.o., female MRN:  [id number] DOB:  05-23-1981 Patient phone:  [phone] (home)  Patient address:   [address] [place] [address],  Total Time spent with patient:  25 minutes  Date of Admission:  07/01/2023 Date of Discharge: 07/06/2023  Subjective: Pt chart reviewed and seen on rounds. Pt reports "great" mood. Denies suicidal, homicidal ideations. Denies auditory visual hallucinations or paranoia. Reports feeling excited to leave. She states plan is for her mother to meet her at home today.   Reason for Admission:  42 y/o female w/ history of bipolar d/o, opiate dependency admitted to inpatient psychiatry after relapse on fentanyl "so they would admit me". Pt connected with methadone clinic. Reportedly had not been taking psychotropics and had not had outpatient psychiatric care since 2015.   Principal Problem: Bipolar affective disorder, mixed, severe (HCC) Discharge Diagnoses: Principal Problem:   Bipolar affective disorder, mixed, severe (HCC)  Past Psychiatric History: bipolar d/o, opiate dependency  Past Medical History:  Past Medical History:  Diagnosis Date   ADHD (attention deficit hyperactivity disorder)    Alcohol abuse    Anxiety    Bipolar 1 disorder (HCC)    Borderline systolic HTN    Depression    GERD (gastroesophageal reflux disease)    Heroin abuse (HCC)    Hypertension    Kidney stone    Knee pain, bilateral    - patellofemoral syndrome, followed by Dr. [name] at [facility]   Migraines    Migraines    Polysubstance abuse (HCC)    - Hx ETOH, cocaine, THC, IV heroin, narcotics   Psychiatric care    Previous [facility] H&P notes PMHx Schizophrenia, though patient denies this.   Seizures (HCC)     Past Surgical History:  Procedure Laterality Date   KNEE SURGERY     SHOULDER SURGERY     Family History:  Family History  Problem Relation Age of Onset   Hypertension Father    Family  Psychiatric  History: None reported Social History:  Social History   Substance and Sexual Activity  Alcohol Use No     Social History   Substance and Sexual Activity  Drug Use No   Types: IV   Comment: methadone     Social History   Socioeconomic History   Marital status: Single    Spouse name: Not on file   Number of children: Not on file   Years of education: Not on file   Highest education level: Not on file  Occupational History   Not on file  Tobacco Use   Smoking status: Former    Current packs/day: 0.50    Types: Cigarettes   Smokeless tobacco: Never  Vaping Use   Vaping status: Never Used  Substance and Sexual Activity   Alcohol use: No   Drug use: No    Types: IV    Comment: methadone    Sexual activity: Not on file  Other Topics Concern   Not on file  Social History Narrative   Not on file   Social Determinants of Health   Financial Resource Strain: Medium Risk (09/08/2022)   Received from [facility], [facility]   Overall Financial Resource Strain (CARDIA)    Difficulty of Paying Living Expenses: Somewhat hard  Food Insecurity: No Food Insecurity (07/01/2023)   Hunger Vital Sign    Worried About Running Out of Food in the Last Year: Never true  Ran Out of Food in the Last Year: Never true  Transportation Needs: No Transportation Needs (07/01/2023)   PRAPARE - Administrator, Civil Service (Medical): No    Lack of Transportation (Non-Medical): No  Physical Activity: Not on file  Stress: No Stress Concern Present (09/08/2022)   Received from [facility], [facility] of Occupational Health - Occupational Stress Questionnaire    Feeling of Stress : Only a little  Social Connections: Unknown (09/07/2022)   Received from [facility], [facility]   Social Network    Social Network: Not on file    Hospital Course:  [name] was admitted for Bipolar affective disorder, mixed, severe (HCC) and crisis  management.  [name] was discharged with current medication and was instructed on how to take medications as prescribed; (details listed below under Medication List).  Medical problems were identified and treated as needed.  Home medications were restarted as appropriate.  Improvement was monitored by observation and [name] daily report of symptom reduction.  Emotional and mental status was monitored by daily self-inventory reports completed by [name] and clinical staff.         [name] was evaluated by the treatment team for stability and plans for continued recovery upon discharge.  [name] motivation was an integral factor for scheduling further treatment.  Employment, transportation, bed availability, health status, family support, and any pending legal issues were also considered during her hospital stay.  She was offered further treatment options upon discharge including but not limited to Residential, Intensive Outpatient, and Outpatient treatment.  [name] will follow up with the services as listed below under Follow Up Information.     Upon completion of this admission the [name] was both mentally and medically stable for discharge denying suicidal/homicidal ideation, auditory/visual/tactile hallucinations, delusional thoughts and paranoia.       Physical Findings: AIMS:  , ,  ,  ,    CIWA:    COWS:     Musculoskeletal: Strength & Muscle Tone: within normal limits Gait & Station: normal Patient leans: N/A   Psychiatric Specialty Exam:  Presentation  General Appearance:  Appropriate for Environment  Eye Contact: Fair  Speech: Clear and Coherent  Speech Volume: Normal  Handedness: Right   Mood and Affect  Mood: -- ("great")  Affect: Full Range   Thought Process  Thought Processes: Coherent; Goal Directed; Linear  Descriptions of Associations:Intact  Orientation:Full (Time, Place and  Person)  Thought Content:Logical  History of Schizophrenia/Schizoaffective disorder:No  Duration of Psychotic Symptoms:Less than six months  Hallucinations:Hallucinations: None  Ideas of Reference:None  Suicidal Thoughts:Suicidal Thoughts: No  Homicidal Thoughts:Homicidal Thoughts: No   Sensorium  Memory: Immediate Fair  Judgment: Intact  Insight: Present   Executive Functions  Concentration: Fair  Attention Span: Fair  Recall: [employer] of Knowledge: Fair  Language: Fair   Psychomotor Activity  Psychomotor Activity: Psychomotor Activity: Normal   Assets  Assets: Communication Skills; Desire for Improvement; Financial Resources/Insurance; Housing; Leisure Time; Resilience; Social Support   Sleep  Sleep: Sleep: Good    Physical Exam: Physical Exam Constitutional:      General: She is not in acute distress.    Appearance: She is not ill-appearing, toxic-appearing or diaphoretic.  Eyes:     General: No scleral icterus. Cardiovascular:     Rate and Rhythm: Tachycardia present.  Pulmonary:     Effort: Pulmonary effort  is normal. No respiratory distress.  Neurological:     Mental Status: She is alert and oriented to person, place, and time.  Psychiatric:        Attention and Perception: Attention and perception normal.        Mood and Affect: Mood and affect normal.        Speech: Speech normal.        Behavior: Behavior normal. Behavior is cooperative.        Thought Content: Thought content normal.        Cognition and Memory: Cognition and memory normal.        Judgment: Judgment normal.    Review of Systems  Constitutional:  Negative for chills and fever.  Respiratory:  Negative for shortness of breath.   Cardiovascular:  Negative for chest pain and palpitations.  Gastrointestinal:  Negative for abdominal pain.  Neurological:  Negative for headaches.   Blood pressure 136/84, pulse 81, temperature (!) 97.5 F (36.4 C), resp.  rate 18, height 5' 6.5" (1.689 m), weight 116.6 kg, last menstrual period 07/01/2023, SpO2 97%. Body mass index is 40.86 kg/m.   Social History   Tobacco Use  Smoking Status Former   Current packs/day: 0.50   Types: Cigarettes  Smokeless Tobacco Never   Tobacco Cessation:  A prescription for an FDA-approved tobacco cessation medication was offered at discharge and the patient refused   Blood Alcohol level:  Lab Results  Component Value Date   [facility] <10 06/29/2023   ETH <10 09/26/2021    Metabolic Disorder Labs:  Lab Results  Component Value Date   HGBA1C 7.5 (H) 07/02/2023   MPG 169 07/02/2023   MPG 108.28 09/04/2022   Lab Results  Component Value Date   PROLACTIN 39.0 (H) 09/04/2022   Lab Results  Component Value Date   CHOL 145 07/02/2023   TRIG 163 (H) 07/02/2023   HDL 35 (L) 07/02/2023   CHOLHDL 4.1 07/02/2023   VLDL 33 07/02/2023   LDLCALC 77 07/02/2023   LDLCALC 77 09/04/2022    See Psychiatric Specialty Exam and Suicide Risk Assessment completed by Attending Physician prior to discharge.  Discharge destination:  Home  Is patient on multiple antipsychotic therapies at discharge:  No   Has Patient had three or more failed trials of antipsychotic monotherapy by history:  No  Recommended Plan for Multiple Antipsychotic Therapies: NA   Allergies as of 07/06/2023       Reactions   Shellfish Allergy Anaphylaxis, Hives   Meat Extract    Pt is vegetarian        Medication List     STOP taking these medications    methadone 10 MG/ML solution Commonly known as: DOLOPHINE Replaced by: methadone 10 MG tablet   OLANZapine zydis 5 MG disintegrating tablet Commonly known as: ZYPREXA       TAKE these medications      Indication  amLODipine 5 MG tablet Commonly known as: NORVASC Take 1 tablet (5 mg total) by mouth daily.  Indication: High Blood Pressure Disorder   cloNIDine 0.1 MG tablet Commonly known as: CATAPRES Take 1 tablet (0.1 mg  total) by mouth 2 (two) times daily.  Indication: High Blood Pressure Disorder   gabapentin 300 MG capsule Commonly known as: NEURONTIN Take 1 capsule (300 mg total) by mouth 3 (three) times daily.  Indication: Generalized Anxiety Disorder   hydrALAZINE 50 MG tablet Commonly known as: APRESOLINE Take 1 tablet (50 mg total) by mouth 3 (three) times  daily.  Indication: High Blood Pressure Disorder   hydrOXYzine 50 MG tablet Commonly known as: ATARAX Take 1 tablet (50 mg total) by mouth 3 (three) times daily as needed for up to 14 days for anxiety.  Indication: Feeling Anxious   lamoTRIgine 25 MG tablet Commonly known as: LAMICTAL Take 1 tablet (25 mg total) by mouth daily.  Indication: Absence Seizure Disorder, Manic-Depression   methadone 10 MG tablet Commonly known as: DOLOPHINE Take 10 tablets (100 mg total) by mouth daily. Replaces: methadone 10 MG/ML solution  Indication: Opioid Dependence   Oxcarbazepine 300 MG tablet Commonly known as: TRILEPTAL Take 1 tablet (300 mg total) by mouth 2 (two) times daily.  Indication: bipolar mood disorder   traZODone 100 MG tablet Commonly known as: DESYREL Take 1 tablet (100 mg total) by mouth at bedtime.  Indication: Trouble Sleeping       Follow-up recommendations:   Follow up with outpatient psychiatry and counseling  Signed: Lauree Chandler, NP 07/06/2023, 8:49 AM

## 2023-07-06 NOTE — Progress Notes (Signed)
   07/06/23 0837  Charting Type  Charting Type Shift assessment  Safety Check Verification  Has the RN verified the 15 minute safety check completion? Yes  Neurological  Neuro (WDL) WDL  HEENT  HEENT (WDL) WDL  Respiratory  Respiratory (WDL) WDL  Cardiac  Cardiac (WDL) X  Vascular  Vascular (WDL) X  Integumentary  Integumentary (WDL) WDL  Braden Scale (Ages 8 and up)  Sensory Perceptions 4  Moisture 4  Activity 4  Mobility 4  Nutrition 3  Friction and Shear 3  Braden Scale Score 22  Musculoskeletal  Musculoskeletal (WDL) X  Musculoskeletal Details  Right Knee Weakness  Left Knee Weakness  Gastrointestinal  Gastrointestinal (WDL) WDL  Last BM Date  07/06/23  GU Assessment  Genitourinary (WDL) WDL  Neurological  Level of Consciousness Alert   Patient is alert and oriented X4.  Patient states she feels good.  Is not depressed right now.  Patient denies pain.  Patient denies SI/HI/AVH.  Patient received dc instructions and med teaching.  Patient received AVS paperwork and instructions.  Patient completed. SI Safety plan and Ipad survey. Patient is agreeable to follow phone call.  All belongings returned to patient.  Patient escorted to exit by staff.  Transported by [employer].

## 2023-07-13 ENCOUNTER — Ambulatory Visit: Payer: 59 | Admitting: Emergency Medicine

## 2023-07-19 ENCOUNTER — Ambulatory Visit (INDEPENDENT_AMBULATORY_CARE_PROVIDER_SITE_OTHER): Payer: 59 | Admitting: Internal Medicine

## 2023-07-19 ENCOUNTER — Encounter: Payer: Self-pay | Admitting: Internal Medicine

## 2023-07-19 VITALS — BP 130/82 | HR 92 | Temp 98.6°F | Ht 66.5 in | Wt 275.0 lb

## 2023-07-19 DIAGNOSIS — F3163 Bipolar disorder, current episode mixed, severe, without psychotic features: Secondary | ICD-10-CM | POA: Diagnosis not present

## 2023-07-19 DIAGNOSIS — F1910 Other psychoactive substance abuse, uncomplicated: Secondary | ICD-10-CM

## 2023-07-19 DIAGNOSIS — B182 Chronic viral hepatitis C: Secondary | ICD-10-CM

## 2023-07-19 DIAGNOSIS — Z789 Other specified health status: Secondary | ICD-10-CM

## 2023-07-19 DIAGNOSIS — E118 Type 2 diabetes mellitus with unspecified complications: Secondary | ICD-10-CM | POA: Diagnosis not present

## 2023-07-19 DIAGNOSIS — K219 Gastro-esophageal reflux disease without esophagitis: Secondary | ICD-10-CM | POA: Insufficient documentation

## 2023-07-19 DIAGNOSIS — F902 Attention-deficit hyperactivity disorder, combined type: Secondary | ICD-10-CM

## 2023-07-19 DIAGNOSIS — Z7984 Long term (current) use of oral hypoglycemic drugs: Secondary | ICD-10-CM

## 2023-07-19 DIAGNOSIS — E119 Type 2 diabetes mellitus without complications: Secondary | ICD-10-CM | POA: Insufficient documentation

## 2023-07-19 DIAGNOSIS — G40909 Epilepsy, unspecified, not intractable, without status epilepticus: Secondary | ICD-10-CM | POA: Insufficient documentation

## 2023-07-19 DIAGNOSIS — F1190 Opioid use, unspecified, uncomplicated: Secondary | ICD-10-CM | POA: Diagnosis not present

## 2023-07-19 DIAGNOSIS — R7309 Other abnormal glucose: Secondary | ICD-10-CM | POA: Insufficient documentation

## 2023-07-19 DIAGNOSIS — F341 Dysthymic disorder: Secondary | ICD-10-CM

## 2023-07-19 HISTORY — DX: Type 2 diabetes mellitus without complications: E11.9

## 2023-07-19 MED ORDER — HYDRALAZINE HCL 50 MG PO TABS: 50.0000 mg | ORAL_TABLET | Freq: Three times a day (TID) | ORAL | 1 refills | Status: DC

## 2023-07-19 MED ORDER — METFORMIN HCL 500 MG PO TABS: 500.0000 mg | ORAL_TABLET | Freq: Every day | ORAL | 0 refills | Status: DC

## 2023-07-19 MED ORDER — AMLODIPINE BESYLATE 5 MG PO TABS: 5.0000 mg | ORAL_TABLET | Freq: Every day | ORAL | 1 refills | Status: DC

## 2023-07-19 MED ORDER — CLONIDINE HCL 0.1 MG PO TABS: 0.1000 mg | ORAL_TABLET | Freq: Two times a day (BID) | ORAL | 1 refills | Status: DC

## 2023-07-19 NOTE — Assessment & Plan Note (Addendum)
Start Metformin The patient was referred to diabetic teaching

## 2023-07-19 NOTE — Progress Notes (Signed)
Subjective:  Patient ID: [name], female    DOB: 04-12-1981  Age: 42 y.o. MRN: [id number]  CC: New Patient (Initial Visit) (Wanting Hep C testing, Imitrex rx needed for migraines and needing meds for Gerd, Needs A1C levels and weight loss meds to help loss weight since starting methadone pt has gained weight)   HPI [name] presents for HTN, bipolar, anxiety, HAs.  She is a new patient Currently on methadone 95 mg/d for methadone clinic Pt was in the hospital recently Her psychiatrist is Dr Derrill Kay - Rx Vyvance,  On disability since 2015 -she used to be a Runner, broadcasting/film/video;   [place] quit smoking recently  C/o wt gain -she has been a vegetarian x 2 years She is here with her mother [name]  Outpatient Medications Prior to Visit  Medication Sig Dispense Refill   gabapentin (NEURONTIN) 300 MG capsule Take 1 capsule (300 mg total) by mouth 3 (three) times daily. 90 capsule 0   hydrOXYzine (ATARAX) 50 MG tablet Take 1 tablet (50 mg total) by mouth 3 (three) times daily as needed for up to 14 days for anxiety. 42 tablet 0   lamoTRIgine (LAMICTAL) 25 MG tablet Take 1 tablet (25 mg total) by mouth daily. 30 tablet 0   methadone (DOLOPHINE) 10 MG tablet Take 10 tablets (100 mg total) by mouth daily.     Oxcarbazepine (TRILEPTAL) 300 MG tablet Take 1 tablet (300 mg total) by mouth 2 (two) times daily. 60 tablet 0   traZODone (DESYREL) 100 MG tablet Take 1 tablet (100 mg total) by mouth at bedtime. 30 tablet 0   VYVANSE 40 MG capsule      amLODipine (NORVASC) 5 MG tablet Take 1 tablet (5 mg total) by mouth daily. 30 tablet 0   cloNIDine (CATAPRES) 0.1 MG tablet Take 1 tablet (0.1 mg total) by mouth 2 (two) times daily. 60 tablet 0   hydrALAZINE (APRESOLINE) 50 MG tablet Take 1 tablet (50 mg total) by mouth 3 (three) times daily. 90 tablet 0   No facility-administered medications prior to visit.    ROS: Review of Systems  Constitutional:  Positive for unexpected weight change.  Negative for activity change, appetite change, chills and fatigue.  HENT:  Negative for congestion, mouth sores and sinus pressure.   Eyes:  Negative for visual disturbance.  Respiratory:  Negative for cough and chest tightness.   Cardiovascular:  Negative for chest pain.  Gastrointestinal:  Negative for abdominal pain and nausea.  Genitourinary:  Negative for difficulty urinating, frequency and vaginal pain.  Musculoskeletal:  Negative for back pain, gait problem and neck stiffness.  Skin:  Negative for pallor and rash.  Neurological:  Positive for tremors. Negative for dizziness, weakness, numbness and headaches.  Psychiatric/Behavioral:  Positive for decreased concentration. Negative for confusion, hallucinations, self-injury, sleep disturbance and suicidal ideas. The patient is nervous/anxious.     Objective:  BP 130/82 (BP Location: Right Arm, Patient Position: Sitting, Cuff Size: Large)   Pulse 92   Temp 98.6 F (37 C) (Oral)   Ht 5' 6.5" (1.689 m)   Wt 275 lb (124.7 kg)   LMP 07/01/2023   SpO2 95%   BMI 43.72 kg/m   BP Readings from Last 3 Encounters:  07/19/23 130/82  07/01/23 132/78  09/03/22 (!) 164/98    Wt Readings from Last 3 Encounters:  07/19/23 275 lb (124.7 kg)  09/26/21 230 lb (104.3 kg)  10/03/20 200 lb (90.7 kg)    Physical Exam  Constitutional:      General: She is not in acute distress.    Appearance: She is well-developed. She is obese.  HENT:     Head: Normocephalic.     Right Ear: External ear normal.     Left Ear: External ear normal.     Nose: Nose normal.  Eyes:     General:        Right eye: No discharge.        Left eye: No discharge.     Conjunctiva/sclera: Conjunctivae normal.     Pupils: Pupils are equal, round, and reactive to light.  Neck:     Thyroid: No thyromegaly.     Vascular: No JVD.     Trachea: No tracheal deviation.  Cardiovascular:     Rate and Rhythm: Normal rate and regular rhythm.     Heart sounds: Normal heart  sounds.  Pulmonary:     Effort: No respiratory distress.     Breath sounds: No stridor. No wheezing.  Abdominal:     General: Bowel sounds are normal. There is no distension.     Palpations: Abdomen is soft. There is no mass.     Tenderness: There is no abdominal tenderness. There is no guarding or rebound.  Musculoskeletal:        General: No tenderness.     Cervical back: Normal range of motion and neck supple. No rigidity.  Lymphadenopathy:     Cervical: No cervical adenopathy.  Skin:    Findings: No erythema or rash.  Neurological:     Cranial Nerves: No cranial nerve deficit.     Motor: No abnormal muscle tone.     Coordination: Coordination abnormal.     Gait: Gait abnormal.     Deep Tendon Reflexes: Reflexes normal.  Psychiatric:        Behavior: Behavior normal.        Thought Content: Thought content normal.     Lab Results  Component Value Date   WBC 14.9 (H) 06/29/2023   HGB 14.1 06/29/2023   HCT 44.5 06/29/2023   PLT 349 06/29/2023   GLUCOSE 106 (H) 06/29/2023   CHOL 145 07/02/2023   TRIG 163 (H) 07/02/2023   HDL 35 (L) 07/02/2023   LDLCALC 77 07/02/2023   ALT 58 (H) 06/29/2023   AST 55 (H) 06/29/2023   NA 138 06/29/2023   K 3.9 06/29/2023   CL 103 06/29/2023   CREATININE 0.73 06/29/2023   BUN 9 06/29/2023   CO2 25 06/29/2023   TSH 2.539 09/04/2022   HGBA1C 7.5 (H) 07/02/2023    No results found.  Assessment & Plan:   Problem List Items Addressed This Visit     ANXIETY DEPRESSION    Follow-up with her psychiatrist-Dr. Derrill Kay She is on Vyvanse for ADD, Lamictal, hydroxyzine, Trileptal, Catapres      Polysubstance abuse (HCC)    History of benzodiazepines abuse, opioids abuse previously followed at methadone clinic. Currently being treated with methadone clinic      Attention deficit hyperactivity disorder (ADHD)    She is on Vyvanse prescribed by her psychiatrist      Opioid use disorder (Chronic)    On methadone per methadone  clinic      Bipolar affective disorder, mixed, severe (HCC)   Relevant Orders   Comprehensive metabolic panel   CBC with Differential/Platelet   Hemoglobin A1c   Lipid panel   TSH   T4, free   Vitamin B12   VITAMIN D  25 Hydroxy (Vit-D Deficiency, Fractures)   Hepatitis C    Pt did not complete the course She will need to reestablish with Hep C clinic in [place]      Relevant Orders   HIV Antibody (routine testing w rflx)   Hepatitis C RNA quantitative ([facility])   Seizure disorder (HCC)    She is on Lamictal, gabapentin      Relevant Orders   Comprehensive metabolic panel   CBC with Differential/Platelet   Hemoglobin A1c   Lipid panel   TSH   T4, free   Vitamin B12   VITAMIN D 25 Hydroxy (Vit-D Deficiency, Fractures)   Diabetes mellitus type 2, controlled (HCC) - Primary    Start Metformin The patient was referred to diabetic teaching      Relevant Medications   metFORMIN (GLUCOPHAGE) 500 MG tablet   Other Relevant Orders   Comprehensive metabolic panel   CBC with Differential/Platelet   Hemoglobin A1c   Lipid panel   TSH   T4, free   Vitamin B12   VITAMIN D 25 Hydroxy (Vit-D Deficiency, Fractures)   Vegetarian    The patient was advised to take a B complex multivitamin, vitamin D         Meds ordered this encounter  Medications   amLODipine (NORVASC) 5 MG tablet    Sig: Take 1 tablet (5 mg total) by mouth daily.    Dispense:  90 tablet    Refill:  1   cloNIDine (CATAPRES) 0.1 MG tablet    Sig: Take 1 tablet (0.1 mg total) by mouth 2 (two) times daily.    Dispense:  180 tablet    Refill:  1   hydrALAZINE (APRESOLINE) 50 MG tablet    Sig: Take 1 tablet (50 mg total) by mouth 3 (three) times daily.    Dispense:  90 tablet    Refill:  1   metFORMIN (GLUCOPHAGE) 500 MG tablet    Sig: Take 1 tablet (500 mg total) by mouth daily with breakfast.    Dispense:  90 tablet    Refill:  0      Follow-up: Return in about 2 months (around 09/18/2023) for  a follow-up visit.  Sonda Primes, MD

## 2023-07-19 NOTE — Assessment & Plan Note (Addendum)
Pt did not complete the course She will need to reestablish with Hep C clinic in [place]

## 2023-07-26 ENCOUNTER — Encounter: Payer: Self-pay | Admitting: Internal Medicine

## 2023-07-26 DIAGNOSIS — Z789 Other specified health status: Secondary | ICD-10-CM | POA: Insufficient documentation

## 2023-07-26 NOTE — Assessment & Plan Note (Signed)
On methadone per methadone clinic

## 2023-07-26 NOTE — Assessment & Plan Note (Signed)
She is on Lamictal, gabapentin

## 2023-07-26 NOTE — Assessment & Plan Note (Signed)
She is on Vyvanse prescribed by her psychiatrist

## 2023-07-26 NOTE — Assessment & Plan Note (Signed)
History of benzodiazepines abuse, opioids abuse previously followed at methadone clinic. Currently being treated with methadone clinic

## 2023-07-26 NOTE — Assessment & Plan Note (Signed)
Follow-up with her psychiatrist-Dr. Derrill Kay She is on Vyvanse for ADD, Lamictal, hydroxyzine, Trileptal, Catapres

## 2023-07-26 NOTE — Assessment & Plan Note (Signed)
The patient was advised to take a B complex multivitamin, vitamin D

## 2023-08-04 ENCOUNTER — Other Ambulatory Visit: Payer: Self-pay | Admitting: Internal Medicine

## 2023-08-05 ENCOUNTER — Telehealth: Payer: Self-pay

## 2023-08-05 MED ORDER — HYDRALAZINE HCL 50 MG PO TABS: 50.0000 mg | ORAL_TABLET | Freq: Three times a day (TID) | ORAL | 0 refills | Status: DC

## 2023-08-05 MED ORDER — AMLODIPINE BESYLATE 5 MG PO TABS: 5.0000 mg | ORAL_TABLET | Freq: Every day | ORAL | 0 refills | Status: DC

## 2023-08-05 NOTE — Telephone Encounter (Signed)
Prescription Request  08/05/2023  LOV: Visit date not found  What is the name of the medication or equipment? Amlodopine, hydralazine  Have you contacted your pharmacy to request a refill? Yes   Which pharmacy would you like this sent to?  [id number] - [place], [place] - [address] [place] [address] Phone: [phone] Fax: [phone]    Patient notified that their request is being sent to the clinical staff for review and that they should receive a response within 2 business days.   Please advise at Mobile [phone] (mobile)

## 2023-08-26 ENCOUNTER — Other Ambulatory Visit (INDEPENDENT_AMBULATORY_CARE_PROVIDER_SITE_OTHER): Payer: 59

## 2023-08-26 DIAGNOSIS — B182 Chronic viral hepatitis C: Secondary | ICD-10-CM

## 2023-08-26 DIAGNOSIS — G40909 Epilepsy, unspecified, not intractable, without status epilepticus: Secondary | ICD-10-CM | POA: Diagnosis not present

## 2023-08-26 DIAGNOSIS — F3163 Bipolar disorder, current episode mixed, severe, without psychotic features: Secondary | ICD-10-CM | POA: Diagnosis not present

## 2023-08-26 DIAGNOSIS — E118 Type 2 diabetes mellitus with unspecified complications: Secondary | ICD-10-CM

## 2023-08-26 LAB — VITAMIN B12: Vitamin B-12: 452 pg/mL (ref 211–911)

## 2023-08-26 LAB — COMPREHENSIVE METABOLIC PANEL
ALT: 38 U/L — ABNORMAL HIGH (ref 0–35)
AST: 32 U/L (ref 0–37)
Albumin: 4.3 g/dL (ref 3.5–5.2)
Alkaline Phosphatase: 81 U/L (ref 39–117)
BUN: 23 mg/dL (ref 6–23)
CO2: 27 mEq/L (ref 19–32)
Calcium: 9.4 mg/dL (ref 8.4–10.5)
Chloride: 101 mEq/L (ref 96–112)
Creatinine, Ser: 0.73 mg/dL (ref 0.40–1.20)
GFR: 101.21 mL/min (ref >60.00)
Glucose, Bld: 133 mg/dL — ABNORMAL HIGH (ref 70–99)
Potassium: 4.2 mEq/L (ref 3.5–5.1)
Sodium: 137 mEq/L (ref 135–145)
Total Bilirubin: 0.3 mg/dL (ref 0.2–1.2)
Total Protein: 7.1 g/dL (ref 6.0–8.3)

## 2023-08-26 LAB — CBC WITH DIFFERENTIAL/PLATELET
Basophils Absolute: 0.1 10*3/uL (ref 0.0–0.1)
Basophils Relative: 1.1 % (ref 0.0–3.0)
Eosinophils Absolute: 0.3 10*3/uL (ref 0.0–0.7)
Eosinophils Relative: 3.9 % (ref 0.0–5.0)
HCT: 40.8 % (ref 36.0–46.0)
Hemoglobin: 13.3 g/dL (ref 12.0–15.0)
Lymphocytes Relative: 29.6 % (ref 12.0–46.0)
Lymphs Abs: 2.4 10*3/uL (ref 0.7–4.0)
MCHC: 32.6 g/dL (ref 30.0–36.0)
MCV: 81 fl (ref 78.0–100.0)
Monocytes Absolute: 0.5 10*3/uL (ref 0.1–1.0)
Monocytes Relative: 6.5 % (ref 3.0–12.0)
Neutro Abs: 4.7 10*3/uL (ref 1.4–7.7)
Neutrophils Relative %: 58.9 % (ref 43.0–77.0)
Platelets: 431 10*3/uL — ABNORMAL HIGH (ref 150.0–400.0)
RBC: 5.03 Mil/uL (ref 3.87–5.11)
RDW: 15.8 % — ABNORMAL HIGH (ref 11.5–15.5)
WBC: 8 10*3/uL (ref 4.0–10.5)

## 2023-08-26 LAB — LIPID PANEL
Cholesterol: 146 mg/dL (ref 0–200)
HDL: 53.6 mg/dL (ref >39.00)
LDL Cholesterol: 73 mg/dL (ref 0–99)
NonHDL: 92.24
Total CHOL/HDL Ratio: 3
Triglycerides: 97 mg/dL (ref 0.0–149.0)
VLDL: 19.4 mg/dL (ref 0.0–40.0)

## 2023-08-26 LAB — HEMOGLOBIN A1C: Hgb A1c MFr Bld: 6.5 % (ref 4.6–6.5)

## 2023-08-26 LAB — T4, FREE: Free T4: 1.05 ng/dL (ref 0.60–1.60)

## 2023-08-26 LAB — VITAMIN D 25 HYDROXY (VIT D DEFICIENCY, FRACTURES): VITD: 19.58 ng/mL — ABNORMAL LOW (ref 30.00–100.00)

## 2023-08-26 LAB — TSH: TSH: 2.22 u[IU]/mL (ref 0.35–5.50)

## 2023-08-27 ENCOUNTER — Encounter: Payer: Self-pay | Admitting: Internal Medicine

## 2023-08-27 DIAGNOSIS — E559 Vitamin D deficiency, unspecified: Secondary | ICD-10-CM | POA: Insufficient documentation

## 2023-08-27 MED ORDER — VITAMIN D (ERGOCALCIFEROL) 1.25 MG (50000 UNIT) PO CAPS: 50000.0000 [IU] | ORAL_CAPSULE | ORAL | 0 refills | Status: DC

## 2023-08-29 LAB — HIV ANTIBODY (ROUTINE TESTING W REFLEX): HIV 1&2 Ab, 4th Generation: NONREACTIVE

## 2023-08-29 LAB — HEPATITIS C RNA QUANTITATIVE
HCV Quantitative Log: 6.06 log IU/mL — ABNORMAL HIGH
HCV RNA, PCR, QN: 1140000 IU/mL — ABNORMAL HIGH

## 2023-09-20 ENCOUNTER — Ambulatory Visit: Payer: 59 | Admitting: Internal Medicine

## 2023-09-20 ENCOUNTER — Encounter: Payer: Self-pay | Admitting: Internal Medicine

## 2023-09-20 VITALS — BP 124/80 | HR 93 | Temp 98.6°F | Ht 66.5 in | Wt 261.0 lb

## 2023-09-20 DIAGNOSIS — E118 Type 2 diabetes mellitus with unspecified complications: Secondary | ICD-10-CM

## 2023-09-20 DIAGNOSIS — B182 Chronic viral hepatitis C: Secondary | ICD-10-CM

## 2023-09-20 DIAGNOSIS — F1910 Other psychoactive substance abuse, uncomplicated: Secondary | ICD-10-CM | POA: Diagnosis not present

## 2023-09-20 DIAGNOSIS — Z7985 Long-term (current) use of injectable non-insulin antidiabetic drugs: Secondary | ICD-10-CM

## 2023-09-20 DIAGNOSIS — F902 Attention-deficit hyperactivity disorder, combined type: Secondary | ICD-10-CM

## 2023-09-20 DIAGNOSIS — Z7984 Long term (current) use of oral hypoglycemic drugs: Secondary | ICD-10-CM

## 2023-09-20 DIAGNOSIS — F1190 Opioid use, unspecified, uncomplicated: Secondary | ICD-10-CM | POA: Diagnosis not present

## 2023-09-20 MED ORDER — HYDRALAZINE HCL 50 MG PO TABS: 50.0000 mg | ORAL_TABLET | Freq: Three times a day (TID) | ORAL | 3 refills | Status: DC

## 2023-09-20 MED ORDER — METFORMIN HCL 500 MG PO TABS: 500.0000 mg | ORAL_TABLET | Freq: Every day | ORAL | 3 refills | Status: DC

## 2023-09-20 MED ORDER — OZEMPIC (0.25 OR 0.5 MG/DOSE) 2 MG/3ML ~~LOC~~ SOPN
PEN_INJECTOR | SUBCUTANEOUS | 1 refills | Status: DC
Start: 2023-09-20 — End: 2023-11-22

## 2023-09-20 MED ORDER — SENNOSIDES-DOCUSATE SODIUM 8.6-50 MG PO TABS
1.0000 | ORAL_TABLET | Freq: Two times a day (BID) | ORAL | 3 refills | Status: AC | PRN
Start: 2023-09-20 — End: ?

## 2023-09-20 MED ORDER — CENTRUM ADULTS PO TABS: ORAL_TABLET | ORAL | 3 refills | Status: AC

## 2023-09-20 MED ORDER — VITAMIN D3 50 MCG (2000 UT) PO CAPS: 2000.0000 [IU] | ORAL_CAPSULE | Freq: Every day | ORAL | 3 refills | Status: AC

## 2023-09-20 MED ORDER — AMLODIPINE BESYLATE 5 MG PO TABS: 5.0000 mg | ORAL_TABLET | Freq: Every day | ORAL | 3 refills | Status: DC

## 2023-09-20 NOTE — Progress Notes (Signed)
Subjective:  Patient ID: [name], female    DOB: [birth year]  Age: 42 y.o. MRN: [id number]  CC: Follow-up (2 mnth f/u)   HPI [name] presents for obesity - eating better, exercising - lost wt. Clean x 90 d F/u on HTN, DM, bipolar disorder  Outpatient Medications Prior to Visit  Medication Sig Dispense Refill   cloNIDine (CATAPRES) 0.1 MG tablet Take 1 tablet (0.1 mg total) by mouth 2 (two) times daily. 180 tablet 1   lamoTRIgine (LAMICTAL) 150 MG tablet Take 150 mg by mouth daily.     meloxicam (MOBIC) 15 MG tablet Take 15 mg by mouth daily.     methadone (DOLOPHINE) 10 MG tablet Take 10 tablets (100 mg total) by mouth daily.     VYVANSE 60 MG capsule Take 60 mg by mouth daily.     metFORMIN (GLUCOPHAGE) 500 MG tablet Take 1 tablet (500 mg total) by mouth daily with breakfast. 90 tablet 0   Vitamin D, Ergocalciferol, (DRISDOL) 1.25 MG (50000 UNIT) CAPS capsule Take 1 capsule (50,000 Units total) by mouth every 7 (seven) days. 8 capsule 0   gabapentin (NEURONTIN) 300 MG capsule Take 1 capsule (300 mg total) by mouth 3 (three) times daily. 90 capsule 0   Oxcarbazepine (TRILEPTAL) 300 MG tablet Take 1 tablet (300 mg total) by mouth 2 (two) times daily. 60 tablet 0   traZODone (DESYREL) 100 MG tablet Take 1 tablet (100 mg total) by mouth at bedtime. 30 tablet 0   amLODipine (NORVASC) 5 MG tablet Take 1 tablet (5 mg total) by mouth daily. 90 tablet 0   hydrALAZINE (APRESOLINE) 50 MG tablet Take 1 tablet (50 mg total) by mouth 3 (three) times daily. 90 tablet 0   lamoTRIgine (LAMICTAL) 25 MG tablet Take 1 tablet (25 mg total) by mouth daily. 30 tablet 0   VYVANSE 40 MG capsule      No facility-administered medications prior to visit.    ROS: Review of Systems  Constitutional:  Negative for activity change, appetite change, chills, fatigue and unexpected weight change.  HENT:  Negative for congestion, mouth sores and sinus pressure.   Eyes:  Negative for visual  disturbance.  Respiratory:  Negative for cough and chest tightness.   Gastrointestinal:  Positive for constipation. Negative for abdominal pain and nausea.  Genitourinary:  Negative for difficulty urinating, frequency and vaginal pain.  Musculoskeletal:  Positive for arthralgias. Negative for back pain and gait problem.  Skin:  Negative for pallor and rash.  Neurological:  Negative for dizziness, tremors, weakness, numbness and headaches.  Psychiatric/Behavioral:  Positive for dysphoric mood. Negative for confusion, sleep disturbance and suicidal ideas. The patient is not nervous/anxious.     Objective:  BP 124/80 (BP Location: Left Arm, Patient Position: Sitting, Cuff Size: Normal)   Pulse 93   Temp 98.6 F (37 C) (Oral)   Ht 5' 6.5" (1.689 m)   Wt 261 lb (118.4 kg)   SpO2 98%   BMI 41.50 kg/m   BP Readings from Last 3 Encounters:  09/20/23 124/80  07/19/23 130/82  07/01/23 132/78    Wt Readings from Last 3 Encounters:  09/20/23 261 lb (118.4 kg)  07/19/23 275 lb (124.7 kg)  09/26/21 230 lb (104.3 kg)    Physical Exam Constitutional:      General: She is not in acute distress.    Appearance: She is well-developed. She is obese.  HENT:     Head: Normocephalic.  Right Ear: External ear normal.     Left Ear: External ear normal.     Nose: Nose normal.  Eyes:     General:        Right eye: No discharge.        Left eye: No discharge.     Conjunctiva/sclera: Conjunctivae normal.     Pupils: Pupils are equal, round, and reactive to light.  Neck:     Thyroid: No thyromegaly.     Vascular: No JVD.     Trachea: No tracheal deviation.  Cardiovascular:     Rate and Rhythm: Normal rate and regular rhythm.     Heart sounds: Normal heart sounds.  Pulmonary:     Effort: No respiratory distress.     Breath sounds: No stridor. No wheezing.  Abdominal:     General: Bowel sounds are normal. There is no distension.     Palpations: Abdomen is soft. There is no mass.      Tenderness: There is no abdominal tenderness. There is no guarding or rebound.  Musculoskeletal:        General: No tenderness.     Cervical back: Normal range of motion and neck supple. No rigidity.  Lymphadenopathy:     Cervical: No cervical adenopathy.  Skin:    Findings: No erythema or rash.  Neurological:     Cranial Nerves: No cranial nerve deficit.     Motor: No abnormal muscle tone.     Coordination: Coordination normal.     Gait: Gait normal.     Deep Tendon Reflexes: Reflexes normal.  Psychiatric:        Behavior: Behavior normal.        Thought Content: Thought content normal.        Judgment: Judgment normal.      A total time of 45 minutes was spent preparing to see the patient, reviewing tests, x-rays, operative reports and other medical records.  Also, obtaining history and performing comprehensive physical exam.  Additionally, counseling the patient regarding the above listed issues - Hep C, DM, HTN.   Finally, documenting clinical information in the health records, coordination of care, educating the patient.    Lab Results  Component Value Date   WBC 8.0 08/26/2023   HGB 13.3 08/26/2023   HCT 40.8 08/26/2023   PLT 431.0 (H) 08/26/2023   GLUCOSE 133 (H) 08/26/2023   CHOL 146 08/26/2023   TRIG 97.0 08/26/2023   HDL 53.60 08/26/2023   LDLCALC 73 08/26/2023   ALT 38 (H) 08/26/2023   AST 32 08/26/2023   NA 137 08/26/2023   K 4.2 08/26/2023   CL 101 08/26/2023   CREATININE 0.73 08/26/2023   BUN 23 08/26/2023   CO2 27 08/26/2023   TSH 2.22 08/26/2023   HGBA1C 6.5 08/26/2023    No results found.  Assessment & Plan:   Problem List Items Addressed This Visit     Polysubstance abuse (HCC)    Clean x 90 d      ADHD (attention deficit hyperactivity disorder)    On Vyvanse      Opioid use disorder (Chronic)    Clean x 90 d On methadone      Hepatitis C    Ref to  [facility]. [address].  [place], [place] [address]. [phone].  Hepatitis C mRNA quantitative findings discussed      Relevant Orders   Ambulatory referral to Gastroenterology   Diabetes mellitus  type 2, controlled (HCC) - Primary    Start Ozempic - discussed pros and cons      Relevant Medications   metFORMIN (GLUCOPHAGE) 500 MG tablet   Semaglutide,0.25 or 0.5MG /DOS, (OZEMPIC, 0.25 OR 0.5 MG/DOSE,) 2 MG/3ML SOPN      Meds ordered this encounter  Medications   amLODipine (NORVASC) 5 MG tablet    Sig: Take 1 tablet (5 mg total) by mouth daily.    Dispense:  90 tablet    Refill:  3   hydrALAZINE (APRESOLINE) 50 MG tablet    Sig: Take 1 tablet (50 mg total) by mouth 3 (three) times daily.    Dispense:  90 tablet    Refill:  3   metFORMIN (GLUCOPHAGE) 500 MG tablet    Sig: Take 1 tablet (500 mg total) by mouth daily with breakfast.    Dispense:  90 tablet    Refill:  3   Cholecalciferol (VITAMIN D3) 50 MCG (2000 UT) capsule    Sig: Take 1 capsule (2,000 Units total) by mouth daily.    Dispense:  100 capsule    Refill:  3   Multiple Vitamins-Minerals (CENTRUM ADULTS) TABS    Sig: 1 po qd    Dispense:  100 tablet    Refill:  3   senna-docusate (SENOKOT-S) 8.6-50 MG tablet    Sig: Take 1 tablet by mouth 2 (two) times daily as needed for moderate constipation.    Dispense:  100 tablet    Refill:  3   Semaglutide,0.25 or 0.5MG /DOS, (OZEMPIC, 0.25 OR 0.5 MG/DOSE,) 2 MG/3ML SOPN    Sig: Use 0.25 mg weekly sq for 1 month, then 0.5 mg sq weekly    Dispense:  9 mL    Refill:  1      Follow-up: Return in about 2 months (around 11/20/2023) for a follow-up visit.  Sonda Primes, MD

## 2023-09-20 NOTE — Assessment & Plan Note (Signed)
Start Ozempic - discussed pros and cons

## 2023-09-20 NOTE — Assessment & Plan Note (Addendum)
Ref to  [facility] [place]. [address]. [place], [place] [address]. [phone].  Hepatitis C mRNA quantitative findings discussed

## 2023-09-20 NOTE — Assessment & Plan Note (Signed)
Clean x 90 d

## 2023-09-20 NOTE — Assessment & Plan Note (Signed)
On Vyvanse.

## 2023-09-20 NOTE — Assessment & Plan Note (Signed)
Clean x 90 d On methadone

## 2023-10-19 LAB — HM DIABETES EYE EXAM

## 2023-11-02 ENCOUNTER — Ambulatory Visit (INDEPENDENT_AMBULATORY_CARE_PROVIDER_SITE_OTHER): Payer: 59

## 2023-11-02 VITALS — Ht 66.5 in | Wt 249.8 lb

## 2023-11-02 DIAGNOSIS — E118 Type 2 diabetes mellitus with unspecified complications: Secondary | ICD-10-CM

## 2023-11-02 DIAGNOSIS — Z0000 Encounter for general adult medical examination without abnormal findings: Secondary | ICD-10-CM

## 2023-11-02 DIAGNOSIS — Z1231 Encounter for screening mammogram for malignant neoplasm of breast: Secondary | ICD-10-CM

## 2023-11-02 NOTE — Patient Instructions (Addendum)
Ms. Kuc , Thank you for taking time to come for your Medicare Wellness Visit. I appreciate your ongoing commitment to your health goals. Please review the following plan we discussed and let me know if I can assist you in the future.   Referrals/Orders/Follow-Ups/Clinician Recommendations: You due for a Tetanus and you can get that at your local pharmacy.     You have an order for:   [x]   3D Mammogram     Please call for appointment:  [facility] [address] [place], [place] [address] [phone]   Make sure to wear two-piece clothing.  No lotions, powders, or deodorants the day of the appointment. Make sure to bring picture ID and insurance card.  Bring list of medications you are currently taking including any supplements.   Schedule your [place] screening mammogram through MyChart!   Log into your MyChart account.  Go to 'Visit' (or 'Appointments' if on mobile App) --> Schedule an Appointment  Under 'Select a Reason for Visit' choose the Mammogram Screening option.  Complete the pre-visit questions and select the time and place that best fits your schedule.    This is a list of the screening recommended for you and due dates:  Health Maintenance  Topic Date Due   Complete foot exam   Never done   Eye exam for diabetics  Never done   Yearly kidney health urinalysis for diabetes  Never done   DTaP/Tdap/Td vaccine (1 - Tdap) Never done   Pap with HPV screening  04/20/2014   COVID-19 Vaccine (1 - 2023-24 season) Never done   Flu Shot  03/06/2024*   Hemoglobin A1C  02/23/2024   Yearly kidney function blood test for diabetes  08/25/2024   Medicare Annual Wellness Visit  11/01/2024   Hepatitis C Screening  Completed   HIV Screening  Completed   HPV Vaccine  Aged Out  *Topic was postponed. The date shown is not the original due date.    Advanced directives: (In Chart) A copy of your advanced directives are scanned into your chart  should your provider ever need it.  Next Medicare Annual Wellness Visit scheduled for next year: Yes

## 2023-11-02 NOTE — Progress Notes (Addendum)
Subjective:   [name] is a 42 y.o. female who presents for an Initial Medicare Annual Wellness Visit.  Visit Complete: In person  Cardiac Risk Factors include: advanced age (>43men, >20 women);diabetes mellitus;Other (see comment), Risk factor comments: ADHD, PTSD     Objective:    Today's Vitals   11/02/23 1123 11/02/23 1130  Weight:  249 lb 12.8 oz (113.3 kg)  Height:  5' 6.5" (1.689 m)  PainSc: 3     Body mass index is 39.71 kg/m.     11/02/2023   11:24 AM 07/01/2023    2:04 PM 06/29/2023   11:48 PM 01/29/2021    8:39 PM 10/03/2020    6:18 AM 07/27/2020   10:45 AM 06/24/2020    9:09 AM  Advanced Directives  Does Patient Have a Medical Advance Directive? Yes  No No No No No  Type of Estate agent of [place];Living will        Does patient want to make changes to medical advance directive? No - Patient declined        Copy of Healthcare Power of Attorney in Chart? Yes - validated most recent copy scanned in chart (See row information)        Would patient like information on creating a medical advance directive?      No - Patient declined No - Patient declined     Information is confidential and restricted. Go to Review Flowsheets to unlock data.    Current Medications (verified) Outpatient Encounter Medications as of 11/02/2023  Medication Sig   Cholecalciferol (VITAMIN D3) 50 MCG (2000 UT) capsule Take 1 capsule (2,000 Units total) by mouth daily.   cloNIDine (CATAPRES) 0.1 MG tablet Take 1 tablet (0.1 mg total) by mouth 2 (two) times daily.   lamoTRIgine (LAMICTAL) 150 MG tablet Take 150 mg by mouth daily.   meloxicam (MOBIC) 15 MG tablet Take 15 mg by mouth daily.   metFORMIN (GLUCOPHAGE) 500 MG tablet Take 1 tablet (500 mg total) by mouth daily with breakfast.   methadone (DOLOPHINE) 10 MG tablet Take 10 tablets (100 mg total) by mouth daily.   Multiple Vitamins-Minerals (CENTRUM ADULTS) TABS 1 po qd   Semaglutide,0.25 or 0.5MG /DOS,  (OZEMPIC, 0.25 OR 0.5 MG/DOSE,) 2 MG/3ML SOPN Use 0.25 mg weekly sq for 1 month, then 0.5 mg sq weekly   senna-docusate (SENOKOT-S) 8.6-50 MG tablet Take 1 tablet by mouth 2 (two) times daily as needed for moderate constipation.   VYVANSE 60 MG capsule Take 60 mg by mouth daily.   amLODipine (NORVASC) 5 MG tablet Take 1 tablet (5 mg total) by mouth daily.   gabapentin (NEURONTIN) 300 MG capsule Take 1 capsule (300 mg total) by mouth 3 (three) times daily.   hydrALAZINE (APRESOLINE) 50 MG tablet Take 1 tablet (50 mg total) by mouth 3 (three) times daily.   Oxcarbazepine (TRILEPTAL) 300 MG tablet Take 1 tablet (300 mg total) by mouth 2 (two) times daily.   traZODone (DESYREL) 100 MG tablet Take 1 tablet (100 mg total) by mouth at bedtime.   No facility-administered encounter medications on file as of 11/02/2023.    Allergies (verified) Shellfish allergy and Meat extract   History: Past Medical History:  Diagnosis Date   ADHD (attention deficit hyperactivity disorder)    Alcohol abuse    Anxiety    Bipolar 1 disorder (HCC)    Borderline systolic HTN    Depression    Diabetes mellitus type 2, controlled (HCC) 07/19/2023  2024  Start Metformin  The patient was referred to diabetic teaching     GERD (gastroesophageal reflux disease)    Heroin abuse (HCC)    Hypertension    Kidney stone    Knee pain, bilateral    - patellofemoral syndrome, followed by Dr. [name] at [facility]   Migraines    Migraines    Polysubstance abuse (HCC)    - Hx ETOH, cocaine, THC, IV heroin, narcotics   Psychiatric care    Previous [facility] H&P notes PMHx Schizophrenia, though patient denies this.   Seizures (HCC)    Past Surgical History:  Procedure Laterality Date   KNEE SURGERY     SHOULDER SURGERY     Family History  Problem Relation Age of Onset   Hypertension Father    Social History   Socioeconomic History   Marital status: Single    Spouse name: Not on file   Number of children: Not on file    Years of education: Not on file   Highest education level: Not on file  Occupational History   Occupation: Disabled  Tobacco Use   Smoking status: Former    Current packs/day: 0.50    Types: Cigarettes   Smokeless tobacco: Never  Vaping Use   Vaping status: Never Used  Substance and Sexual Activity   Alcohol use: No   Drug use: No    Types: IV    Comment: methadone    Sexual activity: Not on file  Other Topics Concern   Not on file  Social History Narrative   Lives with her dad.   Social Determinants of Health   Financial Resource Strain: High Risk (11/02/2023)   Overall Financial Resource Strain (CARDIA)    Difficulty of Paying Living Expenses: Very hard  Food Insecurity: No Food Insecurity (11/02/2023)   Hunger Vital Sign    Worried About Running Out of Food in the Last Year: Never true    Ran Out of Food in the Last Year: Never true  Transportation Needs: No Transportation Needs (11/02/2023)   PRAPARE - Administrator, Civil Service (Medical): No    Lack of Transportation (Non-Medical): No  Physical Activity: Sufficiently Active (11/02/2023)   Exercise Vital Sign    Days of Exercise per Week: 7 days    Minutes of Exercise per Session: 30 min  Stress: Stress Concern Present (11/02/2023)   [employer] of Occupational Health - Occupational Stress Questionnaire    Feeling of Stress : To some extent  Social Connections: Moderately Isolated (11/02/2023)   Social Connection and Isolation Panel [NHANES]    Frequency of Communication with Friends and Family: Once a week    Frequency of Social Gatherings with Friends and Family: More than three times a week    Attends Religious Services: More than 4 times per year    Active Member of [employer] or Organizations: No    Attends Engineer, structural: Never    Marital Status: Never married    Tobacco Counseling Counseling given: Not Answered   Clinical Intake:  Pre-visit preparation completed:  Yes  Pain : 0-10 Pain Score: 3  Pain Type: Acute pain Pain Location: Knee Pain Orientation: Right Pain Descriptors / Indicators: Aching, Discomfort Pain Onset: More than a month ago Pain Frequency: Constant Pain Relieving Factors: Meloxicam , Gabapentin  Pain Relieving Factors: Meloxicam , Gabapentin  BMI - recorded: 39.71 Nutritional Status: BMI > 30  Obese Nutritional Risks: None Diabetes: Yes CBG done?: No Did pt.  bring in CBG monitor from home?: No  How often do you need to have someone help you when you read instructions, pamphlets, or other written materials from your doctor or pharmacy?: 1 - Never  Interpreter Needed?: No  Information entered by :: Abdul Beirne, RMA   Activities of Daily Living    11/02/2023   11:33 AM 07/01/2023    2:04 PM  In your present state of health, do you have any difficulty performing the following activities:  Hearing? 0   Vision? 0   Difficulty concentrating or making decisions? 0   Walking or climbing stairs? 0   Dressing or bathing? 0   Doing errands, shopping? 0   Preparing Food and eating ? N   Using the Toilet? N   In the past six months, have you accidently leaked urine? N   Do you have problems with loss of bowel control? N   Managing your Medications? N   Managing your Finances? N   Housekeeping or managing your Housekeeping? N      Information is confidential and restricted. Go to Review Flowsheets to unlock data.    Patient Care Team: Plotnikov, Georgina Quint, MD as PCP - General (Internal Medicine) [facility]  Indicate any recent Medical Services you may have received from other than [facility] providers in the past year (date may be approximate).     Assessment:   This is a routine wellness examination for [name].  Hearing/Vision screen Hearing Screening - Comments:: Denies hearing difficulties   Vision Screening - Comments:: Wears eyeglasses   Goals Addressed               This Visit's  Progress     Patient Stated (pt-stated)        Would like to lose weight and take of her Hep C.      Depression Screen    11/02/2023   11:41 AM 09/20/2023    2:56 PM 09/20/2023    2:43 PM 09/26/2021    6:21 AM 01/13/2019    1:34 PM  PHQ 2/9 Scores  PHQ - 2 Score 0 0 0 0   PHQ- 9 Score 1 3  0      Information is confidential and restricted. Go to Review Flowsheets to unlock data.    Fall Risk    11/02/2023   11:37 AM 09/20/2023    2:43 PM 01/13/2019    1:34 PM  Fall Risk   Falls in the past year? 0 0   Number falls in past yr: 0 0   Injury with Fall? 0 0   Risk for fall due to : No Fall Risks No Fall Risks   Follow up Falls prevention discussed;Falls evaluation completed Falls evaluation completed      Information is confidential and restricted. Go to Review Flowsheets to unlock data.    MEDICARE RISK AT HOME: Medicare Risk at Home Any stairs in or around the home?: Yes If so, are there any without handrails?: Yes Home free of loose throw rugs in walkways, pet beds, electrical cords, etc?: Yes Adequate lighting in your home to reduce risk of falls?: Yes Life alert?: Yes Use of a cane, walker or w/c?: No Grab bars in the bathroom?: Yes Shower chair or bench in shower?: No Elevated toilet seat or a handicapped toilet?: No  TIMED UP AND GO:  Was the test performed? Yes  Length of time to ambulate 10 feet: 15 sec Gait steady and fast without  use of assistive device    Cognitive Function:        11/02/2023   11:38 AM  6CIT Screen  What Year? 0 points  What month? 0 points  What time? 0 points  Count back from 20 0 points  Months in reverse 0 points  Repeat phrase 0 points  Total Score 0 points    Immunizations Immunization History  Administered Date(s) Administered   Influenza,inj,Quad PF,6+ Mos 08/27/2013, 09/14/2018   Pfizer(Comirnaty)Fall Seasonal Vaccine 12 years and older 08/13/2023   Pneumococcal Polysaccharide-23 09/14/2018    TDAP status:  Due, Education has been provided regarding the importance of this vaccine. Advised may receive this vaccine at local pharmacy or Health Dept. Aware to provide a copy of the vaccination record if obtained from local pharmacy or Health Dept. Verbalized acceptance and understanding.  Flu Vaccine status: Up to date   Covid-19 vaccine status: Completed vaccines  Qualifies for Shingles Vaccine? No   Zostavax completed No   Shingrix Completed?: No.    Education has been provided regarding the importance of this vaccine. Patient has been advised to call insurance company to determine out of pocket expense if they have not yet received this vaccine. Advised may also receive vaccine at local pharmacy or Health Dept. Verbalized acceptance and understanding.  Screening Tests Health Maintenance  Topic Date Due   OPHTHALMOLOGY EXAM  Never done   Diabetic kidney evaluation - Urine ACR  Never done   Cervical Cancer Screening (HPV/Pap Cotest)  04/20/2014   FOOT EXAM  11/22/2023 (Originally 11/28/1990)   DTaP/Tdap/Td (1 - Tdap) 12/07/2023 (Originally 11/29/1999)   INFLUENZA VACCINE  03/06/2024 (Originally 07/08/2023)   HEMOGLOBIN A1C  02/23/2024   Diabetic kidney evaluation - eGFR measurement  08/25/2024   Medicare Annual Wellness (AWV)  11/01/2024   COVID-19 Vaccine  Completed   Hepatitis C Screening  Completed   HIV Screening  Completed   HPV VACCINES  Aged Out    Health Maintenance  Health Maintenance Due  Topic Date Due   OPHTHALMOLOGY EXAM  Never done   Diabetic kidney evaluation - Urine ACR  Never done   Cervical Cancer Screening (HPV/Pap Cotest)  04/20/2014    Mammogram status: Ordered 11/02/2023. Pt provided with contact info and advised to call to schedule appt.    Lung Cancer Screening: (Low Dose CT Chest recommended if Age 56-80 years, 20 pack-year currently smoking OR have quit w/in 15years.) does not qualify.   Lung Cancer Screening Referral: N/A  Additional  Screening:  Hepatitis C Screening: does qualify; Completed 09/20/2023  Vision Screening: Recommended annual ophthalmology exams for early detection of glaucoma and other disorders of the eye. Is the patient up to date with their annual eye exam?  Yes  Who is the provider or what is the name of the office in which the patient attends annual eye exams? Dr. Dione Booze If pt is not established with a provider, would they like to be referred to a provider to establish care? No .   Dental Screening: Recommended annual dental exams for proper oral hygiene  Diabetic Foot Exam: Diabetic Foot Exam: Overdue, Pt has been advised about the importance in completing this exam. Pt is scheduled for diabetic foot exam on 11/22/2023.  Community Resource Referral / Chronic Care Management: CRR required this visit?  No   CCM required this visit?  No     Plan:     I have personally reviewed and noted the following in the patient's chart:  Medical and social history Use of alcohol, tobacco or illicit drugs  Current medications and supplements including opioid prescriptions. Patient is currently taking opioid prescriptions. Information provided to patient regarding non-opioid alternatives. Patient advised to discuss non-opioid treatment plan with their provider. Functional ability and status Nutritional status Physical activity Advanced directives List of other physicians Hospitalizations, surgeries, and ER visits in previous 12 months Vitals Screenings to include cognitive, depression, and falls Referrals and appointments  In addition, I have reviewed and discussed with patient certain preventive protocols, quality metrics, and best practice recommendations. A written personalized care plan for preventive services as well as general preventive health recommendations were provided to patient.     [name], CMA   11/02/2023   After Visit Summary: (MyChart) Due to this being a telephonic visit,  the after visit summary with patients personalized plan was offered to patient via MyChart   Nurse Notes: Patient is due for a Tdap vaccine.  She is due for a mammogram screening.  Patient is also due for a foot exam, which she has an appointment coming up in February with PCP.  I have placed an order for  a UACR, as she will get done during her up coming visit.  Patient stated she has recently seen Dr. Dione Booze.  I have requested records from his office.  She had no other concerns to address today.      Medical screening examination/treatment/procedure(s) were performed by non-physician practitioner and as supervising physician I was immediately available for consultation/collaboration.  I agree with above. Jacinta Shoe, MD

## 2023-11-16 ENCOUNTER — Ambulatory Visit
Admission: RE | Admit: 2023-11-16 | Discharge: 2023-11-16 | Disposition: A | Discharge status: Home / Self Care | Payer: 59 | Source: Ambulatory Visit | Attending: Internal Medicine | Admitting: Internal Medicine

## 2023-11-16 DIAGNOSIS — Z0000 Encounter for general adult medical examination without abnormal findings: Secondary | ICD-10-CM

## 2023-11-16 DIAGNOSIS — Z1231 Encounter for screening mammogram for malignant neoplasm of breast: Secondary | ICD-10-CM

## 2023-11-19 ENCOUNTER — Other Ambulatory Visit: Payer: Self-pay | Admitting: Internal Medicine

## 2023-11-19 DIAGNOSIS — R928 Other abnormal and inconclusive findings on diagnostic imaging of breast: Secondary | ICD-10-CM

## 2023-11-22 ENCOUNTER — Encounter: Payer: Self-pay | Admitting: Internal Medicine

## 2023-11-22 ENCOUNTER — Ambulatory Visit: Payer: 59 | Admitting: Internal Medicine

## 2023-11-22 VITALS — BP 130/84 | HR 98 | Temp 98.3°F | Ht 66.5 in | Wt 243.0 lb

## 2023-11-22 DIAGNOSIS — E118 Type 2 diabetes mellitus with unspecified complications: Secondary | ICD-10-CM | POA: Diagnosis not present

## 2023-11-22 DIAGNOSIS — F1190 Opioid use, unspecified, uncomplicated: Secondary | ICD-10-CM | POA: Diagnosis not present

## 2023-11-22 DIAGNOSIS — F1910 Other psychoactive substance abuse, uncomplicated: Secondary | ICD-10-CM

## 2023-11-22 DIAGNOSIS — M25561 Pain in right knee: Secondary | ICD-10-CM

## 2023-11-22 DIAGNOSIS — Z7985 Long-term (current) use of injectable non-insulin antidiabetic drugs: Secondary | ICD-10-CM

## 2023-11-22 DIAGNOSIS — E559 Vitamin D deficiency, unspecified: Secondary | ICD-10-CM

## 2023-11-22 DIAGNOSIS — M25562 Pain in left knee: Secondary | ICD-10-CM

## 2023-11-22 DIAGNOSIS — F341 Dysthymic disorder: Secondary | ICD-10-CM

## 2023-11-22 DIAGNOSIS — G40909 Epilepsy, unspecified, not intractable, without status epilepticus: Secondary | ICD-10-CM

## 2023-11-22 MED ORDER — MELOXICAM 15 MG PO TABS: 15.0000 mg | ORAL_TABLET | Freq: Every day | ORAL | 3 refills | Status: DC | PRN

## 2023-11-22 MED ORDER — SEMAGLUTIDE (1 MG/DOSE) 4 MG/3ML ~~LOC~~ SOPN: 1.0000 mg | PEN_INJECTOR | SUBCUTANEOUS | 3 refills | Status: DC

## 2023-11-22 NOTE — Progress Notes (Signed)
Subjective:  Patient ID: [name], female    DOB: [birth year]  Age: 42 y.o. MRN: [id number]  CC: Medical Management of Chronic Issues (2 MNTH F/U)   HPI [name] presents for Vit D def, bipolar depression, seizure disorder  Outpatient Medications Prior to Visit  Medication Sig Dispense Refill   Cholecalciferol (VITAMIN D3) 50 MCG (2000 UT) capsule Take 1 capsule (2,000 Units total) by mouth daily. 100 capsule 3   cloNIDine (CATAPRES) 0.1 MG tablet Take 1 tablet (0.1 mg total) by mouth 2 (two) times daily. 180 tablet 1   lamoTRIgine (LAMICTAL) 150 MG tablet Take 150 mg by mouth daily.     metFORMIN (GLUCOPHAGE) 500 MG tablet Take 1 tablet (500 mg total) by mouth daily with breakfast. 90 tablet 3   methadone (DOLOPHINE) 10 MG tablet Take 10 tablets (100 mg total) by mouth daily.     Multiple Vitamins-Minerals (CENTRUM ADULTS) TABS 1 po qd 100 tablet 3   senna-docusate (SENOKOT-S) 8.6-50 MG tablet Take 1 tablet by mouth 2 (two) times daily as needed for moderate constipation. 100 tablet 3   VYVANSE 60 MG capsule Take 60 mg by mouth daily.     meloxicam (MOBIC) 15 MG tablet Take 15 mg by mouth daily.     Semaglutide,0.25 or 0.5MG /DOS, (OZEMPIC, 0.25 OR 0.5 MG/DOSE,) 2 MG/3ML SOPN Use 0.25 mg weekly sq for 1 month, then 0.5 mg sq weekly 9 mL 1   amLODipine (NORVASC) 5 MG tablet Take 1 tablet (5 mg total) by mouth daily. 90 tablet 3   gabapentin (NEURONTIN) 300 MG capsule Take 1 capsule (300 mg total) by mouth 3 (three) times daily. 90 capsule 0   hydrALAZINE (APRESOLINE) 50 MG tablet Take 1 tablet (50 mg total) by mouth 3 (three) times daily. 90 tablet 3   Oxcarbazepine (TRILEPTAL) 300 MG tablet Take 1 tablet (300 mg total) by mouth 2 (two) times daily. 60 tablet 0   traZODone (DESYREL) 100 MG tablet Take 1 tablet (100 mg total) by mouth at bedtime. 30 tablet 0   No facility-administered medications prior to visit.    ROS: Review of Systems  Constitutional:  Positive for  fatigue. Negative for activity change, appetite change, chills and unexpected weight change.  HENT:  Negative for congestion, mouth sores and sinus pressure.   Eyes:  Negative for visual disturbance.  Respiratory:  Negative for cough and chest tightness.   Gastrointestinal:  Negative for abdominal pain and nausea.  Genitourinary:  Negative for difficulty urinating, frequency and vaginal pain.  Musculoskeletal:  Positive for back pain. Negative for gait problem.  Skin:  Negative for pallor and rash.  Neurological:  Negative for dizziness, tremors, weakness, numbness and headaches.  Psychiatric/Behavioral:  Positive for decreased concentration. Negative for confusion, sleep disturbance and suicidal ideas. The patient is nervous/anxious.     Objective:  BP 130/84 (BP Location: Left Arm, Patient Position: Sitting, Cuff Size: Normal)   Pulse 98   Temp 98.3 F (36.8 C) (Oral)   Ht 5' 6.5" (1.689 m)   Wt 243 lb (110.2 kg)   SpO2 96%   BMI 38.63 kg/m   BP Readings from Last 3 Encounters:  11/22/23 130/84  09/20/23 124/80  07/19/23 130/82    Wt Readings from Last 3 Encounters:  11/22/23 243 lb (110.2 kg)  11/02/23 249 lb 12.8 oz (113.3 kg)  09/20/23 261 lb (118.4 kg)    Physical Exam Constitutional:      General: She is not in  acute distress.    Appearance: She is well-developed.  HENT:     Head: Normocephalic.     Right Ear: External ear normal.     Left Ear: External ear normal.     Nose: Nose normal.  Eyes:     General:        Right eye: No discharge.        Left eye: No discharge.     Conjunctiva/sclera: Conjunctivae normal.     Pupils: Pupils are equal, round, and reactive to light.  Neck:     Thyroid: No thyromegaly.     Vascular: No JVD.     Trachea: No tracheal deviation.  Cardiovascular:     Rate and Rhythm: Normal rate and regular rhythm.     Heart sounds: Normal heart sounds.  Pulmonary:     Effort: No respiratory distress.     Breath sounds: No stridor.  No wheezing.  Abdominal:     General: Bowel sounds are normal. There is no distension.     Palpations: Abdomen is soft. There is no mass.     Tenderness: There is no abdominal tenderness. There is no guarding or rebound.  Musculoskeletal:        General: No tenderness.     Cervical back: Normal range of motion and neck supple. No rigidity.  Lymphadenopathy:     Cervical: No cervical adenopathy.  Skin:    Findings: No erythema or rash.  Neurological:     Mental Status: She is oriented to person, place, and time. Mental status is at baseline.     Cranial Nerves: No cranial nerve deficit.     Motor: No abnormal muscle tone.     Coordination: Coordination normal.     Deep Tendon Reflexes: Reflexes normal.  Psychiatric:        Behavior: Behavior normal.        Thought Content: Thought content normal.     Lab Results  Component Value Date   WBC 8.0 08/26/2023   HGB 13.3 08/26/2023   HCT 40.8 08/26/2023   PLT 431.0 (H) 08/26/2023   GLUCOSE 133 (H) 08/26/2023   CHOL 146 08/26/2023   TRIG 97.0 08/26/2023   HDL 53.60 08/26/2023   LDLCALC 73 08/26/2023   ALT 38 (H) 08/26/2023   AST 32 08/26/2023   NA 137 08/26/2023   K 4.2 08/26/2023   CL 101 08/26/2023   CREATININE 0.73 08/26/2023   BUN 23 08/26/2023   CO2 27 08/26/2023   TSH 2.22 08/26/2023   HGBA1C 6.5 08/26/2023    MM 3D SCREENING MAMMOGRAM BILATERAL BREAST Result Date: 11/18/2023 CLINICAL DATA:  Screening. EXAM: DIGITAL SCREENING BILATERAL MAMMOGRAM WITH TOMOSYNTHESIS AND CAD TECHNIQUE: Bilateral screening digital craniocaudal and mediolateral oblique mammograms were obtained. Bilateral screening digital breast tomosynthesis was performed. The images were evaluated with computer-aided detection. COMPARISON:  None.  Baseline study. ACR Breast Density Category b: There are scattered areas of fibroglandular density. FINDINGS: In the left breast, possible masses warrant further evaluation. In the right breast, no findings  suspicious for malignancy. IMPRESSION: Further evaluation is suggested for possible masses in the left breast. RECOMMENDATION: Diagnostic mammogram and possibly ultrasound of the left breast. (Code:[id number]-[id number]) The patient will be contacted regarding the findings, and additional imaging will be scheduled. BI-RADS CATEGORY  0: Incomplete: Need additional imaging evaluation. Electronically Signed   By: Amie Portland M.D.   On: 11/18/2023 13:57    Assessment & Plan:   Problem List Items Addressed This Visit  Opioid use disorder (Chronic)   Clean x 5 months On methadone      ANXIETY DEPRESSION   Follow-up with her psychiatrist-Dr. Derrill Kay She is on Vyvanse for ADD, Lamictal, hydroxyzine, Trileptal, Catapres      Polysubstance abuse (HCC)   Clean x 5 months On methadone      PATELLO-FEMORAL SYNDROME   On Meloxicam - was given [facility]  Potential benefits of a long term NSAID use as well as potential risks  and complications were explained to the patient and were aknowledged. Blue-Emu cream was recommended to use 2-3 times a day       Seizure disorder (HCC)   She is on Lamictal, gabapentin      Diabetes mellitus type 2, controlled (HCC) - Primary   Start Ozempic - discussed pros and cons      Relevant Medications   Semaglutide, 1 MG/DOSE, 4 MG/3ML SOPN   Other Relevant Orders   Hemoglobin A1c   Comprehensive metabolic panel   Vitamin D deficiency   On Vit D         Meds ordered this encounter  Medications   Semaglutide, 1 MG/DOSE, 4 MG/3ML SOPN    Sig: Inject 1 mg into the skin once a week.    Dispense:  3 mL    Refill:  3   meloxicam (MOBIC) 15 MG tablet    Sig: Take 1 tablet (15 mg total) by mouth daily as needed for pain.    Dispense:  30 tablet    Refill:  3      Follow-up: Return in about 3 months (around 02/20/2024) for a follow-up visit.  Sonda Primes, MD

## 2023-11-22 NOTE — Assessment & Plan Note (Signed)
Start Ozempic - discussed pros and cons

## 2023-11-22 NOTE — Assessment & Plan Note (Signed)
Follow-up with her psychiatrist-Dr. Derrill Kay She is on Vyvanse for ADD, Lamictal, hydroxyzine, Trileptal, Catapres

## 2023-11-22 NOTE — Assessment & Plan Note (Addendum)
On Meloxicam - was given [facility]  Potential benefits of a long term NSAID use as well as potential risks  and complications were explained to the patient and were aknowledged. Blue-Emu cream was recommended to use 2-3 times a day

## 2023-11-22 NOTE — Assessment & Plan Note (Signed)
She is on Lamictal, gabapentin

## 2023-11-22 NOTE — Assessment & Plan Note (Signed)
Clean x 5 months On methadone

## 2023-11-22 NOTE — Assessment & Plan Note (Signed)
On Vit D 

## 2023-12-09 ENCOUNTER — Other Ambulatory Visit: Payer: 59

## 2023-12-14 ENCOUNTER — Other Ambulatory Visit: Payer: 59

## 2024-01-10 ENCOUNTER — Other Ambulatory Visit: Payer: 59

## 2024-01-19 ENCOUNTER — Other Ambulatory Visit: Payer: Self-pay | Admitting: Nurse Practitioner

## 2024-01-19 DIAGNOSIS — B182 Chronic viral hepatitis C: Secondary | ICD-10-CM

## 2024-01-26 DIAGNOSIS — M6282 Rhabdomyolysis: Secondary | ICD-10-CM | POA: Insufficient documentation

## 2024-01-26 DIAGNOSIS — D72829 Elevated white blood cell count, unspecified: Secondary | ICD-10-CM | POA: Insufficient documentation

## 2024-01-26 DIAGNOSIS — R4182 Altered mental status, unspecified: Secondary | ICD-10-CM | POA: Insufficient documentation

## 2024-01-29 DIAGNOSIS — R000 Tachycardia, unspecified: Secondary | ICD-10-CM | POA: Insufficient documentation

## 2024-02-14 ENCOUNTER — Other Ambulatory Visit: Payer: Self-pay | Admitting: Internal Medicine

## 2024-02-14 ENCOUNTER — Other Ambulatory Visit (HOSPITAL_COMMUNITY): Payer: Self-pay

## 2024-02-14 MED ORDER — LISDEXAMFETAMINE DIMESYLATE 70 MG PO CAPS
70.0000 mg | ORAL_CAPSULE | Freq: Every day | ORAL | 0 refills | Status: DC
  Filled 2024-02-14: qty 30, 30d supply, fill #0

## 2024-02-21 ENCOUNTER — Ambulatory Visit (INDEPENDENT_AMBULATORY_CARE_PROVIDER_SITE_OTHER): Payer: 59 | Admitting: Internal Medicine

## 2024-02-21 ENCOUNTER — Encounter: Payer: Self-pay | Admitting: Internal Medicine

## 2024-02-21 VITALS — BP 130/84 | HR 85 | Temp 98.0°F | Ht 66.5 in | Wt 235.0 lb

## 2024-02-21 DIAGNOSIS — F4310 Post-traumatic stress disorder, unspecified: Secondary | ICD-10-CM | POA: Diagnosis not present

## 2024-02-21 DIAGNOSIS — E118 Type 2 diabetes mellitus with unspecified complications: Secondary | ICD-10-CM

## 2024-02-21 DIAGNOSIS — F1910 Other psychoactive substance abuse, uncomplicated: Secondary | ICD-10-CM

## 2024-02-21 DIAGNOSIS — G40909 Epilepsy, unspecified, not intractable, without status epilepticus: Secondary | ICD-10-CM

## 2024-02-21 DIAGNOSIS — Z7985 Long-term (current) use of injectable non-insulin antidiabetic drugs: Secondary | ICD-10-CM | POA: Diagnosis not present

## 2024-02-21 MED ORDER — SEMAGLUTIDE (2 MG/DOSE) 8 MG/3ML ~~LOC~~ SOPN: 2.0000 mg | PEN_INJECTOR | SUBCUTANEOUS | 3 refills | Status: DC

## 2024-02-21 MED ORDER — SUMATRIPTAN SUCCINATE 100 MG PO TABS: ORAL_TABLET | ORAL | 5 refills | Status: DC

## 2024-02-21 MED ORDER — METHADONE HCL 10 MG PO TABS: 90.0000 mg | ORAL_TABLET | Freq: Every day | ORAL | Status: DC

## 2024-02-21 NOTE — Assessment & Plan Note (Signed)
Doing fair 

## 2024-02-21 NOTE — Assessment & Plan Note (Signed)
 On methadone 90 mg/d - [facility]

## 2024-02-21 NOTE — Assessment & Plan Note (Signed)
 She is on Lamictal, gabapentin Neurology ref

## 2024-02-21 NOTE — Progress Notes (Signed)
 Subjective:  Patient ID: [name], female    DOB: Oct 29, 1981  Age: 43 y.o. MRN: [id number]  CC: No chief complaint on file.   HPI Veneda Kirksey [place] presents for h/o seizures - last 3 wks ago, bipolar disorder, DM  Outpatient Medications Prior to Visit  Medication Sig Dispense Refill   Cholecalciferol (VITAMIN D3) 50 MCG (2000 UT) capsule Take 1 capsule (2,000 Units total) by mouth daily. 100 capsule 3   cloNIDine (CATAPRES) 0.1 MG tablet TAKE 1 TABLET BY MOUTH 2 TIMES DAILY. 180 tablet 1   hydrOXYzine (ATARAX) 50 MG tablet Take 50 mg by mouth every 8 (eight) hours as needed.     lamoTRIgine (LAMICTAL) 150 MG tablet Take 150 mg by mouth daily.     lisdexamfetamine (VYVANSE) 70 MG capsule Take 1 capsule (70 mg total) by mouth daily. 30 capsule 0   meloxicam (MOBIC) 15 MG tablet Take 1 tablet (15 mg total) by mouth daily as needed for pain. 30 tablet 3   metFORMIN (GLUCOPHAGE) 500 MG tablet Take 1 tablet (500 mg total) by mouth daily with breakfast. 90 tablet 3   Multiple Vitamins-Minerals (CENTRUM ADULTS) TABS 1 po qd 100 tablet 3   senna-docusate (SENOKOT-S) 8.6-50 MG tablet Take 1 tablet by mouth 2 (two) times daily as needed for moderate constipation. 100 tablet 3   VYVANSE 60 MG capsule Take 60 mg by mouth daily.     methadone (DOLOPHINE) 10 MG tablet Take 10 tablets (100 mg total) by mouth daily.     Semaglutide, 1 MG/DOSE, 4 MG/3ML SOPN Inject 1 mg into the skin once a week. 3 mL 3   amLODipine (NORVASC) 5 MG tablet Take 1 tablet (5 mg total) by mouth daily. 90 tablet 3   gabapentin (NEURONTIN) 300 MG capsule Take 1 capsule (300 mg total) by mouth 3 (three) times daily. 90 capsule 0   hydrALAZINE (APRESOLINE) 50 MG tablet Take 1 tablet (50 mg total) by mouth 3 (three) times daily. 90 tablet 3   Oxcarbazepine (TRILEPTAL) 300 MG tablet Take 1 tablet (300 mg total) by mouth 2 (two) times daily. 60 tablet 0   traZODone (DESYREL) 100 MG tablet Take 1 tablet (100 mg total) by mouth  at bedtime. 30 tablet 0   No facility-administered medications prior to visit.    ROS: Review of Systems  Constitutional:  Negative for activity change, appetite change, chills, fatigue and unexpected weight change.  HENT:  Negative for congestion, mouth sores and sinus pressure.   Eyes:  Negative for visual disturbance.  Respiratory:  Negative for cough and chest tightness.   Gastrointestinal:  Negative for abdominal pain and nausea.  Genitourinary:  Negative for difficulty urinating, frequency and vaginal pain.  Musculoskeletal:  Negative for back pain and gait problem.  Skin:  Negative for pallor and rash.  Neurological:  Positive for seizures and headaches. Negative for dizziness, tremors, weakness and numbness.  Psychiatric/Behavioral:  Negative for confusion, sleep disturbance and suicidal ideas. The patient is nervous/anxious.     Objective:  BP 130/84   Pulse 85   Temp 98 F (36.7 C) (Oral)   Ht 5' 6.5" (1.689 m)   Wt 235 lb (106.6 kg)   SpO2 96%   BMI 37.36 kg/m   BP Readings from Last 3 Encounters:  02/21/24 130/84  11/22/23 130/84  09/20/23 124/80    Wt Readings from Last 3 Encounters:  02/21/24 235 lb (106.6 kg)  11/22/23 243 lb (110.2 kg)  11/02/23 249  lb 12.8 oz (113.3 kg)    Physical Exam Constitutional:      General: She is not in acute distress.    Appearance: She is well-developed. She is obese.  HENT:     Head: Normocephalic.     Right Ear: External ear normal.     Left Ear: External ear normal.     Nose: Nose normal.  Eyes:     General:        Right eye: No discharge.        Left eye: No discharge.     Conjunctiva/sclera: Conjunctivae normal.     Pupils: Pupils are equal, round, and reactive to light.  Neck:     Thyroid: No thyromegaly.     Vascular: No JVD.     Trachea: No tracheal deviation.  Cardiovascular:     Rate and Rhythm: Normal rate and regular rhythm.     Heart sounds: Normal heart sounds.  Pulmonary:     Effort: No  respiratory distress.     Breath sounds: No stridor. No wheezing.  Abdominal:     General: Bowel sounds are normal. There is no distension.     Palpations: Abdomen is soft. There is no mass.     Tenderness: There is no abdominal tenderness. There is no guarding or rebound.  Musculoskeletal:        General: No tenderness.     Cervical back: Normal range of motion and neck supple. No rigidity.  Lymphadenopathy:     Cervical: No cervical adenopathy.  Skin:    Findings: No erythema or rash.  Neurological:     Cranial Nerves: No cranial nerve deficit.     Motor: No abnormal muscle tone.     Coordination: Coordination normal.     Deep Tendon Reflexes: Reflexes normal.  Psychiatric:        Behavior: Behavior normal.        Thought Content: Thought content normal.        Judgment: Judgment normal.     Lab Results  Component Value Date   WBC 8.0 08/26/2023   HGB 13.3 08/26/2023   HCT 40.8 08/26/2023   PLT 431.0 (H) 08/26/2023   GLUCOSE 133 (H) 08/26/2023   CHOL 146 08/26/2023   TRIG 97.0 08/26/2023   HDL 53.60 08/26/2023   LDLCALC 73 08/26/2023   ALT 38 (H) 08/26/2023   AST 32 08/26/2023   NA 137 08/26/2023   K 4.2 08/26/2023   CL 101 08/26/2023   CREATININE 0.73 08/26/2023   BUN 23 08/26/2023   CO2 27 08/26/2023   TSH 2.22 08/26/2023   HGBA1C 6.5 08/26/2023    MM 3D SCREENING MAMMOGRAM BILATERAL BREAST Result Date: 11/18/2023 CLINICAL DATA:  Screening. EXAM: DIGITAL SCREENING BILATERAL MAMMOGRAM WITH TOMOSYNTHESIS AND CAD TECHNIQUE: Bilateral screening digital craniocaudal and mediolateral oblique mammograms were obtained. Bilateral screening digital breast tomosynthesis was performed. The images were evaluated with computer-aided detection. COMPARISON:  None.  Baseline study. ACR Breast Density Category b: There are scattered areas of fibroglandular density. FINDINGS: In the left breast, possible masses warrant further evaluation. In the right breast, no findings suspicious  for malignancy. IMPRESSION: Further evaluation is suggested for possible masses in the left breast. RECOMMENDATION: Diagnostic mammogram and possibly ultrasound of the left breast. (Code:[id number]) The patient will be contacted regarding the findings, and additional imaging will be scheduled. BI-RADS CATEGORY  0: Incomplete: Need additional imaging evaluation. Electronically Signed   By: Amie Portland M.D.   On: 11/18/2023 13:57  Assessment & Plan:   Problem List Items Addressed This Visit     Polysubstance abuse (HCC)   [facility]      PTSD (post-traumatic stress disorder)   Doing fair      Relevant Medications   hydrOXYzine (ATARAX) 50 MG tablet   Seizure disorder (HCC) - Primary   She is on Lamictal, gabapentin Neurology ref      Relevant Medications   SUMAtriptan (IMITREX) 100 MG tablet   Other Relevant Orders   Ambulatory referral to Neurology   Diabetes mellitus type 2, controlled (HCC)   Monitor A1c Increase Ozempic to 2 mg/wk      Relevant Medications   Semaglutide, 2 MG/DOSE, 8 MG/3ML SOPN      Meds ordered this encounter  Medications   methadone (DOLOPHINE) 10 MG tablet    Sig: Take 9 tablets (90 mg total) by mouth daily.   Semaglutide, 2 MG/DOSE, 8 MG/3ML SOPN    Sig: Inject 2 mg as directed once a week.    Dispense:  3 mL    Refill:  3   SUMAtriptan (IMITREX) 100 MG tablet    Sig: Take one prn migraine. May repeat in 2 hours if headache persists or recurs.    Dispense:  12 tablet    Refill:  5      Follow-up: Return in about 3 months (around 05/23/2024) for a follow-up visit.  Sonda Primes, MD

## 2024-02-21 NOTE — Assessment & Plan Note (Signed)
 Monitor A1c Increase Ozempic to 2 mg/wk

## 2024-03-07 ENCOUNTER — Ambulatory Visit: Payer: Self-pay

## 2024-03-07 NOTE — Telephone Encounter (Signed)
 Copied from CRM [id number]. Topic: Clinical - Red Word Triage >> Mar 07, 2024  4:24 PM Fredrich Romans wrote: Red Word that prompted transfer to Nurse Triage: poison ivy all over body,nausea ,vomiting   Chief Complaint:Skin Rash/Abdominal Cramping/Nausea/Vomiting/Urinary Symptoms Symptoms: rash, nausea, vomiting,  Frequency: 1.5 weeks ago Pertinent Negatives: Patient denies fever Disposition: [x] ED /[] Urgent Care (no appt availability in office) / [] Appointment(In office/virtual)/ []  [facility]/ [] Home Care/ [] Refused Recommended Disposition /[] [place] [facility]/ []  Follow-up with PCP Additional Notes: Patient called and advised that at the beginning of last week she was doing yard work and came in contact with poison ivy.  She states that she now has a rash across her entire abdomen and she is experiencing nausea/vomiting as well. Patient concerned for blood infection or sepsis. Patient states that last night she started having urinary frequency as well and thought she might be starting to get a urinary tract infection. Patient states that she had one episode of nausea/vomiting and she is also having abdominal cramping. Patient is very concerned about the rash that is spreading across her entire abdomen.  She is experiencing itching and burning.  She has been using topical creams for the itching that is helping some.  Patient states that she started to go to three different urgent cares yesterday to get checked out but she states she was told that she owed those three places money and they wouldn't see her at that time. With a possible allergic response to poison ivy and more than one body system being involved--integumentary and Gastrointestinal along with patient think she may be starting to have a Urinary Tract Infection due to frequency last night--it is recommended that the patient goes to the Emergency Room at this time for further assessment.  Patient is okay with this due to having  multiple concerns that she is worried about at this time.  She denies any fever at this time.  Patient is agreeable about going to the Emergency Room at this time for further evaluation.  She states that she will do that at this time.   Reason for Disposition  Patient sounds very sick or weak to the triager    Contact with poison ivy that patient has allergy to----in addition to multiple body systems being involved---skin (large rash spread across entire abdomen), Gastrointestinal (nausea/vomiting/abdominal cramping), and also stating that she believes she might be starting to get a Urinary Tract Infection due to Urinary Frequency last night--recommended patient be seen at the ER for these complaints at this time.  Answer Assessment - Initial Assessment Questions 1. APPEARANCE of RASH: "Describe the rash." (e.g., spots, blisters, raised areas, skin peeling, scaly)     Started as a few patches on her arms --pt scratched open wounds 2. SIZE: "How big are the spots?" (e.g., tip of pen, eraser, coin; inches, centimeters)     Arm patches of poison ivy 1.5 weeks ago and now patient states small red raised dots all over her abdomen that itch and burn.--pt  3. LOCATION: "Where is the rash located?"     Arms 1.5 weeks ago and abdominal rash 3-4 days ago 4. COLOR: "What color is the rash?" (Note: It is difficult to assess rash color in people with darker-colored skin. When this situation occurs, simply ask the caller to describe what they see.)     red 5. ONSET: "When did the rash begin?"     1.5 rashes on arms  6. FEVER: "Do you have a fever?"  If Yes, ask: "What is your temperature, how was it measured, and when did it start?"     No 7. ITCHING: "Does the rash itch?" If Yes, ask: "How bad is the itch?" (Scale 1-10; or mild, moderate, severe)     Itching about 3 out of 10---been using topical creams 8. CAUSE: "What do you think is causing the rash?"     Poison ivy and then possibly infection she is  unsure 9. MEDICINE FACTORS: "Have you started any new medicines within the last 2 weeks?" (e.g., antibiotics)      No 10. OTHER SYMPTOMS: "Do you have any other symptoms?" (e.g., dizziness, headache, sore throat, joint pain)       Nausea, Vomiting, abdominal cramping, pt states yesterday possibly starting a urinary tract infection---frequency 11. PREGNANCY: "Is there any chance you are pregnant?" "When was your last menstrual period?"       No---just finished last period  Protocols used: Rash or Redness - [facility]

## 2024-03-08 ENCOUNTER — Encounter (HOSPITAL_BASED_OUTPATIENT_CLINIC_OR_DEPARTMENT_OTHER): Payer: Self-pay

## 2024-03-08 ENCOUNTER — Emergency Department (HOSPITAL_BASED_OUTPATIENT_CLINIC_OR_DEPARTMENT_OTHER)
Admission: EM | Admit: 2024-03-08 | Discharge: 2024-03-08 | Disposition: A | Discharge status: Home / Self Care | Attending: Emergency Medicine | Admitting: Emergency Medicine

## 2024-03-08 ENCOUNTER — Other Ambulatory Visit: Payer: Self-pay | Admitting: Internal Medicine

## 2024-03-08 DIAGNOSIS — R21 Rash and other nonspecific skin eruption: Secondary | ICD-10-CM | POA: Insufficient documentation

## 2024-03-08 DIAGNOSIS — Z79899 Other long term (current) drug therapy: Secondary | ICD-10-CM | POA: Insufficient documentation

## 2024-03-08 DIAGNOSIS — Z7984 Long term (current) use of oral hypoglycemic drugs: Secondary | ICD-10-CM | POA: Insufficient documentation

## 2024-03-08 DIAGNOSIS — I10 Essential (primary) hypertension: Secondary | ICD-10-CM | POA: Insufficient documentation

## 2024-03-08 DIAGNOSIS — E119 Type 2 diabetes mellitus without complications: Secondary | ICD-10-CM | POA: Insufficient documentation

## 2024-03-08 LAB — URINALYSIS, ROUTINE W REFLEX MICROSCOPIC
Bilirubin Urine: NEGATIVE
Glucose, UA: NEGATIVE mg/dL
Ketones, ur: NEGATIVE mg/dL
Nitrite: NEGATIVE
Protein, ur: NEGATIVE mg/dL
Specific Gravity, Urine: 1.01 (ref 1.005–1.030)
pH: 5.5 (ref 5.0–8.0)

## 2024-03-08 LAB — CBC WITH DIFFERENTIAL/PLATELET
Abs Immature Granulocytes: 0.04 10*3/uL (ref 0.00–0.07)
Basophils Absolute: 0.1 10*3/uL (ref 0.0–0.1)
Basophils Relative: 1 %
Eosinophils Absolute: 0.6 10*3/uL — ABNORMAL HIGH (ref 0.0–0.5)
Eosinophils Relative: 7 %
HCT: 34.7 % — ABNORMAL LOW (ref 36.0–46.0)
Hemoglobin: 11.2 g/dL — ABNORMAL LOW (ref 12.0–15.0)
Immature Granulocytes: 0 %
Lymphocytes Relative: 22 %
Lymphs Abs: 2 10*3/uL (ref 0.7–4.0)
MCH: 25.1 pg — ABNORMAL LOW (ref 26.0–34.0)
MCHC: 32.3 g/dL (ref 30.0–36.0)
MCV: 77.6 fL — ABNORMAL LOW (ref 80.0–100.0)
Monocytes Absolute: 0.7 10*3/uL (ref 0.1–1.0)
Monocytes Relative: 8 %
Neutro Abs: 5.7 10*3/uL (ref 1.7–7.7)
Neutrophils Relative %: 62 %
Platelets: 360 10*3/uL (ref 150–400)
RBC: 4.47 MIL/uL (ref 3.87–5.11)
RDW: 14.4 % (ref 11.5–15.5)
WBC: 9.1 10*3/uL (ref 4.0–10.5)
nRBC: 0 % (ref 0.0–0.2)

## 2024-03-08 LAB — URINALYSIS, MICROSCOPIC (REFLEX)

## 2024-03-08 LAB — COMPREHENSIVE METABOLIC PANEL WITH GFR
ALT: 27 U/L (ref 0–44)
AST: 22 U/L (ref 15–41)
Albumin: 3.5 g/dL (ref 3.5–5.0)
Alkaline Phosphatase: 88 U/L (ref 38–126)
Anion gap: 7 (ref 5–15)
BUN: 15 mg/dL (ref 6–20)
CO2: 25 mmol/L (ref 22–32)
Calcium: 8.8 mg/dL — ABNORMAL LOW (ref 8.9–10.3)
Chloride: 103 mmol/L (ref 98–111)
Creatinine, Ser: 0.58 mg/dL (ref 0.44–1.00)
GFR, Estimated: >60 mL/min (ref >60)
Glucose, Bld: 122 mg/dL — ABNORMAL HIGH (ref 70–99)
Potassium: 3.6 mmol/L (ref 3.5–5.1)
Sodium: 135 mmol/L (ref 135–145)
Total Bilirubin: 0.2 mg/dL (ref 0.0–1.2)
Total Protein: 6.4 g/dL — ABNORMAL LOW (ref 6.5–8.1)

## 2024-03-08 LAB — SEDIMENTATION RATE: Sed Rate: 7 mm/h (ref 0–22)

## 2024-03-08 LAB — C-REACTIVE PROTEIN: CRP: 0.6 mg/dL (ref <1.0)

## 2024-03-08 LAB — LACTIC ACID, PLASMA: Lactic Acid, Venous: 1.3 mmol/L (ref 0.5–1.9)

## 2024-03-08 MED ORDER — TRIAMCINOLONE ACETONIDE 0.1 % EX CREA: 1.0000 | TOPICAL_CREAM | Freq: Two times a day (BID) | CUTANEOUS | 0 refills | Status: AC

## 2024-03-08 NOTE — ED Provider Notes (Signed)
 [place] EMERGENCY DEPARTMENT AT [facility] Provider Note   CSN: [id number] Arrival date & time: 03/08/24  4098     History  Chief Complaint  Patient presents with   Rash    [name] is a 43 y.o. female.   Rash    43 year old female with medical history significant for anxiety, depression, ADHD, polysubstance abuse, HTN, nephrolithiasis, seizures on Lamictal and oxcarbazepine, bipolar disorder, heroin abuse, alcohol abuse, DM2 who presents to the Emergency Department with a generalized rash.  The patient states that she was exposed to poison ivy on her arms last week while working outside.  She had been applying over-the-counter ointments and those lesions along her arms have been healing.  She had an episode of vomiting several days ago but is on Ozempic.  This has since resolved.  She states that over the past 5 days she has had an itchy maculopapular rash to her abdomen.  She denies any fevers, chills.  She is tolerating oral intake, denies any difficulty swallowing or speaking, difficulty breathing.  She is concerned that she could be septic. The patient denies any mucous membrane involvement or involvement of the palms or soles of her feet. She does state that her doses of Lamictal and oxcarbazepine were recently increased 2 weeks ago prior to the development of this rash.  Home Medications Prior to Admission medications   Medication Sig Start Date End Date Taking? Authorizing Provider  triamcinolone cream (KENALOG) 0.1 % Apply 1 Application topically 2 (two) times daily. 03/08/24  Yes Ernie Avena, MD  amLODipine (NORVASC) 5 MG tablet Take 1 tablet (5 mg total) by mouth daily. 09/20/23 10/20/23  Plotnikov, Georgina Quint, MD  Cholecalciferol (VITAMIN D3) 50 MCG (2000 UT) capsule Take 1 capsule (2,000 Units total) by mouth daily. 09/20/23   Plotnikov, Georgina Quint, MD  cloNIDine (CATAPRES) 0.1 MG tablet TAKE 1 TABLET BY MOUTH 2 TIMES DAILY. 02/14/24   Plotnikov, Georgina Quint, MD  gabapentin (NEURONTIN) 300 MG capsule Take 1 capsule (300 mg total) by mouth 3 (three) times daily. 07/06/23 08/05/23  Lauree Chandler, NP  hydrALAZINE (APRESOLINE) 50 MG tablet Take 1 tablet (50 mg total) by mouth 3 (three) times daily. 09/20/23 10/20/23  Plotnikov, Georgina Quint, MD  hydrOXYzine (ATARAX) 50 MG tablet Take 50 mg by mouth every 8 (eight) hours as needed. 12/21/23   [provider]  lamoTRIgine (LAMICTAL) 150 MG tablet Take 150 mg by mouth daily. 08/23/23   [provider]  lisdexamfetamine (VYVANSE) 70 MG capsule Take 1 capsule (70 mg total) by mouth daily. 02/14/24     meloxicam (MOBIC) 15 MG tablet Take 1 tablet (15 mg total) by mouth daily as needed for pain. 11/22/23   Plotnikov, Georgina Quint, MD  metFORMIN (GLUCOPHAGE) 500 MG tablet Take 1 tablet (500 mg total) by mouth daily with breakfast. 09/20/23   Plotnikov, Georgina Quint, MD  methadone (DOLOPHINE) 10 MG tablet Take 9 tablets (90 mg total) by mouth daily. 02/21/24   Plotnikov, Georgina Quint, MD  Multiple Vitamins-Minerals (CENTRUM ADULTS) TABS 1 po qd 09/20/23   Plotnikov, Georgina Quint, MD  Oxcarbazepine (TRILEPTAL) 300 MG tablet Take 1 tablet (300 mg total) by mouth 2 (two) times daily. 07/06/23 08/05/23  Lauree Chandler, NP  Semaglutide, 1 MG/DOSE, (OZEMPIC, 1 MG/DOSE,) 4 MG/3ML SOPN INJECT 1MG  INTO THE SKIN ONCE A WEEK 03/08/24   Plotnikov, Georgina Quint, MD  Semaglutide, 2 MG/DOSE, 8 MG/3ML SOPN Inject 2 mg as directed once a  week. 02/21/24   Plotnikov, Georgina Quint, MD  senna-docusate (SENOKOT-S) 8.6-50 MG tablet Take 1 tablet by mouth 2 (two) times daily as needed for moderate constipation. 09/20/23   Plotnikov, Georgina Quint, MD  SUMAtriptan (IMITREX) 100 MG tablet Take one prn migraine. May repeat in 2 hours if headache persists or recurs. 02/21/24   Plotnikov, Georgina Quint, MD  traZODone (DESYREL) 100 MG tablet Take 1 tablet (100 mg total) by mouth at bedtime. 07/06/23 08/05/23  Lauree Chandler, NP  VYVANSE 60 MG capsule  Take 60 mg by mouth daily. 09/08/23   [provider]      Allergies    Shellfish allergy and Meat extract    Review of Systems   Review of Systems  Skin:  Positive for rash.  All other systems reviewed and are negative.   Physical Exam Updated Vital Signs BP (!) 180/70 (BP Location: Right Arm)   Pulse 90   Temp 98.6 F (37 C) (Oral)   Resp 18   Ht 5\' 6"  (1.676 m)   LMP 03/03/2024   SpO2 100%   BMI 37.93 kg/m  Physical Exam Vitals and nursing note reviewed.  Constitutional:      General: She is not in acute distress. HENT:     Head: Normocephalic and atraumatic.  Eyes:     Conjunctiva/sclera: Conjunctivae normal.     Pupils: Pupils are equal, round, and reactive to light.  Cardiovascular:     Rate and Rhythm: Normal rate and regular rhythm.  Pulmonary:     Effort: Pulmonary effort is normal. No respiratory distress.  Abdominal:     General: There is no distension.     Tenderness: There is no guarding.  Musculoskeletal:        General: No deformity or signs of injury.     Cervical back: Neck supple.  Skin:    Findings: Rash present.     Comments: Scattered petechiae and maculopapular rash to the trunk of the abdomen, some excoriations present, no evidence of cellulitis, nonblanching lesions noted. Erythematous macules to arms bilaterally. No mucosal involvement orally, no involvement of the palms or soles of the feet.  Neurological:     General: No focal deficit present.     Mental Status: She is alert. Mental status is at baseline.           ED Results / Procedures / Treatments   Labs (all labs ordered are listed, but only abnormal results are displayed) Labs Reviewed  CBC WITH DIFFERENTIAL/PLATELET - Abnormal; Notable for the following components:      Result Value   Hemoglobin 11.2 (*)    HCT 34.7 (*)    MCV 77.6 (*)    MCH 25.1 (*)    Eosinophils Absolute 0.6 (*)    All other components within normal limits  COMPREHENSIVE METABOLIC  PANEL WITH GFR - Abnormal; Notable for the following components:   Glucose, Bld 122 (*)    Calcium 8.8 (*)    Total Protein 6.4 (*)    All other components within normal limits  URINALYSIS, ROUTINE W REFLEX MICROSCOPIC - Abnormal; Notable for the following components:   APPearance CLOUDY (*)    Hgb urine dipstick MODERATE (*)    Leukocytes,Ua MODERATE (*)    All other components within normal limits  URINALYSIS, MICROSCOPIC (REFLEX) - Abnormal; Notable for the following components:   Bacteria, UA FEW (*)    All other components within normal limits  CULTURE, BLOOD (ROUTINE X 2)  CULTURE, BLOOD (ROUTINE X 2)  SEDIMENTATION RATE  LACTIC ACID, PLASMA  C-REACTIVE PROTEIN  LAMOTRIGINE LEVEL  10-HYDROXYCARBAZEPINE    EKG None  Radiology No results found.  Procedures Procedures    Medications Ordered in ED Medications - No data to display  ED Course/ Medical Decision Making/ A&P                                 Medical Decision Making Amount and/or Complexity of Data Reviewed Labs: ordered.  Risk Prescription drug management.   43 year old female with medical history significant for anxiety, depression, ADHD, polysubstance abuse, HTN, nephrolithiasis, seizures on Lamictal and oxcarbazepine, bipolar disorder, heroin abuse, alcohol abuse, DM2 who presents to the Emergency Department with a generalized rash.  The patient states that she was exposed to poison ivy on her arms last week while working outside.  She had been applying over-the-counter ointments and those lesions along her arms have been healing.  She had an episode of vomiting several days ago but is on Ozempic.  This has since resolved.  She states that over the past 5 days she has had an itchy maculopapular rash to her abdomen.  She denies any fevers, chills.  She is tolerating oral intake, denies any difficulty swallowing or speaking, difficulty breathing.  She is concerned that she could be septic. The patient  denies any mucous membrane involvement or involvement of the palms or soles of her feet.  She does state that her doses of Lamictal and oxcarbazepine were recently increased 2 weeks ago prior to the development of this rash.  On arrival, the patient was well-appearing and vitally stable, afebrile, not tachycardic or tachypneic, saturating well on room air.  Hypertensive BP 181/92.  [name] is a 43 y.o. female who presents with abdominal rash as per above. I have reviewed the nursing documentation for past medical history, family history, and social history. I have reviewed the EMR and have learned that the patient is on Lamictal and oxcarbazepine.  Currently is awake, alert, and hemodynamically stable. Her exam is most notable for Scattered petechiae and maculopapular rash to the trunk of the abdomen, some excoriations present, no evidence of cellulitis, nonblanching lesions noted. Erythematous macules to arms bilaterally. No mucosal involvement orally, no involvement of the palms or soles of the feet..  Differential Diagnoses: There are no red flag symptoms such as fever, mucosal involvement, hypotension, toxic appearance, severe pain, immunosuppression. However, due to concerning medication use, initial lab workup was initiated. She does state that her doses of Lamictal and oxcarbazepine were recently increased 2 weeks ago prior to the development of this rash.  There are no physical exam findings or symptoms on ROS concerning for erythema multiforme, SJS/TEN, Lyme disease, cellulitis, necrotizing fasciitis, purupra fulminans, angioedema, anaphylaxis, meningococcemia, or RMSF. I do not think that the patient is septic.  Laboratory evaluation to include blood cultures x 2, Lamictal level, Rx oxcarbazepine level were sent and pending.  Lactic acid was normal ESR was normal, urinalysis unconvincing for UTI and the patient has not had any urinary symptoms. CBC without leukocytosis. Mild anemia to  11.2 present. Esosinophilia present to 0.6. Due to this, will reach out to [facility] via their Physician Access Line.  I spoke with on-call dermatology at [facility], Dr. Orlene Erm.  Symptoms less concerning for dress syndrome.  No mucosal involvement, low suspicion for SJS.  Per the registrar score for drug  reaction with eosinophilia and systemic symptoms, patient course unlikely for dress.  Will have the patient apply triamcinolone cream to her rash which appears to be improving.  [facility] dermatology will schedule outpatient follow-up.  I believe the patient  is safe for discharge home. The family feels safe with this plan. I prescribed triamcinolone. They agreed to followup with their PCP.   Final Clinical Impression(s) / ED Diagnoses Final diagnoses:  Rash    Rx / DC Orders ED Discharge Orders          Ordered    triamcinolone cream (KENALOG) 0.1 %  2 times daily        03/08/24 2956              Ernie Avena, MD 03/08/24 1001

## 2024-03-08 NOTE — Discharge Instructions (Addendum)
 Your clinical presentation and laboratory evaluation was overall reassuring.  Recommend you trial triamcinolone cream which has been prescribed.  We did discuss your case with on-call dermatology and someone from their office should be calling you to schedule a follow-up appointment to ensure resolution.

## 2024-03-08 NOTE — ED Triage Notes (Signed)
 Pt. States she got poison ivy over beginning of last week thatt started on her arms. 4-5 days ago it spread over her stomach like a rash and that it doesn't look like poison ivy. Wants to make sure she isn't septic or need antibiotics. Denies shortness of breath. Hx Hep C. "I'm immunocompromised and want to be sure"

## 2024-03-08 NOTE — ED Provider Triage Note (Signed)
 Emergency Medicine Provider Triage Evaluation Note  [name] , a 43 y.o. female  was evaluated in triage.  Pt complains of itchy rash to abdomen x 5 days.  Exposed to poison ivy on her arms last week but this has improved.  No difficulty breathing or difficulty swallowing.  No chest pain or shortness of breath.  1 episode of vomiting several days ago but does take Ozempic. No new medications.  She does take multiple antiepileptics including Lamictal.  On methadone. Hx hepatitis C and Previous IV substance abuse but has been clean for 4 years. No mucosal lesions  Review of Systems  Positive: Rash Negative: Fever, difficulty breathing, difficulty swallowing  Physical Exam  BP (!) 181/92   Pulse 97   Temp 98.7 F (37.1 C) (Oral)   Resp 20   Ht 5\' 6"  (1.676 m)   LMP 03/03/2024   SpO2 99%   BMI 37.93 kg/m  Gen:   Awake, no distress   Resp:  Normal effort  MSK:   Moves extremities without difficulty  Other:  Excoriated papules to abdomen No mucosal lesions Medical Decision Making  Medically screening exam initiated at 6:51 AM.  Appropriate orders placed.  [name] was informed that the remainder of the evaluation will be completed by another provider, this initial triage assessment does not replace that evaluation, and the importance of remaining in the ED until their evaluation is complete.  Patient appears well and nontoxic.  She is on Lamictal which can cause a drug rash.  Also consider Stevens-Johnson syndrome will check labs.   Glynn Octave, MD 03/08/24 [phone]

## 2024-03-10 ENCOUNTER — Other Ambulatory Visit: Payer: Self-pay | Admitting: Internal Medicine

## 2024-03-10 ENCOUNTER — Other Ambulatory Visit (HOSPITAL_COMMUNITY): Payer: Self-pay

## 2024-03-10 MED ORDER — LISDEXAMFETAMINE DIMESYLATE 70 MG PO CAPS
70.0000 mg | ORAL_CAPSULE | Freq: Every day | ORAL | 0 refills | Status: DC
  Filled 2024-03-13: qty 30, 30d supply, fill #0

## 2024-03-13 ENCOUNTER — Other Ambulatory Visit (HOSPITAL_COMMUNITY): Payer: Self-pay

## 2024-03-13 LAB — CULTURE, BLOOD (ROUTINE X 2)
Culture: NO GROWTH
Culture: NO GROWTH

## 2024-03-13 LAB — LAMOTRIGINE LEVEL: Lamotrigine Lvl: 3.5 ug/mL (ref 2.0–20.0)

## 2024-03-15 LAB — 10-HYDROXYCARBAZEPINE

## 2024-03-17 ENCOUNTER — Other Ambulatory Visit: Payer: Self-pay

## 2024-03-20 ENCOUNTER — Other Ambulatory Visit: Payer: Self-pay | Admitting: Internal Medicine

## 2024-03-30 ENCOUNTER — Other Ambulatory Visit: Payer: Self-pay | Admitting: Internal Medicine

## 2024-04-25 ENCOUNTER — Other Ambulatory Visit (HOSPITAL_COMMUNITY): Payer: Self-pay

## 2024-04-26 ENCOUNTER — Other Ambulatory Visit (HOSPITAL_COMMUNITY): Payer: Self-pay

## 2024-04-26 MED ORDER — LISDEXAMFETAMINE DIMESYLATE 70 MG PO CAPS
70.0000 mg | ORAL_CAPSULE | Freq: Every day | ORAL | 0 refills | Status: DC
  Filled 2024-04-26: qty 2, 2d supply, fill #0

## 2024-04-28 ENCOUNTER — Other Ambulatory Visit (HOSPITAL_BASED_OUTPATIENT_CLINIC_OR_DEPARTMENT_OTHER): Payer: Self-pay

## 2024-04-28 ENCOUNTER — Other Ambulatory Visit (HOSPITAL_COMMUNITY): Payer: Self-pay

## 2024-04-28 MED ORDER — LISDEXAMFETAMINE DIMESYLATE 70 MG PO CAPS
70.0000 mg | ORAL_CAPSULE | Freq: Every day | ORAL | 0 refills | Status: DC
  Filled 2024-04-28 (×2): qty 30, 30d supply, fill #0

## 2024-05-03 ENCOUNTER — Other Ambulatory Visit (HOSPITAL_COMMUNITY): Payer: Self-pay

## 2024-05-23 ENCOUNTER — Encounter: Payer: Self-pay | Admitting: Internal Medicine

## 2024-05-23 ENCOUNTER — Ambulatory Visit (INDEPENDENT_AMBULATORY_CARE_PROVIDER_SITE_OTHER): Admitting: Internal Medicine

## 2024-05-23 VITALS — BP 120/80 | HR 90 | Temp 98.9°F | Ht 66.0 in | Wt 210.0 lb

## 2024-05-23 DIAGNOSIS — F1910 Other psychoactive substance abuse, uncomplicated: Secondary | ICD-10-CM

## 2024-05-23 DIAGNOSIS — E118 Type 2 diabetes mellitus with unspecified complications: Secondary | ICD-10-CM | POA: Diagnosis not present

## 2024-05-23 DIAGNOSIS — B182 Chronic viral hepatitis C: Secondary | ICD-10-CM

## 2024-05-23 DIAGNOSIS — F902 Attention-deficit hyperactivity disorder, combined type: Secondary | ICD-10-CM

## 2024-05-23 DIAGNOSIS — Z7985 Long-term (current) use of injectable non-insulin antidiabetic drugs: Secondary | ICD-10-CM | POA: Diagnosis not present

## 2024-05-23 DIAGNOSIS — E559 Vitamin D deficiency, unspecified: Secondary | ICD-10-CM

## 2024-05-23 MED ORDER — RIZATRIPTAN BENZOATE 10 MG PO TABS: 10.0000 mg | ORAL_TABLET | Freq: Once | ORAL | 12 refills | Status: DC | PRN

## 2024-05-23 NOTE — Assessment & Plan Note (Signed)
 Aveah saw Tracy Dull, NP w/[facility]: she was made aware that in order to treat her hepatitis C she would need to be transitioned off of oxcarbazepine  as it interacts with all available hepatitis C medications. Pt states she can't do w/o oxcarbazepine 

## 2024-05-23 NOTE — Assessment & Plan Note (Signed)
She is on Vyvanse prescribed by her psychiatrist

## 2024-05-23 NOTE — Assessment & Plan Note (Signed)
 On Vit D

## 2024-05-23 NOTE — Assessment & Plan Note (Signed)
 On methadone 90 mg/d - [facility]

## 2024-05-23 NOTE — Assessment & Plan Note (Signed)
 Monitor A1c Increase Ozempic  to 2 mg/wk  Wt Readings from Last 3 Encounters:  05/23/24 210 lb (95.3 kg)  02/21/24 235 lb (106.6 kg)  11/22/23 243 lb (110.2 kg)

## 2024-05-23 NOTE — Progress Notes (Signed)
 Subjective:  Patient ID: [name], female    DOB: [birth year]  Age: 43 y.o. MRN: [id number]  CC: Follow-up (Pt would like a referral for hep- c pt states that she is not sleeping well at night )   HPI [name] presents for Hep C, DM, ADD, anxiety/depression. She is here w/her friend  Per GI notes:  Conrad Delaware, CMA - 04/14/2024 1:14 PM EDT Formatting of this note might be different from the original. I called the patient and introduced myself as Megan from [employer]. During the call, she was difficult to understand. I explained that I was calling to schedule a fibroscan and an office visit, but she abruptly hung up the phone. Electronically signed by Conrad Delaware, CMA at 04/14/2024 1:22 PM EDT  Back to top of Progress Notes Conrad Delaware, CMA - 04/13/2024 9:22 AM EDT Formatting of this note might be different from the original. LVM for patient to call to schedule fibroscan and office visit. Electronically signed by Conrad Delaware, CMA at 04/13/2024 9:22 AM EDT  Back to top of Progress Notes Conrad Delaware, CMA - 04/12/2024 1:54 PM EDT Formatting of this note might be different from the original. I contacted the patient to emphasize the need for transitioning off of oxcarbazepine . The patient expressed concerns, stating that her seizures remain severe and that she is unable to discontinue the medication. She also mentioned that her provider is aware of the situation, as [name] has already communicated with them, but there are no plans to alter her current oxcarbazepine  regimen.  The patient ended the call abruptly before I could schedule a follow-up appointment. I will attempt to reach her again later to arrange the follow-up and a FibroScan. Electronically signed by Conrad Delaware, CMA at 04/12/2024 2:02 PM EDT  Back to top of Progress Notes Damita Dull, NP - 04/11/2024 3:52 PM EDT Formatting of this note might be different from the original. Patient had been scheduled on 04/11/2024  for follow-up office visit to discuss treatment for her hepatitis C. She canceled this appointment.  At the time of her initial consult in February 2025 she was made aware that in order to treat her hepatitis C she would need to be transitioned off of oxcarbazepine  as it interacts with all available hepatitis C medications.  Review of care everywhere and outside pharmacy records shows the patient is still taking oxcarbazepine  and was most recently filled in April.  Patient can be rescheduled to next available FibroScan and follow-up visit combination.  I am also sending a letter to the patient and her providers again reiterating the need for her to be transitioned off of oxcarbazepine . Electronically signed by Damita Dull, NP at 04/11/2024  Outpatient Medications Prior to Visit  Medication Sig Dispense Refill   amLODipine  (NORVASC ) 5 MG tablet Take 1 tablet (5 mg total) by mouth daily. 90 tablet 3   Cholecalciferol (VITAMIN D3) 50 MCG (2000 UT) capsule Take 1 capsule (2,000 Units total) by mouth daily. 100 capsule 3   cloNIDine  (CATAPRES ) 0.1 MG tablet TAKE 1 TABLET BY MOUTH 2 TIMES DAILY. 180 tablet 1   gabapentin  (NEURONTIN ) 300 MG capsule Take 1 capsule (300 mg total) by mouth 3 (three) times daily. 90 capsule 0   hydrALAZINE  (APRESOLINE ) 50 MG tablet TAKE 1 TABLET BY MOUTH THREE TIMES A DAY 90 tablet 3   hydrOXYzine  (ATARAX ) 50 MG tablet Take 50 mg by mouth every 8 (eight) hours as needed.  lamoTRIgine  (LAMICTAL ) 150 MG tablet Take 150 mg by mouth daily.     lisdexamfetamine (VYVANSE ) 70 MG capsule Take 1 capsule (70 mg total) by mouth daily. 30 capsule 0   meloxicam  (MOBIC ) 15 MG tablet TAKE 1 TABLET BY MOUTH EVERY DAY AS NEEDED FOR PAIN 30 tablet 3   metFORMIN  (GLUCOPHAGE ) 500 MG tablet Take 1 tablet (500 mg total) by mouth daily with breakfast. 90 tablet 3   methadone  (DOLOPHINE ) 10 MG tablet Take 9 tablets (90 mg total) by mouth daily.     Multiple Vitamins-Minerals (CENTRUM  ADULTS) TABS 1 po qd 100 tablet 3   Oxcarbazepine  (TRILEPTAL ) 300 MG tablet Take 1 tablet (300 mg total) by mouth 2 (two) times daily. 60 tablet 0   Semaglutide , 1 MG/DOSE, (OZEMPIC , 1 MG/DOSE,) 4 MG/3ML SOPN INJECT 1MG  INTO THE SKIN ONCE A WEEK 3 mL 3   Semaglutide , 2 MG/DOSE, 8 MG/3ML SOPN Inject 2 mg as directed once a week. 3 mL 3   senna-docusate (SENOKOT-S) 8.6-50 MG tablet Take 1 tablet by mouth 2 (two) times daily as needed for moderate constipation. 100 tablet 3   SUMAtriptan  (IMITREX ) 100 MG tablet Take one prn migraine. May repeat in 2 hours if headache persists or recurs. 12 tablet 5   traZODone  (DESYREL ) 100 MG tablet Take 1 tablet (100 mg total) by mouth at bedtime. 30 tablet 0   triamcinolone  cream (KENALOG ) 0.1 % Apply 1 Application topically 2 (two) times daily. 454 g 0   VYVANSE  60 MG capsule Take 60 mg by mouth daily. (Patient not taking: Reported on 05/23/2024)     No facility-administered medications prior to visit.    ROS: Review of Systems  Constitutional:  Negative for activity change, appetite change, chills, fatigue and unexpected weight change.  HENT:  Negative for congestion, mouth sores and sinus pressure.   Eyes:  Negative for visual disturbance.  Respiratory:  Negative for cough and chest tightness.   Gastrointestinal:  Negative for abdominal pain and nausea.  Genitourinary:  Negative for difficulty urinating, frequency and vaginal pain.  Musculoskeletal:  Negative for back pain and gait problem.  Skin:  Negative for pallor and rash.  Neurological:  Negative for dizziness, tremors, weakness, numbness and headaches.  Psychiatric/Behavioral:  Negative for confusion and sleep disturbance.     Objective:  BP 120/80 (BP Location: Left Arm, Patient Position: Sitting, Cuff Size: Normal)   Pulse 90   Temp 98.9 F (37.2 C) (Oral)   Ht 5' 6 (1.676 m)   Wt 210 lb (95.3 kg)   SpO2 97%   BMI 33.89 kg/m   BP Readings from Last 3 Encounters:  05/23/24 120/80   03/08/24 (!) 180/70  02/21/24 130/84    Wt Readings from Last 3 Encounters:  05/23/24 210 lb (95.3 kg)  02/21/24 235 lb (106.6 kg)  11/22/23 243 lb (110.2 kg)    Physical Exam Constitutional:      General: She is not in acute distress.    Appearance: She is well-developed. She is obese.  HENT:     Head: Normocephalic.     Right Ear: External ear normal.     Left Ear: External ear normal.     Nose: Nose normal.   Eyes:     General:        Right eye: No discharge.        Left eye: No discharge.     Conjunctiva/sclera: Conjunctivae normal.     Pupils: Pupils are equal, round, and reactive  to light.   Neck:     Thyroid : No thyromegaly.     Vascular: No JVD.     Trachea: No tracheal deviation.   Cardiovascular:     Rate and Rhythm: Normal rate and regular rhythm.     Heart sounds: Normal heart sounds.  Pulmonary:     Effort: No respiratory distress.     Breath sounds: No stridor. No wheezing.  Abdominal:     General: Bowel sounds are normal. There is no distension.     Palpations: Abdomen is soft. There is no mass.     Tenderness: There is no abdominal tenderness. There is no guarding or rebound.   Musculoskeletal:        General: No tenderness.     Cervical back: Normal range of motion and neck supple. No rigidity.  Lymphadenopathy:     Cervical: No cervical adenopathy.   Skin:    Findings: No erythema or rash.   Neurological:     Mental Status: She is oriented to person, place, and time.     Cranial Nerves: No cranial nerve deficit.     Motor: No abnormal muscle tone.     Coordination: Coordination normal.     Deep Tendon Reflexes: Reflexes normal.   Psychiatric:        Behavior: Behavior normal.        Thought Content: Thought content normal.        Judgment: Judgment normal.   Involuntary movements are present  Lab Results  Component Value Date   WBC 9.1 03/08/2024   HGB 11.2 (L) 03/08/2024   HCT 34.7 (L) 03/08/2024   PLT 360 03/08/2024    GLUCOSE 122 (H) 03/08/2024   CHOL 146 08/26/2023   TRIG 97.0 08/26/2023   HDL 53.60 08/26/2023   LDLCALC 73 08/26/2023   ALT 27 03/08/2024   AST 22 03/08/2024   NA 135 03/08/2024   K 3.6 03/08/2024   CL 103 03/08/2024   CREATININE 0.58 03/08/2024   BUN 15 03/08/2024   CO2 25 03/08/2024   TSH 2.22 08/26/2023   HGBA1C 6.5 08/26/2023    No results found.  Assessment & Plan:   Problem List Items Addressed This Visit     Polysubstance abuse (HCC)   On methadone  90 mg/d - [facility]      ADHD (attention deficit hyperactivity disorder)   She is on Vyvanse  prescribed by her psychiatrist      Hepatitis C - Primary   Anelle saw Damita Dull, NP w/[facility]: she was made aware that in order to treat her hepatitis C she would need to be transitioned off of oxcarbazepine  as it interacts with all available hepatitis C medications. Pt states she can't do w/o oxcarbazepine        Relevant Orders   Ambulatory referral to Gastroenterology   Diabetes mellitus type 2, controlled (HCC)   Monitor A1c Increase Ozempic  to 2 mg/wk  Wt Readings from Last 3 Encounters:  05/23/24 210 lb (95.3 kg)  02/21/24 235 lb (106.6 kg)  11/22/23 243 lb (110.2 kg)         Vitamin D  deficiency   On Vit D         Meds ordered this encounter  Medications   rizatriptan (MAXALT) 10 MG tablet    Sig: Take 1 tablet (10 mg total) by mouth once as needed for up to 1 dose for migraine. May repeat in 2 hours if needed    Dispense:  12  tablet    Refill:  12      Follow-up: Return in about 3 months (around 08/23/2024) for a follow-up visit.  Tracy Barn, MD

## 2024-07-15 ENCOUNTER — Other Ambulatory Visit: Payer: Self-pay | Admitting: Internal Medicine

## 2024-08-04 ENCOUNTER — Other Ambulatory Visit: Payer: Self-pay | Admitting: Internal Medicine

## 2024-08-23 ENCOUNTER — Ambulatory Visit (INDEPENDENT_AMBULATORY_CARE_PROVIDER_SITE_OTHER): Admitting: Internal Medicine

## 2024-08-23 ENCOUNTER — Telehealth: Payer: Self-pay | Admitting: Internal Medicine

## 2024-08-23 ENCOUNTER — Encounter: Payer: Self-pay | Admitting: Internal Medicine

## 2024-08-23 VITALS — BP 130/84 | HR 96 | Temp 98.0°F | Ht 66.5 in | Wt 218.8 lb

## 2024-08-23 DIAGNOSIS — E118 Type 2 diabetes mellitus with unspecified complications: Secondary | ICD-10-CM | POA: Diagnosis not present

## 2024-08-23 DIAGNOSIS — F902 Attention-deficit hyperactivity disorder, combined type: Secondary | ICD-10-CM | POA: Diagnosis not present

## 2024-08-23 DIAGNOSIS — K219 Gastro-esophageal reflux disease without esophagitis: Secondary | ICD-10-CM

## 2024-08-23 DIAGNOSIS — G43109 Migraine with aura, not intractable, without status migrainosus: Secondary | ICD-10-CM | POA: Diagnosis not present

## 2024-08-23 MED ORDER — NURTEC 75 MG PO TBDP: ORAL_TABLET | ORAL | 5 refills | Status: AC

## 2024-08-23 MED ORDER — PANTOPRAZOLE SODIUM 40 MG PO TBEC: 40.0000 mg | DELAYED_RELEASE_TABLET | Freq: Every day | ORAL | 3 refills | Status: AC

## 2024-08-23 NOTE — Assessment & Plan Note (Signed)
 Worse Start Protonix  40 mg/d

## 2024-08-23 NOTE — Telephone Encounter (Signed)
 Patient dropped off document Pre-Operative Med Hist and Clearance, to be filled out by provider. Patient requested to send it back via Call Patient to pick up within 7-days. Document is located in providers tray at front office.Please advise at Mobile [phone] (mobile)

## 2024-08-23 NOTE — Progress Notes (Signed)
 Subjective:  Patient ID: [name], female    DOB: 05/21/1981  Age: 42 y.o. MRN: [id number]  CC: Follow-up (Patient states she is having electro cosmetic surgery on October 24th. Patient is emailing me the form. She needs it back today. Having both knees replaced soon.)   HPI [name] presents for knee OA - Dr Sharl, ADD, DM  Outpatient Medications Prior to Visit  Medication Sig Dispense Refill   amphetamine-dextroamphetamine (ADDERALL) 15 MG tablet Take 15 mg by mouth 2 (two) times daily.     amLODipine  (NORVASC ) 5 MG tablet Take 1 tablet (5 mg total) by mouth daily. 90 tablet 3   Cholecalciferol (VITAMIN D3) 50 MCG (2000 UT) capsule Take 1 capsule (2,000 Units total) by mouth daily. 100 capsule 3   cloNIDine  (CATAPRES ) 0.1 MG tablet TAKE 1 TABLET BY MOUTH 2 TIMES DAILY. 180 tablet 1   gabapentin  (NEURONTIN ) 300 MG capsule Take 1 capsule (300 mg total) by mouth 3 (three) times daily. 90 capsule 0   hydrALAZINE  (APRESOLINE ) 50 MG tablet TAKE 1 TABLET BY MOUTH THREE TIMES A DAY 90 tablet 3   hydrOXYzine  (ATARAX ) 50 MG tablet Take 50 mg by mouth every 8 (eight) hours as needed.     lamoTRIgine  (LAMICTAL ) 150 MG tablet Take 150 mg by mouth daily.     meloxicam  (MOBIC ) 15 MG tablet TAKE 1 TABLET BY MOUTH EVERY DAY AS NEEDED FOR PAIN 30 tablet 3   metFORMIN  (GLUCOPHAGE ) 500 MG tablet TAKE 1 TABLET BY MOUTH EVERY DAY WITH BREAKFAST 90 tablet 3   methadone  (DOLOPHINE ) 10 MG tablet Take 9 tablets (90 mg total) by mouth daily.     Multiple Vitamins-Minerals (CENTRUM ADULTS) TABS 1 po qd 100 tablet 3   Oxcarbazepine  (TRILEPTAL ) 300 MG tablet Take 1 tablet (300 mg total) by mouth 2 (two) times daily. 60 tablet 0   Semaglutide , 2 MG/DOSE, (OZEMPIC , 2 MG/DOSE,) 8 MG/3ML SOPN INJECT 2 MG AS DIRECTED ONCE A WEEK. 3 mL 5   senna-docusate (SENOKOT-S) 8.6-50 MG tablet Take 1 tablet by mouth 2 (two) times daily as needed for moderate constipation. 100 tablet 3   traZODone  (DESYREL ) 100 MG  tablet Take 1 tablet (100 mg total) by mouth at bedtime. 30 tablet 0   triamcinolone  cream (KENALOG ) 0.1 % Apply 1 Application topically 2 (two) times daily. 454 g 0   lisdexamfetamine (VYVANSE ) 70 MG capsule Take 1 capsule (70 mg total) by mouth daily. (Patient not taking: Reported on 08/23/2024) 30 capsule 0   rizatriptan  (MAXALT ) 10 MG tablet Take 1 tablet (10 mg total) by mouth once as needed for up to 1 dose for migraine. May repeat in 2 hours if needed 12 tablet 12   Semaglutide , 1 MG/DOSE, (OZEMPIC , 1 MG/DOSE,) 4 MG/3ML SOPN INJECT 1MG  INTO THE SKIN ONCE A WEEK 3 mL 3   SUMAtriptan  (IMITREX ) 100 MG tablet Take one prn migraine. May repeat in 2 hours if headache persists or recurs. (Patient not taking: Reported on 08/23/2024) 12 tablet 5   VYVANSE  60 MG capsule Take 60 mg by mouth daily. (Patient not taking: Reported on 05/23/2024)     No facility-administered medications prior to visit.    ROS: Review of Systems  Constitutional:  Negative for activity change, appetite change, chills, fatigue and unexpected weight change.  HENT:  Negative for congestion, mouth sores and sinus pressure.   Eyes:  Negative for visual disturbance.  Respiratory:  Negative for cough and chest tightness.   Gastrointestinal:  Negative for abdominal pain and nausea.  Genitourinary:  Negative for difficulty urinating, frequency and vaginal pain.  Musculoskeletal:  Positive for arthralgias and gait problem. Negative for back pain.  Skin:  Negative for pallor and rash.  Neurological:  Negative for dizziness, tremors, weakness, numbness and headaches.  Psychiatric/Behavioral:  Negative for confusion, sleep disturbance and suicidal ideas. The patient is nervous/anxious.     Objective:  BP 130/84   Pulse 96   Temp 98 F (36.7 C) (Oral)   Ht 5' 6.5 (1.689 m)   Wt 218 lb 12.8 oz (99.2 kg)   SpO2 97%   BMI 34.79 kg/m   BP Readings from Last 3 Encounters:  08/23/24 130/84  05/23/24 120/80  03/08/24 (!) 180/70     Wt Readings from Last 3 Encounters:  08/23/24 218 lb 12.8 oz (99.2 kg)  05/23/24 210 lb (95.3 kg)  02/21/24 235 lb (106.6 kg)    Physical Exam Constitutional:      General: She is not in acute distress.    Appearance: She is well-developed. She is obese.  HENT:     Head: Normocephalic.     Right Ear: External ear normal.     Left Ear: External ear normal.     Nose: Nose normal.  Eyes:     General:        Right eye: No discharge.        Left eye: No discharge.     Conjunctiva/sclera: Conjunctivae normal.     Pupils: Pupils are equal, round, and reactive to light.  Neck:     Thyroid : No thyromegaly.     Vascular: No JVD.     Trachea: No tracheal deviation.  Cardiovascular:     Rate and Rhythm: Normal rate and regular rhythm.     Heart sounds: Normal heart sounds.  Pulmonary:     Effort: No respiratory distress.     Breath sounds: No stridor. No wheezing.  Abdominal:     General: Bowel sounds are normal. There is no distension.     Palpations: Abdomen is soft. There is no mass.     Tenderness: There is no abdominal tenderness. There is no guarding or rebound.  Musculoskeletal:        General: No tenderness.     Cervical back: Normal range of motion and neck supple. No rigidity.     Right lower leg: No edema.     Left lower leg: No edema.  Lymphadenopathy:     Cervical: No cervical adenopathy.  Skin:    Findings: No erythema or rash.  Neurological:     Mental Status: She is oriented to person, place, and time.     Cranial Nerves: No cranial nerve deficit.     Motor: No abnormal muscle tone.     Coordination: Coordination normal.     Gait: Gait abnormal.     Deep Tendon Reflexes: Reflexes normal.  Psychiatric:        Behavior: Behavior normal.        Thought Content: Thought content normal.        Judgment: Judgment normal.     Lab Results  Component Value Date   WBC 9.1 03/08/2024   HGB 11.2 (L) 03/08/2024   HCT 34.7 (L) 03/08/2024   PLT 360 03/08/2024    GLUCOSE 122 (H) 03/08/2024   CHOL 146 08/26/2023   TRIG 97.0 08/26/2023   HDL 53.60 08/26/2023   LDLCALC 73 08/26/2023   ALT 27 03/08/2024  AST 22 03/08/2024   NA 135 03/08/2024   K 3.6 03/08/2024   CL 103 03/08/2024   CREATININE 0.58 03/08/2024   BUN 15 03/08/2024   CO2 25 03/08/2024   TSH 2.22 08/26/2023   HGBA1C 6.5 08/26/2023    No results found.  Assessment & Plan:   Problem List Items Addressed This Visit     ADHD (attention deficit hyperactivity disorder) - Primary   She is on Adderall prescribed by her psychiatrist      Diabetes mellitus type 2, controlled (HCC)   Monitor A1c On Ozempic  to 2 mg/wk  Wt Readings from Last 3 Encounters:  08/23/24 218 lb 12.8 oz (99.2 kg)  05/23/24 210 lb (95.3 kg)  02/21/24 235 lb (106.6 kg)         GERD (gastroesophageal reflux disease)   Worse Start Protonix  40 mg/d      Relevant Medications   pantoprazole  (PROTONIX ) 40 MG tablet   Migraine, unspecified, not intractable, without status migrainosus   Triptan intolerant Try Nurtec OTC      Relevant Medications   Rimegepant Sulfate (NURTEC) 75 MG TBDP      Meds ordered this encounter  Medications   pantoprazole  (PROTONIX ) 40 MG tablet    Sig: Take 1 tablet (40 mg total) by mouth daily.    Dispense:  90 tablet    Refill:  3   Rimegepant Sulfate (NURTEC) 75 MG TBDP    Sig: 1 po qod prn    Dispense:  15 tablet    Refill:  5      Follow-up: Return in about 3 months (around 11/22/2024) for a follow-up visit.  Marolyn Noel, MD

## 2024-08-23 NOTE — Assessment & Plan Note (Signed)
 Triptan intolerant Try Nurtec OTC

## 2024-08-23 NOTE — Assessment & Plan Note (Signed)
 Monitor A1c On Ozempic  to 2 mg/wk  Wt Readings from Last 3 Encounters:  08/23/24 218 lb 12.8 oz (99.2 kg)  05/23/24 210 lb (95.3 kg)  02/21/24 235 lb (106.6 kg)

## 2024-08-23 NOTE — Assessment & Plan Note (Addendum)
 She is on Adderall prescribed by her psychiatrist

## 2024-08-29 NOTE — Telephone Encounter (Signed)
 Copied from CRM [id number]. Topic: General - Other >> Aug 29, 2024  2:39 PM Thersia C wrote: Reason for CRM: Patient called in regarding paperwork for surgery stated deadline is tomorrow, and stated she also wanted to know if she could get an EKG today or tomorrow because that is something she needs to have before her surgery as well, would like a callback at   [phone]

## 2024-08-30 ENCOUNTER — Ambulatory Visit (HOSPITAL_COMMUNITY): Payer: Self-pay

## 2024-09-07 ENCOUNTER — Encounter: Admitting: Internal Medicine

## 2024-09-19 ENCOUNTER — Encounter: Admitting: Internal Medicine

## 2024-09-25 ENCOUNTER — Ambulatory Visit (INDEPENDENT_AMBULATORY_CARE_PROVIDER_SITE_OTHER)

## 2024-09-25 VITALS — Ht 66.0 in | Wt 208.0 lb

## 2024-09-25 DIAGNOSIS — Z0000 Encounter for general adult medical examination without abnormal findings: Secondary | ICD-10-CM | POA: Diagnosis not present

## 2024-09-25 NOTE — Patient Instructions (Addendum)
 [name],  Thank you for taking the time for your Medicare Wellness Visit. I appreciate your continued commitment to your health goals. Please review the care plan we discussed, and feel free to reach out if I can assist you further.  Medicare recommends these wellness visits once per year to help you and your care team stay ahead of potential health issues. These visits are designed to focus on prevention, allowing your provider to concentrate on managing your acute and chronic conditions during your regular appointments.  Please note that Annual Wellness Visits do not include a physical exam. Some assessments may be limited, especially if the visit was conducted virtually. If needed, we may recommend a separate in-person follow-up with your provider.  Ongoing Care Seeing your primary care provider every 3 to 6 months helps us  monitor your health and provide consistent, personalized care.   Referrals If a referral was made during today's visit and you haven't received any updates within two weeks, please contact the referred provider directly to check on the status.  Recommended Screenings:  Health Maintenance  Topic Date Due   Complete foot exam   Never done   Yearly kidney health urinalysis for diabetes  Never done   DTaP/Tdap/Td vaccine (1 - Tdap) Never done   Hepatitis B Vaccine (1 of 3 - 19+ 3-dose series) Never done   HPV Vaccine (1 - Risk 3-dose SCDM series) Never done   Pap with HPV screening  04/20/2014   Pneumococcal Vaccine (2 of 2 - PCV) 09/15/2019   Hemoglobin A1C  02/23/2024   Flu Shot  07/07/2024   COVID-19 Vaccine (2 - 2025-26 season) 08/07/2024   Eye exam for diabetics  10/18/2024   Yearly kidney function blood test for diabetes  03/08/2025   Medicare Annual Wellness Visit  09/25/2025   Breast Cancer Screening  11/15/2025   Hepatitis C Screening  Completed   HIV Screening  Completed   Meningitis B Vaccine  Aged Out       09/25/2024   12:37 PM  Advanced  Directives  Does Patient Have a Medical Advance Directive? Yes  Type of Estate agent of [place];Living will  Does patient want to make changes to medical advance directive? No - Patient declined  Copy of Healthcare Power of Attorney in Chart? Yes - validated most recent copy scanned in chart (See row information)   Advance Care Planning is important because it: Ensures you receive medical care that aligns with your values, goals, and preferences. Provides guidance to your family and loved ones, reducing the emotional burden of decision-making during critical moments.  Vision: Annual vision screenings are recommended for early detection of glaucoma, cataracts, and diabetic retinopathy. These exams can also reveal signs of chronic conditions such as diabetes and high blood pressure.  Dental: Annual dental screenings help detect early signs of oral cancer, gum disease, and other conditions linked to overall health, including heart disease and diabetes.

## 2024-09-25 NOTE — Progress Notes (Signed)
 Subjective:  Please attest and cosign this visit due to patients primary care provider not being in the office at the time the visit was completed.  (Pt of Dr A. Plotnikov)   [name] is a 43 y.o. who presents for a Medicare Wellness preventive visit.  As a reminder, Annual Wellness Visits don't include a physical exam, and some assessments may be limited, especially if this visit is performed virtually. We may recommend an in-person follow-up visit with your provider if needed.  Visit Complete: Virtual I connected with  [name] on 09/25/24 by a audio enabled telemedicine application and verified that I am speaking with the correct person using two identifiers.  Patient Location: Home  Provider Location: Office/Clinic  I discussed the limitations of evaluation and management by telemedicine. The patient expressed understanding and agreed to proceed.  Vital Signs: Because this visit was a virtual/telehealth visit, some criteria may be missing or patient reported. Any vitals not documented were not able to be obtained and vitals that have been documented are patient reported.  VideoDeclined- This patient declined Librarian, academic. Therefore the visit was completed with audio only.  Persons Participating in Visit: Patient.  AWV Questionnaire: No: Patient Medicare AWV questionnaire was not completed prior to this visit.  Cardiac Risk Factors include: advanced age (>68men, >70 women);diabetes mellitus;obesity (BMI >30kg/m2)     Objective:    Today's Vitals   09/25/24 1237  Weight: 208 lb (94.3 kg)  Height: 5' 6 (1.676 m)   Body mass index is 33.57 kg/m.     09/25/2024   12:37 PM 03/08/2024    6:28 AM 11/02/2023   11:24 AM 07/01/2023    2:04 PM 06/29/2023   11:48 PM 01/29/2021    8:39 PM 10/03/2020    6:18 AM  Advanced Directives  Does Patient Have a Medical Advance Directive? Yes No Yes  No No No  Type of Special educational needs teacher of [place];Living will  Healthcare Power of [place];Living will      Does patient want to make changes to medical advance directive? No - Patient declined  No - Patient declined      Copy of Healthcare Power of Attorney in Chart? Yes - validated most recent copy scanned in chart (See row information)  Yes - validated most recent copy scanned in chart (See row information)      Would patient like information on creating a medical advance directive?            Information is confidential and restricted. Go to Review Flowsheets to unlock data.    Current Medications (verified) Outpatient Encounter Medications as of 09/25/2024  Medication Sig   amLODipine  (NORVASC ) 5 MG tablet Take 1 tablet (5 mg total) by mouth daily.   amphetamine-dextroamphetamine (ADDERALL) 15 MG tablet Take 15 mg by mouth 2 (two) times daily.   Cholecalciferol (VITAMIN D3) 50 MCG (2000 UT) capsule Take 1 capsule (2,000 Units total) by mouth daily.   cloNIDine  (CATAPRES ) 0.1 MG tablet TAKE 1 TABLET BY MOUTH 2 TIMES DAILY.   gabapentin  (NEURONTIN ) 300 MG capsule Take 1 capsule (300 mg total) by mouth 3 (three) times daily.   hydrALAZINE  (APRESOLINE ) 50 MG tablet TAKE 1 TABLET BY MOUTH THREE TIMES A DAY   hydrOXYzine  (ATARAX ) 50 MG tablet Take 50 mg by mouth every 8 (eight) hours as needed.   lamoTRIgine  (LAMICTAL ) 150 MG tablet Take 150 mg by mouth daily.   meloxicam  (MOBIC ) 15 MG  tablet TAKE 1 TABLET BY MOUTH EVERY DAY AS NEEDED FOR PAIN   metFORMIN  (GLUCOPHAGE ) 500 MG tablet TAKE 1 TABLET BY MOUTH EVERY DAY WITH BREAKFAST   methadone  (DOLOPHINE ) 10 MG tablet Take 9 tablets (90 mg total) by mouth daily.   Multiple Vitamins-Minerals (CENTRUM ADULTS) TABS 1 po qd   Oxcarbazepine  (TRILEPTAL ) 300 MG tablet Take 1 tablet (300 mg total) by mouth 2 (two) times daily.   pantoprazole  (PROTONIX ) 40 MG tablet Take 1 tablet (40 mg total) by mouth daily.   Rimegepant Sulfate (NURTEC) 75 MG TBDP 1 po qod prn    Semaglutide , 2 MG/DOSE, (OZEMPIC , 2 MG/DOSE,) 8 MG/3ML SOPN INJECT 2 MG AS DIRECTED ONCE A WEEK.   senna-docusate (SENOKOT-S) 8.6-50 MG tablet Take 1 tablet by mouth 2 (two) times daily as needed for moderate constipation.   traZODone  (DESYREL ) 100 MG tablet Take 1 tablet (100 mg total) by mouth at bedtime.   triamcinolone  cream (KENALOG ) 0.1 % Apply 1 Application topically 2 (two) times daily.   No facility-administered encounter medications on file as of 09/25/2024.    Allergies (verified) Shellfish allergy, Meat extract, and Sumatriptan    History: Past Medical History:  Diagnosis Date   ADHD (attention deficit hyperactivity disorder)    Alcohol abuse    Allergy 2015   Shellfish   Anxiety    Arthritis 2020   Both knees   Bipolar 1 disorder (HCC)    Borderline systolic HTN    Depression    Diabetes mellitus type 2, controlled (HCC) 07/19/2023   2024  Start Metformin   The patient was referred to diabetic teaching     GERD (gastroesophageal reflux disease)    Heroin abuse (HCC)    Hypertension    Kidney stone    Knee pain, bilateral    - patellofemoral syndrome, followed by Dr. Tess at [facility]   Migraines    Migraines    Polysubstance abuse (HCC)    - Hx ETOH, cocaine, THC, IV heroin, narcotics   Psychiatric care    Previous [facility] H&P notes PMHx Schizophrenia, though patient denies this.   Seizures (HCC)    Substance abuse (HCC) 2000   In recovery for opioid use disorder   Past Surgical History:  Procedure Laterality Date   FRACTURE SURGERY  2006   AC separation   KNEE SURGERY     SHOULDER SURGERY     Family History  Problem Relation Age of Onset   Hypertension Father    Heart disease Father    Stroke Father    Anxiety disorder Mother    Arthritis Mother    Asthma Mother    Cancer Mother    Hypertension Mother    Alcohol abuse Brother    Anxiety disorder Brother    Social History   Socioeconomic History   Marital status: Single    Spouse name: Not on file    Number of children: Not on file   Years of education: Not on file   Highest education level: Not on file  Occupational History   Occupation: Disabled  Tobacco Use   Smoking status: Former    Current packs/day: 0.50    Types: Cigarettes   Smokeless tobacco: Never  Vaping Use   Vaping status: Never Used  Substance and Sexual Activity   Alcohol use: No   Drug use: No    Types: IV    Comment: methadone     Sexual activity: Not Currently    Birth control/protection: None  Other Topics  Concern   Not on file  Social History Narrative   Lives with her dad.   Social Drivers of Corporate investment banker Strain: Low Risk  (09/25/2024)   Overall Financial Resource Strain (CARDIA)    Difficulty of Paying Living Expenses: Not hard at all  Food Insecurity: No Food Insecurity (09/25/2024)   Hunger Vital Sign    Worried About Running Out of Food in the Last Year: Never true    Ran Out of Food in the Last Year: Never true  Transportation Needs: No Transportation Needs (09/25/2024)   PRAPARE - Administrator, Civil Service (Medical): No    Lack of Transportation (Non-Medical): No  Physical Activity: Inactive (09/25/2024)   Exercise Vital Sign    Days of Exercise per Week: 0 days    Minutes of Exercise per Session: 0 min  Stress: No Stress Concern Present (09/25/2024)   [employer] of Occupational Health - Occupational Stress Questionnaire    Feeling of Stress: Only a little  Social Connections: Moderately Isolated (09/25/2024)   Social Connection and Isolation Panel    Frequency of Communication with Friends and Family: Once a week    Frequency of Social Gatherings with Friends and Family: More than three times a week    Attends Religious Services: More than 4 times per year    Active Member of [employer] or Organizations: No    Attends Engineer, structural: Never    Marital Status: Never married    Tobacco Counseling Counseling given: Not  Answered    Clinical Intake:  Pre-visit preparation completed: Yes  Pain : No/denies pain     BMI - recorded: 33.57 Nutritional Status: BMI > 30  Obese Nutritional Risks: None Diabetes: Yes CBG done?: No Did pt. bring in CBG monitor from home?: No  Lab Results  Component Value Date   HGBA1C 6.5 08/26/2023   HGBA1C 7.5 (H) 07/02/2023   HGBA1C 5.4 09/04/2022     How often do you need to have someone help you when you read instructions, pamphlets, or other written materials from your doctor or pharmacy?: 1 - Never  Interpreter Needed?: No  Information entered by :: Verdie Saba, CMA   Activities of Daily Living     09/25/2024   12:42 PM 11/02/2023   11:33 AM  In your present state of health, do you have any difficulty performing the following activities:  Hearing? 0 0  Vision? 0 0  Difficulty concentrating or making decisions? 0 0  Walking or climbing stairs? 0 0  Dressing or bathing? 0 0  Doing errands, shopping? 0 0  Preparing Food and eating ? N N  Using the Toilet? N N  In the past six months, have you accidently leaked urine? N N  Do you have problems with loss of bowel control? N N  Managing your Medications? N N  Managing your Finances? N N  Housekeeping or managing your Housekeeping? N N    Patient Care Team: Plotnikov, Karlynn GAILS, MD as PCP - General (Internal Medicine) Octavia, Charlie Hamilton, MD as Consulting Physician (Ophthalmology)  I have updated your Care Teams any recent Medical Services you may have received from other providers in the past year.     Assessment:   This is a routine wellness examination for [name].  Hearing/Vision screen Hearing Screening - Comments:: Denies hearing difficulties   Vision Screening - Comments:: Wears rx glasses - up to date with routine eye exams with Charlie Hamilton  [place]   Goals Addressed               This Visit's Progress     Patient Stated (pt-stated)        Patient stated she plans to continue  to take meds daily       Depression Screen     09/25/2024   12:44 PM 02/21/2024    2:01 PM 11/02/2023   11:41 AM 09/20/2023    2:56 PM 09/20/2023    2:43 PM 09/26/2021    6:21 AM 01/13/2019    1:34 PM  PHQ 2/9 Scores  PHQ - 2 Score 0 2 0 0 0 0   PHQ- 9 Score 0 6 1 3   0      Information is confidential and restricted. Go to Review Flowsheets to unlock data.    Fall Risk     09/25/2024   12:43 PM 08/23/2024    2:37 PM 02/21/2024    2:00 PM 11/22/2023    3:46 PM 11/02/2023   11:37 AM  Fall Risk   Falls in the past year? 1 0 0 0 0  Number falls in past yr: 1 0 0 0 0  Comment >2 - due to episodes      Injury with Fall? 0 0 0 0 0  Risk for fall due to : Mental status change;Other (Comment) No Fall Risks No Fall Risks No Fall Risks No Fall Risks  Risk for fall due to: Comment ADHA/Bipolar      Follow up Falls evaluation completed;Falls prevention discussed Falls evaluation completed Falls evaluation completed Falls evaluation completed Falls prevention discussed;Falls evaluation completed    MEDICARE RISK AT HOME:  Medicare Risk at Home Any stairs in or around the home?: No If so, are there any without handrails?: No Home free of loose throw rugs in walkways, pet beds, electrical cords, etc?: Yes Adequate lighting in your home to reduce risk of falls?: Yes Life alert?: No Use of a cane, walker or w/c?: No Grab bars in the bathroom?: No Shower chair or bench in shower?: No Elevated toilet seat or a handicapped toilet?: No  TIMED UP AND GO:  Was the test performed?  No  Cognitive Function: 6CIT completed        09/25/2024   12:46 PM 11/02/2023   11:38 AM  6CIT Screen  What Year? 0 points 0 points  What month? 0 points 0 points  What time? 0 points 0 points  Count back from 20 0 points 0 points  Months in reverse 0 points 0 points  Repeat phrase 0 points 0 points  Total Score 0 points 0 points    Immunizations Immunization History  Administered Date(s)  Administered   Influenza Inj Mdck Quad Pf 08/27/2013   Influenza,inj,Quad PF,6+ Mos 08/27/2013, 09/14/2018   Pfizer(Comirnaty)Fall Seasonal Vaccine 12 years and older 08/13/2023   Pneumococcal Polysaccharide-23 09/14/2018    Screening Tests Health Maintenance  Topic Date Due   FOOT EXAM  Never done   Diabetic kidney evaluation - Urine ACR  Never done   DTaP/Tdap/Td (1 - Tdap) Never done   Hepatitis B Vaccines 19-59 Average Risk (1 of 3 - 19+ 3-dose series) Never done   HPV VACCINES (1 - Risk 3-dose SCDM series) Never done   Cervical Cancer Screening (HPV/Pap Cotest)  04/20/2014   Pneumococcal Vaccine (2 of 2 - PCV) 09/15/2019   HEMOGLOBIN A1C  02/23/2024   Influenza Vaccine  07/07/2024   COVID-19 Vaccine (2 -  2025-26 season) 08/07/2024   OPHTHALMOLOGY EXAM  10/18/2024   Diabetic kidney evaluation - eGFR measurement  03/08/2025   Medicare Annual Wellness (AWV)  09/25/2025   Mammogram  11/15/2025   Hepatitis C Screening  Completed   HIV Screening  Completed   Meningococcal B Vaccine  Aged Out    Health Maintenance Items Addressed:  09/25/2024  Additional Screening:  Vision Screening: Recommended annual ophthalmology exams for early detection of glaucoma and other disorders of the eye. Is the patient up to date with their annual eye exam?  Yes  Who is the provider or what is the name of the office in which the patient attends annual eye exams? Richard Glendia Gaudy of [facility]  Dental Screening: Recommended annual dental exams for proper oral hygiene  Community Resource Referral / Chronic Care Management: CRR required this visit?  No   CCM required this visit?  No   Plan:    I have personally reviewed and noted the following in the patient's chart:   Medical and social history Use of alcohol, tobacco or illicit drugs  Current medications and supplements including opioid prescriptions. Patient is not currently taking opioid prescriptions. Functional ability and  status Nutritional status Physical activity Advanced directives List of other physicians Hospitalizations, surgeries, and ER visits in previous 12 months Vitals Screenings to include cognitive, depression, and falls Referrals and appointments  In addition, I have reviewed and discussed with patient certain preventive protocols, quality metrics, and best practice recommendations. A written personalized care plan for preventive services as well as general preventive health recommendations were provided to patient.   Verdie CHRISTELLA Saba, CMA   09/25/2024   After Visit Summary: (MyChart) Due to this being a telephonic visit, the after visit summary with patients personalized plan was offered to patient via MyChart   Notes: Scheduled a 1-yr Physical w/PCP for 09/29/2024

## 2024-09-29 ENCOUNTER — Encounter: Payer: Self-pay | Admitting: Internal Medicine

## 2024-09-29 ENCOUNTER — Ambulatory Visit (INDEPENDENT_AMBULATORY_CARE_PROVIDER_SITE_OTHER): Admitting: Internal Medicine

## 2024-09-29 VITALS — BP 114/76 | HR 84 | Temp 98.2°F | Ht 66.0 in | Wt 219.4 lb

## 2024-09-29 DIAGNOSIS — R9431 Abnormal electrocardiogram [ECG] [EKG]: Secondary | ICD-10-CM | POA: Diagnosis not present

## 2024-09-29 DIAGNOSIS — E119 Type 2 diabetes mellitus without complications: Secondary | ICD-10-CM

## 2024-09-29 DIAGNOSIS — Z01818 Encounter for other preprocedural examination: Secondary | ICD-10-CM

## 2024-09-29 MED ORDER — OZEMPIC (1 MG/DOSE) 2 MG/1.5ML ~~LOC~~ SOPN: 1.0000 mg | PEN_INJECTOR | SUBCUTANEOUS | 0 refills | Status: AC

## 2024-09-29 MED ORDER — OZEMPIC (2 MG/DOSE) 8 MG/3ML ~~LOC~~ SOPN: PEN_INJECTOR | SUBCUTANEOUS | 5 refills | Status: AC

## 2024-09-29 NOTE — Progress Notes (Signed)
 Subjective:  Patient ID: [name], female    DOB: [birth year]  Age: 43 y.o. MRN: [id number]  CC: Annual Exam (Annual Exam)   HPI Dawne Casali Parke presents for a pre-op exam Planning to have liposuction (Dec 2nd 2025) Dr Shannon in [place] - needs an EKG  Outpatient Medications Prior to Visit  Medication Sig Dispense Refill   amLODipine  (NORVASC ) 5 MG tablet Take 1 tablet (5 mg total) by mouth daily. 90 tablet 3   Cholecalciferol (VITAMIN D3) 50 MCG (2000 UT) capsule Take 1 capsule (2,000 Units total) by mouth daily. 100 capsule 3   cloNIDine  (CATAPRES ) 0.1 MG tablet TAKE 1 TABLET BY MOUTH 2 TIMES DAILY. 180 tablet 1   gabapentin  (NEURONTIN ) 300 MG capsule Take 1 capsule (300 mg total) by mouth 3 (three) times daily. 90 capsule 0   hydrALAZINE  (APRESOLINE ) 50 MG tablet TAKE 1 TABLET BY MOUTH THREE TIMES A DAY 90 tablet 3   hydrOXYzine  (ATARAX ) 50 MG tablet Take 50 mg by mouth every 8 (eight) hours as needed.     lamoTRIgine  (LAMICTAL ) 150 MG tablet Take 150 mg by mouth daily.     meloxicam  (MOBIC ) 15 MG tablet TAKE 1 TABLET BY MOUTH EVERY DAY AS NEEDED FOR PAIN 30 tablet 3   metFORMIN  (GLUCOPHAGE ) 500 MG tablet TAKE 1 TABLET BY MOUTH EVERY DAY WITH BREAKFAST 90 tablet 3   Multiple Vitamins-Minerals (CENTRUM ADULTS) TABS 1 po qd 100 tablet 3   Oxcarbazepine  (TRILEPTAL ) 300 MG tablet Take 1 tablet (300 mg total) by mouth 2 (two) times daily. 60 tablet 0   pantoprazole  (PROTONIX ) 40 MG tablet Take 1 tablet (40 mg total) by mouth daily. 90 tablet 3   Rimegepant Sulfate (NURTEC) 75 MG TBDP 1 po qod prn 15 tablet 5   senna-docusate (SENOKOT-S) 8.6-50 MG tablet Take 1 tablet by mouth 2 (two) times daily as needed for moderate constipation. 100 tablet 3   traZODone  (DESYREL ) 100 MG tablet Take 1 tablet (100 mg total) by mouth at bedtime. 30 tablet 0   triamcinolone  cream (KENALOG ) 0.1 % Apply 1 Application topically 2 (two) times daily. 454 g 0   amphetamine-dextroamphetamine  (ADDERALL) 15 MG tablet Take 15 mg by mouth 2 (two) times daily.     methadone  (DOLOPHINE ) 10 MG tablet Take 9 tablets (90 mg total) by mouth daily.     Semaglutide , 2 MG/DOSE, (OZEMPIC , 2 MG/DOSE,) 8 MG/3ML SOPN INJECT 2 MG AS DIRECTED ONCE A WEEK. 3 mL 5   No facility-administered medications prior to visit.    ROS: Review of Systems  Constitutional:  Negative for activity change, appetite change, chills, fatigue and unexpected weight change.  HENT:  Negative for congestion, mouth sores and sinus pressure.   Eyes:  Negative for visual disturbance.  Respiratory:  Negative for cough, chest tightness, shortness of breath and wheezing.   Cardiovascular:  Negative for chest pain, palpitations and leg swelling.  Gastrointestinal:  Negative for abdominal pain and nausea.  Genitourinary:  Negative for difficulty urinating, frequency and vaginal pain.  Musculoskeletal:  Negative for back pain and gait problem.  Skin:  Negative for pallor and rash.  Neurological:  Negative for dizziness, tremors, syncope, weakness, light-headedness, numbness and headaches.  Psychiatric/Behavioral:  Positive for dysphoric mood. Negative for confusion and sleep disturbance. The patient is nervous/anxious.     Objective:  BP 114/76   Pulse 84   Temp 98.2 F (36.8 C)   Ht 5' 6 (1.676 m)   Wt 219  lb 6.4 oz (99.5 kg)   SpO2 94%   BMI 35.41 kg/m   BP Readings from Last 3 Encounters:  09/29/24 114/76  08/23/24 130/84  05/23/24 120/80    Wt Readings from Last 3 Encounters:  09/29/24 219 lb 6.4 oz (99.5 kg)  09/25/24 208 lb (94.3 kg)  08/23/24 218 lb 12.8 oz (99.2 kg)    Physical Exam Constitutional:      General: She is not in acute distress.    Appearance: She is well-developed. She is obese.  HENT:     Head: Normocephalic.     Right Ear: External ear normal.     Left Ear: External ear normal.     Nose: Nose normal.  Eyes:     General:        Right eye: No discharge.        Left eye: No  discharge.     Conjunctiva/sclera: Conjunctivae normal.     Pupils: Pupils are equal, round, and reactive to light.  Neck:     Thyroid : No thyromegaly.     Vascular: No JVD.     Trachea: No tracheal deviation.  Cardiovascular:     Rate and Rhythm: Regular rhythm. Tachycardia present.     Heart sounds: Normal heart sounds.  Pulmonary:     Effort: No respiratory distress.     Breath sounds: No stridor. No wheezing.  Abdominal:     General: Bowel sounds are normal. There is no distension.     Palpations: Abdomen is soft. There is no mass.     Tenderness: There is no abdominal tenderness. There is no guarding or rebound.  Musculoskeletal:        General: No tenderness.     Cervical back: Normal range of motion and neck supple. No rigidity.     Right lower leg: No edema.     Left lower leg: No edema.  Lymphadenopathy:     Cervical: No cervical adenopathy.  Skin:    Findings: No erythema or rash.  Neurological:     Mental Status: Mental status is at baseline.     Cranial Nerves: No cranial nerve deficit.     Motor: No abnormal muscle tone.     Coordination: Coordination normal.     Deep Tendon Reflexes: Reflexes normal.  Psychiatric:        Behavior: Behavior normal.        Thought Content: Thought content normal.        Judgment: Judgment normal.   Mild tachycardia  Procedure: EKG Indication: preop exam Impression: NSR. New prolonged QT 442/497 ms  Lab Results  Component Value Date   WBC 9.1 03/08/2024   HGB 11.2 (L) 03/08/2024   HCT 34.7 (L) 03/08/2024   PLT 360 03/08/2024   GLUCOSE 122 (H) 03/08/2024   CHOL 146 08/26/2023   TRIG 97.0 08/26/2023   HDL 53.60 08/26/2023   LDLCALC 73 08/26/2023   ALT 27 03/08/2024   AST 22 03/08/2024   NA 135 03/08/2024   K 3.6 03/08/2024   CL 103 03/08/2024   CREATININE 0.58 03/08/2024   BUN 15 03/08/2024   CO2 25 03/08/2024   TSH 2.22 08/26/2023   HGBA1C 6.5 08/26/2023    No results found.    A total time of 45 minutes  was spent preparing to see the patient, reviewing tests, x-ray reports, EKGs and other medical records.  Also, obtaining history and performing comprehensive physical exam.  Additionally, counseling the patient regarding the above  listed issues-prolonged QT.   Finally, documenting clinical information in the health records, coordination of care, educating the patient.   Assessment & Plan:   Problem List Items Addressed This Visit     Diabetes mellitus type 2, controlled (HCC)   Monitor A1c On Ozempic  to 2 mg/wk Hold Ozempic  two week prior to surgery. Restart w/1 mg/wk a weeks or 2 after  Wt Readings from Last 3 Encounters:  09/29/24 219 lb 6.4 oz (99.5 kg)  09/25/24 208 lb (94.3 kg)  08/23/24 218 lb 12.8 oz (99.2 kg)         Relevant Medications   Semaglutide , 1 MG/DOSE, (OZEMPIC , 1 MG/DOSE,) 2 MG/1.5ML SOPN   Semaglutide , 2 MG/DOSE, (OZEMPIC , 2 MG/DOSE,) 8 MG/3ML SOPN   Preop exam for internal medicine   Planning to have liposuction (Dec 2nd 2025) - needs an EKG. [facility] at [facility] pending New prolonged QT 442/497 ms - possibly due to methadone  and/or adderall vs other Will obtain Cardiology consult to clear [facility] for surgery Clear for surgery assuming labs are ok and Cardiology clears her      Relevant Orders   Ambulatory referral to Cardiology   Prolonged Q-T interval on ECG - Primary   New prolonged QT 442/497 ms - possibly due to methadone  and/or adderall vs other Will obtain Cardiology consult to clear [facility] for surgery      Relevant Orders   Ambulatory referral to Cardiology      Meds ordered this encounter  Medications   Semaglutide , 1 MG/DOSE, (OZEMPIC , 1 MG/DOSE,) 2 MG/1.5ML SOPN    Sig: Inject 1 mg into the skin once a week. Use 1 mg sq weekly. Can titrate up to 2 mg weekly if tolerated    Dispense:  2 mL    Refill:  0    Re-start post-op   Semaglutide , 2 MG/DOSE, (OZEMPIC , 2 MG/DOSE,) 8 MG/3ML SOPN    Sig: 2 mg sq q 1 week    Dispense:  3 mL    Refill:   5      Follow-up: Return in about 3 months (around 12/30/2024) for a follow-up visit.  Marolyn Noel, MD

## 2024-09-29 NOTE — Assessment & Plan Note (Addendum)
 New prolonged QT 442/497 ms - possibly due to methadone  and/or adderall vs other.  No symptoms Will obtain Cardiology consult to clear [facility] for surgery

## 2024-09-29 NOTE — Assessment & Plan Note (Addendum)
 Planning to have liposuction (Dec 2nd 2025) - needs an EKG. Labs at [facility] pending New prolonged QT 442/497 ms - possibly due to methadone  and/or adderall vs other Will obtain Cardiology consult to clear [facility] for surgery Clear for surgery assuming labs are ok and Cardiology clears her for prolonged QT

## 2024-09-29 NOTE — Assessment & Plan Note (Signed)
 Monitor A1c On Ozempic  to 2 mg/wk Hold Ozempic  two week prior to surgery. Restart w/1 mg/wk a weeks or 2 after  Wt Readings from Last 3 Encounters:  09/29/24 219 lb 6.4 oz (99.5 kg)  09/25/24 208 lb (94.3 kg)  08/23/24 218 lb 12.8 oz (99.2 kg)

## 2024-10-02 NOTE — Progress Notes (Incomplete)
 Subjective:  Patient ID: [name], female    DOB: [birth year]  Age: 43 y.o. MRN: [id number]  CC: Annual Exam (Annual Exam)   HPI [name] presents for a well exam, pre-op exam Planning to have liposuction (Dec 2nd 2025) Dr Shannon in [place] - needs an EKG  Outpatient Medications Prior to Visit  Medication Sig Dispense Refill  . amLODipine  (NORVASC ) 5 MG tablet Take 1 tablet (5 mg total) by mouth daily. 90 tablet 3  . Cholecalciferol (VITAMIN D3) 50 MCG (2000 UT) capsule Take 1 capsule (2,000 Units total) by mouth daily. 100 capsule 3  . cloNIDine  (CATAPRES ) 0.1 MG tablet TAKE 1 TABLET BY MOUTH 2 TIMES DAILY. 180 tablet 1  . gabapentin  (NEURONTIN ) 300 MG capsule Take 1 capsule (300 mg total) by mouth 3 (three) times daily. 90 capsule 0  . hydrALAZINE  (APRESOLINE ) 50 MG tablet TAKE 1 TABLET BY MOUTH THREE TIMES A DAY 90 tablet 3  . hydrOXYzine  (ATARAX ) 50 MG tablet Take 50 mg by mouth every 8 (eight) hours as needed.    . lamoTRIgine  (LAMICTAL ) 150 MG tablet Take 150 mg by mouth daily.    . meloxicam  (MOBIC ) 15 MG tablet TAKE 1 TABLET BY MOUTH EVERY DAY AS NEEDED FOR PAIN 30 tablet 3  . metFORMIN  (GLUCOPHAGE ) 500 MG tablet TAKE 1 TABLET BY MOUTH EVERY DAY WITH BREAKFAST 90 tablet 3  . Multiple Vitamins-Minerals (CENTRUM ADULTS) TABS 1 po qd 100 tablet 3  . Oxcarbazepine  (TRILEPTAL ) 300 MG tablet Take 1 tablet (300 mg total) by mouth 2 (two) times daily. 60 tablet 0  . pantoprazole  (PROTONIX ) 40 MG tablet Take 1 tablet (40 mg total) by mouth daily. 90 tablet 3  . Rimegepant Sulfate (NURTEC) 75 MG TBDP 1 po qod prn 15 tablet 5  . senna-docusate (SENOKOT-S) 8.6-50 MG tablet Take 1 tablet by mouth 2 (two) times daily as needed for moderate constipation. 100 tablet 3  . traZODone  (DESYREL ) 100 MG tablet Take 1 tablet (100 mg total) by mouth at bedtime. 30 tablet 0  . triamcinolone  cream (KENALOG ) 0.1 % Apply 1 Application topically 2 (two) times daily. 454 g 0  .  amphetamine-dextroamphetamine (ADDERALL) 15 MG tablet Take 15 mg by mouth 2 (two) times daily.    . methadone  (DOLOPHINE ) 10 MG tablet Take 9 tablets (90 mg total) by mouth daily.    . Semaglutide , 2 MG/DOSE, (OZEMPIC , 2 MG/DOSE,) 8 MG/3ML SOPN INJECT 2 MG AS DIRECTED ONCE A WEEK. 3 mL 5   No facility-administered medications prior to visit.    ROS: Review of Systems  Constitutional:  Negative for activity change, appetite change, chills, fatigue and unexpected weight change.  HENT:  Negative for congestion, mouth sores and sinus pressure.   Eyes:  Negative for visual disturbance.  Respiratory:  Negative for cough, chest tightness, shortness of breath and wheezing.   Cardiovascular:  Negative for chest pain, palpitations and leg swelling.  Gastrointestinal:  Negative for abdominal pain and nausea.  Genitourinary:  Negative for difficulty urinating, frequency and vaginal pain.  Musculoskeletal:  Negative for back pain and gait problem.  Skin:  Negative for pallor and rash.  Neurological:  Negative for dizziness, tremors, syncope, weakness, light-headedness, numbness and headaches.  Psychiatric/Behavioral:  Positive for dysphoric mood. Negative for confusion and sleep disturbance. The patient is nervous/anxious.     Objective:  BP 114/76   Pulse 84   Temp 98.2 F (36.8 C)   Ht 5' 6 (1.676 m)  Wt 219 lb 6.4 oz (99.5 kg)   SpO2 94%   BMI 35.41 kg/m   BP Readings from Last 3 Encounters:  09/29/24 114/76  08/23/24 130/84  05/23/24 120/80    Wt Readings from Last 3 Encounters:  09/29/24 219 lb 6.4 oz (99.5 kg)  09/25/24 208 lb (94.3 kg)  08/23/24 218 lb 12.8 oz (99.2 kg)    Physical Exam Constitutional:      General: She is not in acute distress.    Appearance: She is well-developed. She is obese.  HENT:     Head: Normocephalic.     Right Ear: External ear normal.     Left Ear: External ear normal.     Nose: Nose normal.  Eyes:     General:        Right eye: No  discharge.        Left eye: No discharge.     Conjunctiva/sclera: Conjunctivae normal.     Pupils: Pupils are equal, round, and reactive to light.  Neck:     Thyroid : No thyromegaly.     Vascular: No JVD.     Trachea: No tracheal deviation.  Cardiovascular:     Rate and Rhythm: Regular rhythm. Tachycardia present.     Heart sounds: Normal heart sounds.  Pulmonary:     Effort: No respiratory distress.     Breath sounds: No stridor. No wheezing.  Abdominal:     General: Bowel sounds are normal. There is no distension.     Palpations: Abdomen is soft. There is no mass.     Tenderness: There is no abdominal tenderness. There is no guarding or rebound.  Musculoskeletal:        General: No tenderness.     Cervical back: Normal range of motion and neck supple. No rigidity.     Right lower leg: No edema.     Left lower leg: No edema.  Lymphadenopathy:     Cervical: No cervical adenopathy.  Skin:    Findings: No erythema or rash.  Neurological:     Mental Status: Mental status is at baseline.     Cranial Nerves: No cranial nerve deficit.     Motor: No abnormal muscle tone.     Coordination: Coordination normal.     Deep Tendon Reflexes: Reflexes normal.  Psychiatric:        Behavior: Behavior normal.        Thought Content: Thought content normal.        Judgment: Judgment normal.   Mild tachycardia  Procedure: EKG Indication: preop exam Impression: NSR. New prolonged QT 442/497 ms  Lab Results  Component Value Date   WBC 9.1 03/08/2024   HGB 11.2 (L) 03/08/2024   HCT 34.7 (L) 03/08/2024   PLT 360 03/08/2024   GLUCOSE 122 (H) 03/08/2024   CHOL 146 08/26/2023   TRIG 97.0 08/26/2023   HDL 53.60 08/26/2023   LDLCALC 73 08/26/2023   ALT 27 03/08/2024   AST 22 03/08/2024   NA 135 03/08/2024   K 3.6 03/08/2024   CL 103 03/08/2024   CREATININE 0.58 03/08/2024   BUN 15 03/08/2024   CO2 25 03/08/2024   TSH 2.22 08/26/2023   HGBA1C 6.5 08/26/2023    No results  found.  Assessment & Plan:   Problem List Items Addressed This Visit     Diabetes mellitus type 2, controlled (HCC)   Monitor A1c On Ozempic  to 2 mg/wk Hold Ozempic  two week prior to surgery. Restart w/1  mg/wk a weeks or 2 after  Wt Readings from Last 3 Encounters:  09/29/24 219 lb 6.4 oz (99.5 kg)  09/25/24 208 lb (94.3 kg)  08/23/24 218 lb 12.8 oz (99.2 kg)         Relevant Medications   Semaglutide , 1 MG/DOSE, (OZEMPIC , 1 MG/DOSE,) 2 MG/1.5ML SOPN   Semaglutide , 2 MG/DOSE, (OZEMPIC , 2 MG/DOSE,) 8 MG/3ML SOPN   Preop exam for internal medicine   Planning to have liposuction (Dec 2nd 2025) - needs an EKG. 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 Semaglutide , 1 MG/DOSE, (OZEMPIC , 1 MG/DOSE,) 2 MG/1.5ML SOPN    Sig: Inject 1 mg into the skin once a week. Use 1 mg sq weekly. Can titrate up to 2 mg weekly if tolerated    Dispense:  2 mL    Refill:  0    Re-start post-op  . Semaglutide , 2 MG/DOSE, (OZEMPIC , 2 MG/DOSE,) 8 MG/3ML SOPN    Sig: 2 mg sq q 1 week    Dispense:  3 mL    Refill:  5      Follow-up: Return in about 3 months (around 12/30/2024) for a follow-up visit.  Marolyn Noel, MD

## 2024-10-03 ENCOUNTER — Telehealth: Payer: Self-pay

## 2024-10-03 NOTE — Telephone Encounter (Unsigned)
 Copied from CRM [id number]. Topic: Clinical - Request for Lab/Test Order >> Oct 03, 2024 10:02 AM Robinson H wrote: Reason for CRM: Patient states she needs a repeat EKG done per surgeon since the most recent one done at office was abnormal, patient states she needs it done before surgery on 11/02.  Jesseka [phone]

## 2024-10-03 NOTE — Telephone Encounter (Signed)
 Copied from CRM [id number]. Topic: Medical Record Request - Other >> Oct 03, 2024 10:00 AM Robinson H wrote: Reason for CRM: Patient calling to see if she can pick up a copy of the surgical clearance paperwork that was completed for her at visit on 10/24.  Erryn [phone]

## 2024-10-04 NOTE — Telephone Encounter (Signed)
 The patient had a prolonged QT interval on the EKG. I referred her to see a cardiologist for a consultation regarding her prolonged QT and to clear her for surgery from cardiology standpoint. Thanks

## 2024-10-05 NOTE — Telephone Encounter (Signed)
 Tried calling the pt to inform her of PCP advice. I ldvm for pt and asked that she calls the office back for any concerns or questions.

## 2024-10-09 ENCOUNTER — Telehealth: Payer: Self-pay

## 2024-10-09 ENCOUNTER — Other Ambulatory Visit (HOSPITAL_COMMUNITY): Payer: Self-pay

## 2024-10-09 NOTE — Telephone Encounter (Signed)
 Pharmacy Patient Advocate Encounter   Received notification from CoverMyMeds that prior authorization for Nurtec 75mg  tabs is required/requested.   Insurance verification completed.   The patient is insured through [facility]   Per test claim: PA required; PA submitted to above mentioned insurance via Latent Key/confirmation #/EOC [id number] Status is pending

## 2024-10-09 NOTE — Telephone Encounter (Signed)
 Pharmacy Patient Advocate Encounter  Received notification from [facility] that Prior Authorization for Nurtec 75mg  tabs has been APPROVED from 10/09/24 to 12/06/25   PA #/Case ID/Reference #: [id number]

## 2024-10-10 ENCOUNTER — Telehealth: Payer: Self-pay

## 2024-10-10 NOTE — Telephone Encounter (Signed)
 Copied from CRM [id number]. Topic: Referral - Request for Referral >> Oct 10, 2024  8:59 AM Alexandria E wrote: Did the patient discuss referral with their provider in the last year? Yes (If No - schedule appointment) (If Yes - send message)  Appointment offered? No  Type of order/referral and detailed reason for visit: Gastroenterology. Patient stated that PCP is aware of why she needs to get seen by GI.  Preference of office, provider, location: [facility], fax number [phone]  If referral order, have you been seen by this specialty before? No (If Yes, this issue or another issue? When? Where?  Can we respond through MyChart? Yes, please call patient once referral is sent.

## 2024-10-10 NOTE — Telephone Encounter (Signed)
 She was referred to [facility] gastroenterology in June 2025.  The referral should be still active.  Information is in the chart.  Thanks

## 2024-10-10 NOTE — Telephone Encounter (Signed)
 Pharmacy Patient Advocate Encounter  Received notification from [facility] that Prior Authorization for Nurtec 75mg  tabs has been APPROVED from 10/09/24 to 12/06/25   PA #/Case ID/Reference #: [id number]

## 2024-10-11 NOTE — Telephone Encounter (Unsigned)
 Copied from CRM [id number]. Topic: Referral - Request for Referral >> Oct 10, 2024  8:59 AM Alexandria E wrote: Did the patient discuss referral with their provider in the last year? Yes (If No - schedule appointment) (If Yes - send message)  Appointment offered? No  Type of order/referral and detailed reason for visit: Gastroenterology. Patient stated that PCP is aware of why she needs to get seen by GI.  Preference of office, provider, location: [facility], fax number [phone]  If referral order, have you been seen by this specialty before? No (If Yes, this issue or another issue? When? Where?  Can we respond through MyChart? Yes, please call patient once referral is sent. >> Oct 11, 2024  4:00 PM Franky GRADE wrote: Patient is calling to follow up on the [facility] referral, she does not want to go to [facility] but would like to go to [facility], she provided me with the fax to send the referral [phone].

## 2024-11-06 ENCOUNTER — Other Ambulatory Visit: Payer: Self-pay | Admitting: Internal Medicine

## 2024-11-23 ENCOUNTER — Ambulatory Visit: Admitting: Internal Medicine

## 2024-12-05 ENCOUNTER — Other Ambulatory Visit: Payer: Self-pay | Admitting: Medical Genetics

## 2024-12-05 ENCOUNTER — Encounter: Payer: Self-pay | Admitting: *Deleted

## 2024-12-05 NOTE — Progress Notes (Signed)
 [name]                                          MRN: [id number]   12/05/2024   The [facility] Quality Team Specialist reviewed this patient medical record for the purposes of chart review for care gap closure. The following were reviewed: chart review for care gap closure-glycemic status assessment and kidney health evaluation for diabetes:eGFR  and uACR.    [facility] Quality Team

## 2024-12-24 ENCOUNTER — Other Ambulatory Visit: Payer: Self-pay | Admitting: Internal Medicine

## 2025-01-02 ENCOUNTER — Ambulatory Visit: Admitting: Internal Medicine

## 2025-01-29 ENCOUNTER — Ambulatory Visit: Admitting: Internal Medicine

## 2025-10-02 ENCOUNTER — Encounter: Admitting: Internal Medicine

## 2025-10-02 ENCOUNTER — Ambulatory Visit
